# Patient Record
Sex: Female | Born: 1945
Health system: Southern US, Community
[De-identification: ages and names within clinical notes are randomized; demographics above are authoritative.]

## PROBLEM LIST (undated history)

## (undated) DIAGNOSIS — E119 Type 2 diabetes mellitus without complications: Secondary | ICD-10-CM

## (undated) DIAGNOSIS — G4700 Insomnia, unspecified: Secondary | ICD-10-CM

## (undated) DIAGNOSIS — Z87442 Personal history of urinary calculi: Secondary | ICD-10-CM

## (undated) DIAGNOSIS — M1990 Unspecified osteoarthritis, unspecified site: Secondary | ICD-10-CM

## (undated) DIAGNOSIS — D649 Anemia, unspecified: Secondary | ICD-10-CM

## (undated) DIAGNOSIS — A809 Acute poliomyelitis, unspecified: Secondary | ICD-10-CM

## (undated) DIAGNOSIS — G4730 Sleep apnea, unspecified: Secondary | ICD-10-CM

## (undated) DIAGNOSIS — I10 Essential (primary) hypertension: Secondary | ICD-10-CM

## (undated) DIAGNOSIS — M810 Age-related osteoporosis without current pathological fracture: Secondary | ICD-10-CM

## (undated) HISTORY — DX: Essential (primary) hypertension: I10

## (undated) HISTORY — DX: Insomnia, unspecified: G47.00

## (undated) HISTORY — DX: Age-related osteoporosis without current pathological fracture: M81.0

## (undated) HISTORY — DX: Anemia, unspecified: D64.9

## (undated) HISTORY — DX: Type 2 diabetes mellitus without complications: E11.9

## (undated) HISTORY — DX: Acute poliomyelitis, unspecified: A80.9

---

## 2001-01-04 ENCOUNTER — Other Ambulatory Visit: Admission: RE | Admit: 2001-01-04 | Discharge: 2001-01-04 | Payer: Self-pay | Admitting: *Deleted

## 2001-07-15 ENCOUNTER — Emergency Department (HOSPITAL_COMMUNITY): Admission: EM | Admit: 2001-07-15 | Discharge: 2001-07-15 | Payer: Self-pay | Admitting: Emergency Medicine

## 2003-03-29 ENCOUNTER — Other Ambulatory Visit: Admission: RE | Admit: 2003-03-29 | Discharge: 2003-03-29 | Payer: Self-pay | Admitting: Gynecology

## 2003-04-15 ENCOUNTER — Encounter: Payer: Self-pay | Admitting: Gynecology

## 2003-04-15 ENCOUNTER — Ambulatory Visit (HOSPITAL_COMMUNITY): Admission: RE | Admit: 2003-04-15 | Discharge: 2003-04-15 | Payer: Self-pay | Admitting: Gynecology

## 2003-04-24 ENCOUNTER — Encounter: Payer: Self-pay | Admitting: Gynecology

## 2003-04-24 ENCOUNTER — Ambulatory Visit (HOSPITAL_COMMUNITY): Admission: RE | Admit: 2003-04-24 | Discharge: 2003-04-24 | Payer: Self-pay | Admitting: Gynecology

## 2003-07-04 ENCOUNTER — Ambulatory Visit (HOSPITAL_COMMUNITY): Admission: RE | Admit: 2003-07-04 | Discharge: 2003-07-04 | Payer: Self-pay | Admitting: Oncology

## 2003-07-18 ENCOUNTER — Ambulatory Visit (HOSPITAL_COMMUNITY): Admission: RE | Admit: 2003-07-18 | Discharge: 2003-07-18 | Payer: Self-pay | Admitting: Oncology

## 2004-04-02 ENCOUNTER — Other Ambulatory Visit: Admission: RE | Admit: 2004-04-02 | Discharge: 2004-04-02 | Payer: Self-pay | Admitting: Gynecology

## 2005-03-25 ENCOUNTER — Emergency Department (HOSPITAL_COMMUNITY): Admission: EM | Admit: 2005-03-25 | Discharge: 2005-03-25 | Payer: Self-pay | Admitting: Emergency Medicine

## 2006-07-02 ENCOUNTER — Ambulatory Visit (HOSPITAL_COMMUNITY): Admission: RE | Admit: 2006-07-02 | Discharge: 2006-07-02 | Payer: Self-pay | Admitting: Neurosurgery

## 2006-07-12 ENCOUNTER — Ambulatory Visit (HOSPITAL_COMMUNITY): Admission: RE | Admit: 2006-07-12 | Discharge: 2006-07-12 | Payer: Self-pay | Admitting: Neurosurgery

## 2006-10-25 ENCOUNTER — Encounter: Admission: RE | Admit: 2006-10-25 | Discharge: 2006-10-25 | Payer: Self-pay | Admitting: Neurosurgery

## 2008-07-20 HISTORY — PX: SHOULDER SURGERY: SHX246

## 2010-08-10 ENCOUNTER — Encounter: Payer: Self-pay | Admitting: Neurosurgery

## 2010-08-10 ENCOUNTER — Encounter: Payer: Self-pay | Admitting: Oncology

## 2014-03-01 ENCOUNTER — Other Ambulatory Visit: Payer: Self-pay | Admitting: Vascular Surgery

## 2014-03-01 ENCOUNTER — Encounter: Payer: Self-pay | Admitting: Vascular Surgery

## 2014-03-01 DIAGNOSIS — M7989 Other specified soft tissue disorders: Secondary | ICD-10-CM

## 2014-03-12 ENCOUNTER — Encounter: Payer: Self-pay | Admitting: Vascular Surgery

## 2014-03-13 ENCOUNTER — Ambulatory Visit (INDEPENDENT_AMBULATORY_CARE_PROVIDER_SITE_OTHER): Payer: Medicare Other | Admitting: Vascular Surgery

## 2014-03-13 ENCOUNTER — Ambulatory Visit (HOSPITAL_COMMUNITY)
Admission: RE | Admit: 2014-03-13 | Discharge: 2014-03-13 | Disposition: A | Discharge status: Home / Self Care | Payer: Medicare Other | Source: Ambulatory Visit | Attending: Vascular Surgery | Admitting: Vascular Surgery

## 2014-03-13 ENCOUNTER — Encounter: Payer: Self-pay | Admitting: Vascular Surgery

## 2014-03-13 VITALS — BP 128/72 | HR 59 | Resp 16 | Ht 60.0 in | Wt 154.7 lb

## 2014-03-13 DIAGNOSIS — M7989 Other specified soft tissue disorders: Secondary | ICD-10-CM

## 2014-03-13 DIAGNOSIS — R609 Edema, unspecified: Secondary | ICD-10-CM | POA: Insufficient documentation

## 2014-03-13 NOTE — Progress Notes (Signed)
Patient name: Jodi [name] MRN: [id number] DOB: Dec 15, 1945 Sex: female   Referred by: Jimmye Norman  Reason for referral:  Chief Complaint  Patient presents with  . New Evaluation    c/o left leg swelling for 1 month, c/o nocturnal left leg cramping ,  c/o left leg heaviness during the day  , spanish interpreter in room  assisting  with communication      HISTORY OF PRESENT ILLNESS: Patient returns today for dilation of swelling in her left foot. She does not speak much English but we do have an interpreter here today to help with her visit. She has a picture on her cell phone of her left foot several weeks ago. She does have some erythema on the dorsum and significant swelling in her foot specifically at that time. She denies any swelling in her calf and ankle orthotic. This has resolved most part she does report occasional pain in her arch her upper left foot as well. She has a separate issue regarding nocturnal leg cramps. She does not have any history of lower external ureter insufficiency. She denies claudication type symptoms and no history of arterial rest pain. No history of tissue loss.  Past Medical History  Diagnosis Date  . Hypertension   . Anemia   . Diabetes mellitus without complication   . Insomnia     Past Surgical History  Procedure Laterality Date  . Cesarean section    . Shoulder surgery Left 2010    History   Social History  . Marital Status: Single    Spouse Name: N/A    Number of Children: N/A  . Years of Education: N/A   Occupational History  . Not on file.   Social History Main Topics  . Smoking status: Never Smoker   . Smokeless tobacco: Not on file  . Alcohol Use: No  . Drug Use: No  . Sexual Activity: Not on file   Other Topics Concern  . Not on file   Social History Narrative  . No narrative on file    History reviewed. No pertinent family history.  Allergies as of 03/13/2014 - Review Complete 03/13/2014  Allergen Reaction Noted    . Cortisone  03/13/2014  . Pork-derived products  03/13/2014  . Shrimp [shellfish allergy]  03/13/2014    Current Outpatient Prescriptions on File Prior to Visit  Medication Sig Dispense Refill  . glipiZIDE (GLUCOTROL XL) 2.5 MG 24 hr tablet Take 2.5 mg by mouth daily with breakfast.      . lisinopril (PRINIVIL,ZESTRIL) 5 MG tablet Take 5 mg by mouth daily.      Marland Kitchen escitalopram (LEXAPRO) 10 MG tablet Take 10 mg by mouth daily.       No current facility-administered medications on file prior to visit.     REVIEW OF SYSTEMS:  Positives indicated with an "X"  CARDIOVASCULAR:  [ ]  chest pain   [ ]  chest pressure   [ ]  palpitations   [ ]  orthopnea   [ ]  dyspnea on exertion   [ ]  claudication   [ ]  rest pain   [ ]  DVT   [ ]  phlebitis PULMONARY:   [ ]  productive cough   [ ]  asthma   [ ]  wheezing NEUROLOGIC:   [ ]  weakness  [ ]  paresthesias  [ ]  aphasia  [ ]  amaurosis  [ ]  dizziness HEMATOLOGIC:   [ ]  bleeding problems   [ ]  clotting disorders MUSCULOSKELETAL:  [ ]  joint  pain   [ ]  joint swelling GASTROINTESTINAL: [ ]   blood in stool  [ ]   hematemesis GENITOURINARY:  [ ]   dysuria  [ ]   hematuria PSYCHIATRIC:  [ ]  history of major depression INTEGUMENTARY:  [ ]  rashes  [ ]  ulcers CONSTITUTIONAL:  [ ]  fever   [ ]  chills  PHYSICAL EXAMINATION:  General: The patient is a well-nourished female, in no acute distress. Vital signs are BP 128/72  Pulse 59  Resp 16  Ht 5' (1.524 m)  Wt 154 lb 11.2 oz (70.171 kg)  BMI 30.21 kg/m2 Pulmonary: There is a good air exchange   Musculoskeletal: There are no major deformities.  There is no significant extremity pain. Neurologic: No focal weakness or paresthesias are detected, Skin: There are no ulcer or rashes noted. Psychiatric: The patient has normal affect. Cardiovascular: 2+ dorsalis pedis pulses bilaterally Scattered reticular veins bilaterally but no true varicosities. No swelling or changes of venous hypertension   [facility]  Studies:  Ordered and Independently Reviewed no evidence of deep venous reflux. There is slight reflux of the left saphenofemoral junction but no reflux throughout the saphenous vein  Impression and Plan:  Discuss these findings through the interpreter. She has no evidence of any arterial insufficiency and no evidence of venous insufficiency or hypertension. The swelling and specifically in her foot only. I suspect this is more related to orthopedic issues. This has resolved to a great degree for her. I explained if she should have persistent discomfort would recommend orthopedic evaluation for further care. She was relieved with this discussion and will see [place] again on an as-needed basis    [facility] Office: [phone]

## 2014-07-02 ENCOUNTER — Other Ambulatory Visit: Payer: Self-pay | Admitting: Gastroenterology

## 2014-07-02 DIAGNOSIS — R059 Cough, unspecified: Secondary | ICD-10-CM

## 2014-07-02 DIAGNOSIS — K449 Diaphragmatic hernia without obstruction or gangrene: Secondary | ICD-10-CM

## 2014-07-02 DIAGNOSIS — R05 Cough: Secondary | ICD-10-CM

## 2014-07-05 ENCOUNTER — Ambulatory Visit
Admission: RE | Admit: 2014-07-05 | Discharge: 2014-07-05 | Disposition: A | Discharge status: Home / Self Care | Payer: Medicare Other | Source: Ambulatory Visit | Attending: Gastroenterology | Admitting: Gastroenterology

## 2014-07-05 DIAGNOSIS — K449 Diaphragmatic hernia without obstruction or gangrene: Secondary | ICD-10-CM

## 2014-07-05 DIAGNOSIS — R059 Cough, unspecified: Secondary | ICD-10-CM

## 2014-07-05 DIAGNOSIS — R05 Cough: Secondary | ICD-10-CM

## 2014-09-11 DIAGNOSIS — K449 Diaphragmatic hernia without obstruction or gangrene: Secondary | ICD-10-CM | POA: Diagnosis not present

## 2014-09-11 DIAGNOSIS — K219 Gastro-esophageal reflux disease without esophagitis: Secondary | ICD-10-CM | POA: Diagnosis not present

## 2014-10-08 DIAGNOSIS — R739 Hyperglycemia, unspecified: Secondary | ICD-10-CM | POA: Diagnosis not present

## 2014-10-08 DIAGNOSIS — E785 Hyperlipidemia, unspecified: Secondary | ICD-10-CM | POA: Diagnosis not present

## 2014-10-08 DIAGNOSIS — E119 Type 2 diabetes mellitus without complications: Secondary | ICD-10-CM | POA: Diagnosis not present

## 2014-10-08 DIAGNOSIS — R32 Unspecified urinary incontinence: Secondary | ICD-10-CM | POA: Diagnosis not present

## 2014-10-08 DIAGNOSIS — K449 Diaphragmatic hernia without obstruction or gangrene: Secondary | ICD-10-CM | POA: Diagnosis not present

## 2014-10-08 DIAGNOSIS — I10 Essential (primary) hypertension: Secondary | ICD-10-CM | POA: Diagnosis not present

## 2014-11-26 DIAGNOSIS — E119 Type 2 diabetes mellitus without complications: Secondary | ICD-10-CM | POA: Diagnosis not present

## 2014-11-26 DIAGNOSIS — K449 Diaphragmatic hernia without obstruction or gangrene: Secondary | ICD-10-CM | POA: Diagnosis not present

## 2014-11-26 DIAGNOSIS — I10 Essential (primary) hypertension: Secondary | ICD-10-CM | POA: Diagnosis not present

## 2014-11-26 DIAGNOSIS — R739 Hyperglycemia, unspecified: Secondary | ICD-10-CM | POA: Diagnosis not present

## 2014-11-26 DIAGNOSIS — R32 Unspecified urinary incontinence: Secondary | ICD-10-CM | POA: Diagnosis not present

## 2014-11-26 DIAGNOSIS — E785 Hyperlipidemia, unspecified: Secondary | ICD-10-CM | POA: Diagnosis not present

## 2014-11-26 DIAGNOSIS — R252 Cramp and spasm: Secondary | ICD-10-CM | POA: Diagnosis not present

## 2014-12-27 DIAGNOSIS — Z1231 Encounter for screening mammogram for malignant neoplasm of breast: Secondary | ICD-10-CM | POA: Diagnosis not present

## 2015-01-01 DIAGNOSIS — R5382 Chronic fatigue, unspecified: Secondary | ICD-10-CM | POA: Diagnosis not present

## 2015-01-01 DIAGNOSIS — I10 Essential (primary) hypertension: Secondary | ICD-10-CM | POA: Diagnosis not present

## 2015-01-01 DIAGNOSIS — E785 Hyperlipidemia, unspecified: Secondary | ICD-10-CM | POA: Diagnosis not present

## 2015-01-01 DIAGNOSIS — E119 Type 2 diabetes mellitus without complications: Secondary | ICD-10-CM | POA: Diagnosis not present

## 2015-01-10 DIAGNOSIS — R32 Unspecified urinary incontinence: Secondary | ICD-10-CM | POA: Diagnosis not present

## 2015-01-10 DIAGNOSIS — I10 Essential (primary) hypertension: Secondary | ICD-10-CM | POA: Diagnosis not present

## 2015-01-10 DIAGNOSIS — R739 Hyperglycemia, unspecified: Secondary | ICD-10-CM | POA: Diagnosis not present

## 2015-01-10 DIAGNOSIS — E785 Hyperlipidemia, unspecified: Secondary | ICD-10-CM | POA: Diagnosis not present

## 2015-01-10 DIAGNOSIS — K449 Diaphragmatic hernia without obstruction or gangrene: Secondary | ICD-10-CM | POA: Diagnosis not present

## 2015-01-10 DIAGNOSIS — E119 Type 2 diabetes mellitus without complications: Secondary | ICD-10-CM | POA: Diagnosis not present

## 2015-01-10 DIAGNOSIS — R5382 Chronic fatigue, unspecified: Secondary | ICD-10-CM | POA: Diagnosis not present

## 2015-02-04 DIAGNOSIS — M79672 Pain in left foot: Secondary | ICD-10-CM | POA: Diagnosis not present

## 2015-02-04 DIAGNOSIS — M71572 Other bursitis, not elsewhere classified, left ankle and foot: Secondary | ICD-10-CM | POA: Diagnosis not present

## 2015-02-04 DIAGNOSIS — M25572 Pain in left ankle and joints of left foot: Secondary | ICD-10-CM | POA: Diagnosis not present

## 2015-02-04 DIAGNOSIS — M2012 Hallux valgus (acquired), left foot: Secondary | ICD-10-CM | POA: Diagnosis not present

## 2015-02-11 DIAGNOSIS — M7989 Other specified soft tissue disorders: Secondary | ICD-10-CM | POA: Diagnosis not present

## 2015-02-11 DIAGNOSIS — M65872 Other synovitis and tenosynovitis, left ankle and foot: Secondary | ICD-10-CM | POA: Diagnosis not present

## 2015-06-10 DIAGNOSIS — I10 Essential (primary) hypertension: Secondary | ICD-10-CM | POA: Diagnosis not present

## 2015-06-10 DIAGNOSIS — J069 Acute upper respiratory infection, unspecified: Secondary | ICD-10-CM | POA: Diagnosis not present

## 2015-06-10 DIAGNOSIS — E119 Type 2 diabetes mellitus without complications: Secondary | ICD-10-CM | POA: Diagnosis not present

## 2015-06-10 DIAGNOSIS — K219 Gastro-esophageal reflux disease without esophagitis: Secondary | ICD-10-CM | POA: Diagnosis not present

## 2015-06-10 DIAGNOSIS — E785 Hyperlipidemia, unspecified: Secondary | ICD-10-CM | POA: Diagnosis not present

## 2015-08-26 DIAGNOSIS — E785 Hyperlipidemia, unspecified: Secondary | ICD-10-CM | POA: Diagnosis not present

## 2015-08-26 DIAGNOSIS — K449 Diaphragmatic hernia without obstruction or gangrene: Secondary | ICD-10-CM | POA: Diagnosis not present

## 2015-08-26 DIAGNOSIS — E119 Type 2 diabetes mellitus without complications: Secondary | ICD-10-CM | POA: Diagnosis not present

## 2015-08-26 DIAGNOSIS — I10 Essential (primary) hypertension: Secondary | ICD-10-CM | POA: Diagnosis not present

## 2015-11-04 DIAGNOSIS — M24872 Other specific joint derangements of left ankle, not elsewhere classified: Secondary | ICD-10-CM | POA: Diagnosis not present

## 2015-11-04 DIAGNOSIS — M65872 Other synovitis and tenosynovitis, left ankle and foot: Secondary | ICD-10-CM | POA: Diagnosis not present

## 2015-11-04 DIAGNOSIS — M25572 Pain in left ankle and joints of left foot: Secondary | ICD-10-CM | POA: Diagnosis not present

## 2016-04-02 ENCOUNTER — Encounter: Payer: Self-pay | Admitting: Internal Medicine

## 2016-04-02 ENCOUNTER — Ambulatory Visit: Payer: Medicare Other | Attending: Internal Medicine | Admitting: Internal Medicine

## 2016-04-02 ENCOUNTER — Encounter (INDEPENDENT_AMBULATORY_CARE_PROVIDER_SITE_OTHER): Payer: Self-pay

## 2016-04-02 VITALS — BP 144/80 | HR 67 | Temp 97.7°F | Resp 16 | Ht 60.0 in | Wt 142.6 lb

## 2016-04-02 DIAGNOSIS — Z1329 Encounter for screening for other suspected endocrine disorder: Secondary | ICD-10-CM | POA: Insufficient documentation

## 2016-04-02 DIAGNOSIS — I10 Essential (primary) hypertension: Secondary | ICD-10-CM | POA: Insufficient documentation

## 2016-04-02 DIAGNOSIS — Z23 Encounter for immunization: Secondary | ICD-10-CM | POA: Insufficient documentation

## 2016-04-02 DIAGNOSIS — Z118 Encounter for screening for other infectious and parasitic diseases: Secondary | ICD-10-CM | POA: Insufficient documentation

## 2016-04-02 DIAGNOSIS — Z888 Allergy status to other drugs, medicaments and biological substances status: Secondary | ICD-10-CM | POA: Diagnosis not present

## 2016-04-02 DIAGNOSIS — Z79899 Other long term (current) drug therapy: Secondary | ICD-10-CM | POA: Diagnosis not present

## 2016-04-02 DIAGNOSIS — M79672 Pain in left foot: Secondary | ICD-10-CM | POA: Diagnosis not present

## 2016-04-02 DIAGNOSIS — D649 Anemia, unspecified: Secondary | ICD-10-CM | POA: Diagnosis not present

## 2016-04-02 DIAGNOSIS — Z114 Encounter for screening for human immunodeficiency virus [HIV]: Secondary | ICD-10-CM

## 2016-04-02 DIAGNOSIS — Z1159 Encounter for screening for other viral diseases: Secondary | ICD-10-CM

## 2016-04-02 DIAGNOSIS — M545 Low back pain: Secondary | ICD-10-CM | POA: Insufficient documentation

## 2016-04-02 DIAGNOSIS — R252 Cramp and spasm: Secondary | ICD-10-CM | POA: Diagnosis not present

## 2016-04-02 DIAGNOSIS — E119 Type 2 diabetes mellitus without complications: Secondary | ICD-10-CM

## 2016-04-02 DIAGNOSIS — E11610 Type 2 diabetes mellitus with diabetic neuropathic arthropathy: Secondary | ICD-10-CM | POA: Diagnosis not present

## 2016-04-02 LAB — CMP AND LIVER
ALT: 15 U/L (ref 6–29)
AST: 19 U/L (ref 10–35)
Albumin: 3.9 g/dL (ref 3.6–5.1)
Alkaline Phosphatase: 82 U/L (ref 33–130)
BILIRUBIN DIRECT: 0.1 mg/dL (ref <=0.2)
BUN: 17 mg/dL (ref 7–25)
CALCIUM: 9.1 mg/dL (ref 8.6–10.4)
CHLORIDE: 106 mmol/L (ref 98–110)
CO2: 23 mmol/L (ref 20–31)
Creat: 0.64 mg/dL (ref 0.60–0.93)
Glucose, Bld: 105 mg/dL — ABNORMAL HIGH (ref 65–99)
Indirect Bilirubin: 0.3 mg/dL (ref 0.2–1.2)
POTASSIUM: 4.5 mmol/L (ref 3.5–5.3)
Sodium: 139 mmol/L (ref 135–146)
Total Bilirubin: 0.4 mg/dL (ref 0.2–1.2)
Total Protein: 7.1 g/dL (ref 6.1–8.1)

## 2016-04-02 LAB — CBC WITH DIFFERENTIAL/PLATELET
Basophils Absolute: 52 cells/uL (ref 0–200)
Basophils Relative: 1 %
Eosinophils Absolute: 104 cells/uL (ref 15–500)
Eosinophils Relative: 2 %
HEMATOCRIT: 23.9 % — AB (ref 35.0–45.0)
Hemoglobin: 6.8 g/dL — ABNORMAL LOW (ref 11.7–15.5)
LYMPHS PCT: 30 %
Lymphs Abs: 1560 cells/uL (ref 850–3900)
MCH: 19.8 pg — ABNORMAL LOW (ref 27.0–33.0)
MCHC: 28.7 g/dL — AB (ref 32.0–36.0)
MCV: 69.5 fL — AB (ref 80.0–100.0)
MONO ABS: 312 {cells}/uL (ref 200–950)
MONOS PCT: 6 %
MPV: 8.8 fL (ref 7.5–12.5)
NEUTROS PCT: 61 %
Neutro Abs: 3172 cells/uL (ref 1500–7800)
PLATELETS: 509 10*3/uL — AB (ref 140–400)
RBC: 3.43 MIL/uL — AB (ref 3.80–5.10)
RDW: 19.3 % — AB (ref 11.0–15.0)
WBC: 5.2 10*3/uL (ref 3.8–10.8)

## 2016-04-02 LAB — LIPID PANEL
CHOL/HDL RATIO: 2.8 ratio (ref <=5.0)
Cholesterol: 205 mg/dL — ABNORMAL HIGH (ref 125–200)
HDL: 74 mg/dL (ref >=46)
LDL Cholesterol: 110 mg/dL (ref <130)
Triglycerides: 106 mg/dL (ref <150)
VLDL: 21 mg/dL (ref <30)

## 2016-04-02 LAB — TSH: TSH: 0.79 m[IU]/L

## 2016-04-02 LAB — POCT GLYCOSYLATED HEMOGLOBIN (HGB A1C): Hemoglobin A1C: 6.1

## 2016-04-02 LAB — GLUCOSE, POCT (MANUAL RESULT ENTRY): POC Glucose: 113 mg/dl — AB (ref 70–99)

## 2016-04-02 MED ORDER — LUMBAR BACK BRACE/SUPPORT PAD MISC: 1.0000 "application " | Freq: Every day | 0 refills | Status: DC

## 2016-04-02 MED ORDER — SOLIFENACIN SUCCINATE 10 MG PO TABS: 10.0000 mg | ORAL_TABLET | Freq: Every day | ORAL | 2 refills | Status: DC

## 2016-04-02 MED ORDER — GABAPENTIN 100 MG PO CAPS: 100.0000 mg | ORAL_CAPSULE | Freq: Every day | ORAL | 3 refills | Status: DC

## 2016-04-02 MED ORDER — IBUPROFEN 800 MG PO TABS: 800.0000 mg | ORAL_TABLET | Freq: Three times a day (TID) | ORAL | 1 refills | Status: DC | PRN

## 2016-04-02 MED ORDER — METFORMIN HCL 500 MG PO TABS: 500.0000 mg | ORAL_TABLET | Freq: Two times a day (BID) | ORAL | 3 refills | Status: DC

## 2016-04-02 MED ORDER — VALSARTAN 40 MG PO TABS: 40.0000 mg | ORAL_TABLET | Freq: Every day | ORAL | 2 refills | Status: DC

## 2016-04-02 NOTE — Progress Notes (Signed)
Jodi [name], is a 70 y.o. female  [id number]  [id number]  DOB - 04-06-1946  CC:  Chief Complaint  Patient presents with  . Establish Care       HPI: Jodi [name] is a 70 y.o. female here today to establish medical care, new to our clinic, w/ hx of htn, anemia, dm, insomnia, chronic back pain. She use to go to [facility] [place] in [place] clinic, but they have since closed.  She is doing well, no acute c/o, accept her chronic lower back pain for which she requested a lumbar support waist belt and renew rx for ibuprofen 800 for which she takes prn for it.  Does not smoke or drink. Never recalls having any recent immunizations.  Currently fasting. Never had eye exam prior.  She use to see foot doctor who gave her steroid injection on her foot, which helped with the left foot pain, but she does c/o of most nights having severe bilat leg cramps which are very painful.  Patient has No headache, No chest pain, No abdominal pain - No Nausea, No new weakness tingling or numbness, No Cough - SOB.  Interpreter was used to communicate directly with patient for the entire encounter including providing detailed patient instructions.    Review of Systems: Per HPI, o/w all systems reviewed and negative.  Allergies  Allergen Reactions  . Cortisone     Rash, swelling  . Pork-Derived Products     Rash, joint pain  . Shrimp [Shellfish Allergy]     Rash, joint pain, GI pain   Past Medical History:  Diagnosis Date  . Anemia   . Diabetes mellitus without complication ([place])   . Hypertension   . Insomnia    Current Outpatient Prescriptions on File Prior to Visit  Medication Sig Dispense Refill  . dexlansoprazole (DEXILANT) 60 MG capsule Take 60 mg by mouth daily.    . bisoprolol-hydrochlorothiazide (ZIAC) 5-6.25 MG per tablet Take 1 tablet by mouth daily.    Marland Kitchen escitalopram (LEXAPRO) 10 MG tablet Take 10 mg by mouth daily.    Marland Kitchen glipiZIDE (GLUCOTROL XL) 2.5 MG 24 hr tablet  Take 2.5 mg by mouth daily with breakfast.    . Iron-FA-B Cmp-C-Biot-Probiotic (FUSION PLUS) CAPS Take by mouth daily.    Marland Kitchen VITAMIN B1-B12 IJ Inject as directed once a week.     No current facility-administered medications on file prior to visit.    No family history on file. Social History   Social History  . Marital status: Single    Spouse name: N/A  . Number of children: N/A  . Years of education: N/A   Occupational History  . Not on file.   Social History Main Topics  . Smoking status: Never Smoker  . Smokeless tobacco: Not on file  . Alcohol use No  . Drug use: No  . Sexual activity: Not on file   Other Topics Concern  . Not on file   Social History Narrative  . No narrative on file    Objective:   Vitals:   04/02/16 1006  BP: (!) 144/80  Pulse: 67  Resp: 16  Temp: 97.7 F (36.5 C)    Filed Weights   04/02/16 1006  Weight: 142 lb 9.6 oz (64.7 kg)    BP Readings from Last 3 Encounters:  04/02/16 (!) 144/80  03/13/14 128/72    Physical Exam: Constitutional: Patient appears well-developed and well-nourished. No distress. AAOx3, pleasant. HENT: Normocephalic, atraumatic, External right and left ear  normal. Oropharynx is clear and moist. bilat TMs clear. Eyes: Conjunctivae and EOM are normal. PERRL, no scleral icterus. Neck: Normal ROM. Neck supple. No JVD.  CVS: RRR, S1/S2 +, no murmurs, no gallops, no carotid bruit.  Pulmonary: Effort and breath sounds normal, no stridor, rhonchi, wheezes, rales.  Abdominal: Soft. BS +, no distension, tenderness, rebound or guarding.  Musculoskeletal: Normal range of motion. No edema and no tenderness.  Foot exam: bilateral peripheral pulses 2+ (dorsalis pedis and post tibialis pulses), no ulcers noted/no ecchymosis, warm to touch, decrease sensation bilaterally, left foot > right. No c/c/e. Neuro: Alert. muscle tone coordination wnl. No cranial nerve deficit grossly. Skin: Skin is warm and dry. No rash noted. Not  diaphoretic. No erythema. No pallor. Psychiatric: Normal mood and affect. Behavior, judgment, thought content normal.  No results found for: WBC, HGB, HCT, MCV, PLT No results found for: CREATININE, BUN, NA, K, CL, CO2  Lab Results  Component Value Date   HGBA1C 6.1 04/02/2016   Lipid Panel  No results found for: CHOL, TRIG, HDL, CHOLHDL, VLDL, LDLCALC     Depression screen [facility] 2/9 04/02/2016  Decreased Interest 3  Down, Depressed, Hopeless 3  PHQ - 2 Score 6  Altered sleeping 3  Tired, decreased energy 3  Change in appetite 2  Feeling bad or failure about yourself  3  Trouble concentrating 3  Moving slowly or fidgety/restless 2  Suicidal thoughts 0  PHQ-9 Score 22    Assessment and plan:   1. Type 2 diabetes mellitus without complication, without long-term current use of insulin (HCC) - currently in Predm range,  - Glucose (CBG) 113 - HgB A1c 6.1 - CMP and Liver - TSH - Ambulatory referral to Ophthalmology  2. Flu vaccine need - Flu Vaccine QUAD 36+ mos PF IM (Fluarix & Fluzone Quad PF)  3. HTN (hypertension), benign Meeting goals for current guidelines,  <150/90 - renewed diovan 40qd - Lipid Panel - CBC with Differential  4. Need for hepatitis C screening test - Hepatitis C antibody  5. Thyroid disorder screen - TSH  6. Diabetic neuropathic arthritis ([place]) ? W/ co of muscle cramps at night - trial neurontin 100qsh - chk electrolytes /cmp  - chk TSH  7. Encounter for screening for HIV - HIV antibody (with reflex)  8. Midline low back pain, with sciatica presence unspecified Suspect back strain/lordosis, exercise/back stretching exercises provided - encouraged to take motrin 800 prn w/ food - back brace rx provided as well.  9. tdap and pneumovac 23v today as well.  Return in about 3 months (around 07/02/2016).  The patient was given clear instructions to go to ER or return to medical center if symptoms don't improve, worsen or new problems  develop. The patient verbalized understanding. The patient was told to call to get lab results if they haven't heard anything in the next week.    This note has been created with Surveyor, quantity. Any transcriptional errors are unintentional.   Maren Reamer, MD, [facility], [place]   04/02/2016, 12:37 PM

## 2016-04-02 NOTE — Progress Notes (Signed)
Pt is in the office today for establish care Pt states she is not in any pain Pt states she feels tired Pt states she went to see an orthopedic doctor and they told her she has something wrong with her back and that's why she has back pain

## 2016-04-02 NOTE — Patient Instructions (Addendum)
La diabetes mellitus y los alimentos (Diabetes Mellitus and Food) Es importante que controle su nivel de azcar en la sangre (glucosa). El nivel de glucosa en sangre depende en gran medida de lo que usted come. Comer alimentos saludables en las cantidades [place] a lo largo del Training and development officer, aproximadamente a la misma hora [employer], lo ayudar a Chief Technology Officer su nivel de Multimedia programmer. Tambin puede ayudarlo a retrasar o Patent attorney de la diabetes mellitus. Comer de [employer] saludable incluso puede ayudarlo a Chartered loss adjuster de presin arterial y a Science writer o Theatre manager un peso saludable.  Entre las recomendaciones generales para alimentarse y Audiological scientist los alimentos de forma saludable, se incluyen las siguientes:  Respetar las comidas principales y comer colaciones con regularidad. Evitar pasar largos perodos sin comer con el fin de perder peso.  Seguir una dieta que consista principalmente en alimentos de origen vegetal, como frutas, vegetales, frutos secos, legumbres y cereales integrales.  Utilizar mtodos de coccin a baja temperatura, como hornear, en lugar de mtodos de coccin a alta temperatura, como frer en abundante aceite. Trabaje con el nutricionista para aprender a Financial planner nutricional de las etiquetas de los alimentos. CMO PUEDEN AFECTARME LOS ALIMENTOS? Carbohidratos Los carbohidratos afectan el nivel de glucosa en sangre ms que cualquier otro tipo de alimento. El nutricionista lo ayudar a Teacher, adult education cuntos carbohidratos puede consumir en cada comida y ensearle a contarlos. El recuento de carbohidratos es importante para mantener la glucosa en sangre en un nivel saludable, en especial si utiliza insulina o toma determinados medicamentos para la diabetes mellitus. Alcohol El alcohol puede provocar disminuciones sbitas de la glucosa en sangre (hipoglucemia), en especial si utiliza insulina o toma determinados medicamentos para la diabetes mellitus. La  hipoglucemia es una afeccin que puede poner en peligro la vida. Los sntomas de la hipoglucemia (somnolencia, mareos y Data processing manager) son similares a los sntomas de haber consumido mucho alcohol.  Si el mdico lo autoriza a beber alcohol, hgalo con moderacin y siga estas pautas:  Las mujeres no deben beber ms de un trago por da, y los hombres no deben beber ms de dos tragos por Training and development officer. Un trago es igual a:  12 onzas (355 ml) de cerveza  5 onzas de vino (150 ml) de vino  1,5onzas (23m) de bebidas espirituosas  No beba con el estmago vaco.  Mantngase hidratado. Beba agua, gaseosas dietticas o t helado sin azcar.  Las gaseosas comunes, los jugos y otros refrescos podran contener muchos carbohidratos y se dCivil Service fast streamer QU ALIMENTOS NO SE RECOMIENDAN? Cuando haga las elecciones de alimentos, es importante que recuerde que todos los alimentos son distintos. Algunos tienen menos nutrientes que otros por porcin, aunque podran tener la misma cantidad de caloras o carbohidratos. Es difcil darle al cuerpo lo que necesita cuando consume alimentos con menos nutrientes. Estos son algunos ejemplos de alimentos que debera evitar ya que contienen muchas caloras y carbohidratos, pero pocos nutrientes:  GPhysicist, medicaltrans (la mayora de los alimentos procesados incluyen grasas trans en la etiqueta de Informacin nutricional).  Gaseosas comunes.  Jugos.  Caramelos.  Dulces, como tortas, pasteles, rosquillas y g[place]  Comidas fritas. QU ALIMENTOS PUEDO COMER? Consuma alimentos ricos en nutrientes, que nutrirn el cuerpo y lo mantendrn saludable. Los alimentos que debe comer tambin dependern de varios factores, como:  Las caloras que necesita.  Los medicamentos que toma.  Su peso.  El nivel de glucosa en sMarist College  El nArrow Rockde presin arterial.  El nivel de colesterol.  Debe consumir una amplia variedad de alimentos, por ejemplo:  Protenas.  Cortes de [employer].  Protenas con bajo contenido de grasas saturadas, como pescado, clara de huevo y frijoles. Evite las carnes procesadas.  Frutas y vegetales.  Frutas y Photographer que pueden ayudar a Chief Technology Officer los niveles sanguneos de [place], como [place], mangos y batatas.  Productos lcteos.  Elija productos lcteos sin grasa o con bajo contenido de [place], como [place], yogur y [place].  Cereales, panes, pastas y arroz.  Elija cereales integrales, como panes multicereales, avena en grano y arroz integral. Estos alimentos pueden ayudar a controlar la presin arterial.  Daphene Jaeger.  Alimentos que contengan grasas saludables, como frutos secos, Musician, aceite de [place], aceite de canola y pescado. TODOS LOS QUE PADECEN DIABETES MELLITUS TIENEN EL [place] PLAN DE [place]? Dado que todas las personas que padecen diabetes mellitus son distintas, no hay un solo plan de comidas que funcione para todos. Es muy importante que se rena con un nutricionista que lo ayudar a crear un plan de comidas adecuado para usted.   Esta informacin no tiene Marine scientist el consejo del mdico. Asegrese de hacerle al mdico cualquier pregunta que tenga.   Document Released: 10/13/2007 Document Revised: 07/27/2014 Elsevier Interactive Patient Education 2016 [place] para comer fuera de su casa si tiene diabetes (Tips for Eating Away From Home If You Have Diabetes) El control del nivel de glucemia, que tambin se conoce como azcar en la [place], puede ser un reto, que se complica an ms cuando uno no prepara sus propias comidas. Los siguientes consejos pueden ayudarlo a Chief Technology Officer la diabetes cuando come fuera de su casa. PLANIFICACIN Organcese si sabe que comer fuera de su casa:  Pregntele al mdico cmo sincronizar las comidas y el medicamento si est en tratamiento con insulina.  Haga una lista de restaurantes cercanos que ofrezcan opciones saludables. Si tienen un men que pueda leer en  su casa, llvelo y planifique lo que pedir con anticipacin.  Busque informacin en lnea del restaurante donde quiera comer. Muchos restaurantes de comida rpida y cadenas de restaurantes incluyen la informacin nutricional en lnea. Tenga en cuenta esta informacin para elegir las opciones ms saludables y calcular los carbohidratos de la comida.  Use un libro de recuento de carbohidratos o una aplicacin mvil para fijarse en el contenido de carbohidratos y el tamao de porcin de lo que desea comer.  Comience a Corporate treasurer de las porciones y a Marine scientist cuntas porciones hay en una unidad. Esto le permitir calcular la cantidad de carbohidratos que puede comer. ALIMENTOS LIBRES Un "alimento libre" es cualquier alimento o bebida que contenga menos de 5g de carbohidratos por porcin. Entre los alimentos libres, se incluyen los siguientes:  Muchos vegetales.  Huevos duros.  Nueces o semillas.  Aceitunas.  Quesos.  Carnes. Estos tipos de alimentos son buenas opciones de bocadillos y en general estn disponibles en los bufs de ensaladas. Como aderezos "libres" para [place], puede usarse jugo de limn, vinagre o un aderezo de bajas caloras (con menos de 20caloras por porcin).  OPCIONES PARA REDUCIR LOS CARBOHIDRATOS  Reemplace el yogur descremado endulzado por el yogur sin azcar. Tambin puede consumir yogur a base de [place] de [place], pero es conveniente una opcin sin azcar o natural, porque tiene menos contenido de carbohidratos.  Pdale al mozo que retire la canasta de pan o las papas de la mesa.  Pida frutas frescas. El buf de ensaladas a menudo ofrece frutas  frescas. Evite las frutas enlatadas, ya que por lo general tienen azcar o almbar.  Pida una ensalada y cmala sin aderezo. Tambin puede crear un aderezo "libre" para ensaladas.  Pida que le [employer] alimentos. Por ejemplo, en lugar de papas fritas, pida una porcin de vegetales, como una ensalada,  judas verdes o brcoli. OTROS CONSEJOS   Si [place] insulina, adminstrela una vez que la comida llegue a la mesa, as las Hotel manager.  Pregntele al mozo sobre el tamao de la porcin antes de pedir la comida y, si la porcin es ms grande de lo que usted debe consumir, pida una caja para llevarse la comida a su casa. Cuando llegue la comida, deje en el plato la cantidad que debe comer y coloque el resto en la caja para llevar.  Considere la posibilidad de Publishing rights manager un plato principal con alguien y de pedir una ensalada como guarnicin.   Esta informacin no tiene Marine scientist el consejo del mdico. Asegrese de hacerle al mdico cualquier pregunta que tenga.   Document Released: 07/06/2005 Document Revised: 03/27/2015 Elsevier Interactive Patient Education [employer].  -  Diabetes y actividad fsica (Diabetes and Exercise) Hacer actividad fsica con regularidad es muy importante. No se trata solo de [employer]. Tiene muchos otros beneficios, como por ejemplo:  Mejorar el estado fsico, la flexibilidad y la resistencia.  Aumenta la densidad sea.  Ayuda a Technical sales engineer.  Disminuye la Air traffic controller.  Aumenta la fuerza muscular.  Reduce el estrs y las tensiones.  Mejora el estado de salud general. Las personas diabticas que realizan actividad fsica tienen beneficios adicionales debido al ejercicio:  Reduce el apetito.  El organismo mejora el uso del azcar (glucosa) de la [place].  Ayuda a disminuir o Product/process development scientist.  Disminuye la presin arterial.  Ayuda a disminuir los lpidos en la sangre (colesterol y triglicridos).  El organismo mejora el uso de la insulina porque:  Aumenta la sensibilidad del organismo a la insulina.  Reduce las necesidades de insulina del organismo.  Disminuye el riesgo de enfermedad cardaca por la actividad fsica ya que  disminuye el colesterol y [employer] triglicridos.  Aumenta  los niveles de colesterol bueno (como las lipoprotenas de alta densidad [HDL]) en el organismo.  Disminuye los niveles de glucosa en la [place]. SU PLAN DE ACTIVIDAD  Elija una actividad que disfrute y establezca objetivos realistas. Para ejercitarse sin riesgos, debe comenzar a Psychologist, prison and probation services cualquier actividad fsica nueva lentamente y aumentar la intensidad del ejercicio de forma gradual con el tiempo. Su mdico o educador en diabetes podrn ayudarlo a crear un plan de actividades que lo beneficie. Las recomendaciones generales incluyen lo siguiente:  Psychologist, clinical a los nios para que realicen al menos 60 minutos de actividad fsica Armed forces operational officer.  Estirarse y Optometrist ejercicios de entrenamiento de la fuerza, como yoga o levantamiento de pesas, por lo menos 2 veces por semana.  Realizar en total por lo menos 150 minutos de ejercicios de intensidad moderada cada semana, como caminar a paso ligero o hacer gimnasia acutica.  Hacer ejercicio fsico por lo menos 3 das por semana y no dejar pasar ms de 2 das seguidos sin ejercitarse.  Evitar los perodos largos de inactividad (90 minutos o ms tiempo). Cuando deba pasar mucho tiempo sentado, haga pausas frecuentes para caminar o estirarse. RECOMENDACIONES PARA REALIZAR EJERCICIOS CUANDO SE TIENE DIABETES TIPO 1 O TIPO 2   Controle la glucosa en la sangre antes de comenzar.  Si el nivel de glucosa en la sangre es de ms de 240 mg/dl, controle las cetonas en la [place]. No haga actividad fsica si hay cetonas.  Evite inyectarse insulina en las zonas del cuerpo que ejercitar. Por ejemplo, evite inyectarse insulina en:  Los brazos, si juega al tenis.  Las piernas, si corre.  Lleve un registro de:  Los alimentos que consume antes y despus de TEFL teacher.  Los momentos esperables de picos de accin de la insulina.  Los niveles de glucosa en la sangre antes y despus de hacer ejercicios.  El tipo y cantidad de [place] fsica que  Musician.  Revise los registros con su mdico. El mdico lo ayudar a Actor pautas para ajustar la cantidad de alimento y las cantidades de insulina antes y despus de Field seismologist ejercicios.  Si toma insulina o agentes hipoglucemiantes por va oral, observe si hay signos y sntomas de hipoglucemia. Entre los que se incluyen:  Mareos.  Temblores.  Sudoracin.  Escalofros.  Confusin.  Beba gran cantidad de agua mientras hace ejercicios para evitar la deshidratacin o los golpes de Freight forwarder. Durante la actividad fsica se pierde agua corporal que se debe reponer.  Comente con su mdico antes de comenzar un programa de actividad fsica para verificar que sea seguro para usted. Recuerde, cualquier actividad es mejor que ninguna.   Esta informacin no tiene Marine scientist el consejo del mdico. Asegrese de hacerle al mdico cualquier pregunta que tenga.   Document Released: 07/26/2007 Document Revised: 11/20/2014 Elsevier Interactive Patient Education 2016 [employer].   -  Hipertensin (Hypertension) El trmino hipertensin es otra forma de denominar a la presin arterial elevada. La presin arterial elevada fuerza al corazn a trabajar ms para bombear la sangre. Una lectura de la presin arterial consta de dos nmeros: uno ms alto sobre uno ms bajo (por ejemplo, 110/72). CUIDADOS EN EL HOGAR   Haga que el mdico le tome nuevamente la presin arterial.  Tome los medicamentos solamente como se lo haya indicado el mdico. Siga cuidadosamente las indicaciones. Los medicamentos pierden eficacia si omite dosis. El hecho de omitir las dosis tambin [place] el riesgo de otros problemas.  No fume.  Contrlese la presin arterial en su casa como se lo haya indicado el mdico. SOLICITE AYUDA SI:  Piensa que tiene una reaccin a los medicamentos que est tomando.  Tiene mareos o dolores de cabeza reiterados.  Se le inflaman (hinchan) los tobillos.  Tiene problemas de  visin. SOLICITE AYUDA DE INMEDIATO SI:   Tiene un dolor de cabeza muy intenso y est confundido.  Se siente dbil, aturdido o se desmaya.  Tiene dolor en el pecho o el estmago (abdominal).  Tiene vmitos.  No puede respirar [employer]. ASEGRESE DE QUE:   Comprende estas instrucciones.  Controlar su afeccin.  Recibir ayuda de inmediato si no mejora o si empeora.   Esta informacin no tiene Marine scientist el consejo del mdico. Asegrese de hacerle al mdico cualquier pregunta que tenga.   Document Released: 12/24/2009 Document Revised: 07/11/2013 Elsevier Interactive Patient Education 2016 [place] muscular. (Muscle Strain) Una distensin muscular (estiramiento muscular) ocurre cuando un msculo se estira ms all de la longitud normal. Mahlon Gammon cuando una fuerza violenta bruscamente estira demasiado el msculo. Generalmente se desgarran algunas de las fibras del msculo. La distensin muscular es comn en los atletas. La recuperacin normalmente tarda de 1 a 2semanas. La curacin completa tarda de 5 a 6semanas.  CUIDADOS EN  EL HOGAR   Siga el mtodo PRICE (por sus siglas en ingls) de tratamiento para que la lesin mejore. Hgalo durante los 2 a 3 primeros das despus de la lesin:  Engineer, maintenance. Proteja el msculo para evitar que se vuelva a lesionar.  Reposo. Limite la actividad y descanse la parte del cuerpo lesionada.  Hielo. Ponga el hielo en una bolsa plstica. Coloque una toalla entre la piel y la bolsa de hielo. Luego aplique el hielo y djelo actuar de 64 a 65minutos por hora. Despus del Building control surveyor, cambie a compresas de calor hmedo.  Compresin. Use una frula o venda elstica en la zona lesionada para brindar alivio. No la ajuste demasiado.  Elevacin. Eleve la zona lesionada por encima del nivel del corazn.  Solo tome los medicamentos que le haya indicado su mdico.  Realice un calentamiento antes de hacer ejercicio para  prevenir distensiones musculares futuras. SOLICITE AYUDA SI:   Siente ms dolor o inflamacin (hinchazn) en la zona lesionada.  Siente adormecimiento, hormigueo o nota una prdida de fuerza en la zona lesionada. ASEGRESE DE QUE:   Comprende estas instrucciones.  Controlar su afeccin.  Recibir ayuda de inmediato si no mejora o si empeora.   Esta informacin no tiene Marine scientist el consejo del mdico. Asegrese de hacerle al mdico cualquier pregunta que tenga.   Document Released: 10/02/2008 Document Revised: 04/26/2013 Elsevier Interactive Patient Education 2016 [place] Tdap (contra la difteria, el ttanos y [place]): Lo que debe saber (Tdap Vaccine [Tetanus, Diphtheria, and Pertussis]: What You Need to Know) 1. Por qu vacunarse? El ttanos, la difteria y la tosferina son enfermedades muy graves. La vacuna Tdap nos puede proteger de estas enfermedades. Adems, la vacuna Tdap que se aplica a las Chemical engineer a los bebs recin nacidos contra la tosferina. En la actualidad, el [place] (trismo) es una enfermedad poco frecuente en los [place]. Provoca la contraccin y el endurecimiento dolorosos de los msculos, por lo general, de todo el cuerpo.  Puede causar el endurecimiento de los msculos de la cabeza y el cuello, de modo que impide abrir la boca, tragar y en algunos casos, Ambulance person. El ttanos causa la muerte de aproximadamente 1de cada 10personas que contraen la infeccin, incluso despus de que reciben la mejor atencin mdica. La DIFTERIA tambin es poco frecuente en los [place] [employer]. Puede causar la formacin de una membrana gruesa en la parte posterior de la garganta.  Esto tiene como consecuencia problemas respiratorios, insuficiencia cardaca, parlisis y [place]. La TOSFERINA (tos convulsa) provoca episodios de tos intensa que pueden dificultar la respiracin y provocar vmitos y trastornos del  sueo.  Tambin puede causar prdida de peso, incontinencia y fractura de [place]. Dos de cada 100 adolescentes y 5 de cada 100 adultos con tosferina deben ser hospitalizados o tienen complicaciones, que podran incluir neumona y [place]. Estas enfermedades son provocadas por bacterias. La difteria y la tosferina se contagian de Ardelia Mems persona a otra a travs de las secreciones de la tos o el estornudo. El ttanos ingresa al organismo a travs de cortes, rasguos o heridas. Antes de las vacunas, en los [place] se informaban 200000 casos de difteria, 200000 casos de tosferina y cientos de casos de ttanos cada ao. Desde el inicio de la vacunacin, los informes de casos de ttanos y difteria han disminuido alrededor del 99%, y de tosferina, alrededor del 80%. 2. Edward Jolly Tdap La vacuna Tdap protege a adolescentes y adultos contra  el ttanos, la difteria y la tosferina. Una dosis de Tdap se administra a los 29 o 12 aos. Las [place] [employer] no recibieron la vacuna Tdap a esa edad deben recibirla tan pronto como sea posible. Es muy importante que los mdicos y todos aquellos que tengan contacto cercano con bebs menores de 43meses reciban la vacuna Tdap. Las mujeres deben recibir una dosis de Tdap en cada Media planner, para proteger al recin nacido de la tosferina. Los nios tienen mayor riesgo de complicaciones graves y potencialmente mortales debido a la tosferina. Otra vacuna llamada Td protege contra el ttanos y la difteria, pero no contra la tosferina. Todos deben recibir una dosis de refuerzo de Td cada 10 aos. La Tdap puede aplicarse como uno de estos refuerzos si nunca antes recibi esta vacuna. Tambin se puede aplicar despus de un corte o quemadura grave para prevenir la infeccin por ttanos. El mdico o la persona que le aplique la vacuna puede darle ms informacin al [employer]. La Tdap puede administrarse de manera segura simultneamente con otras vacunas. 3. Algunas personas no deben  recibir la [employer] persona que alguna vez tuvo una reaccin alrgica potencialmente mortal a Ardelia Mems dosis previa de cualquier vacuna contra el ttanos, la difteria o la tosferina, O que tenga una alergia grave a cualquiera de los componentes de esta vacuna, no debe recibir la vacuna Tdap. Informe a la persona que le aplica la vacuna si tiene cualquier alergia grave.  Una persona que estuvo en estado de coma o sufri mltiples convulsiones en el trmino de los 7das despus de recibir una dosis de DTP o DTaP, o una dosis previa de Tdap, no debe recibir la vacuna Tdap, salvo que se haya encontrado otra causa que no fuera la vacuna. An puede recibir la Td.  Consulte con su mdico si:  tiene convulsiones u otro problema del sistema nervioso,  tuvo hinchazn o dolor intenso despus de cualquier vacuna contra la difteria o el ttanos,  alguna vez ha sufrido el sndrome de [place],  no se siente Pharmacologist en que se ha programado la vacuna. 4. Riesgos Con cualquier medicamento, incluyendo las vacunas, existe la posibilidad de que aparezcan efectos secundarios. Suelen ser leves y desaparecen por s solos. Tambin son posibles las reacciones graves, pero en raras ocasiones. La [employer] de las personas a las que se les aplica la vacuna Tdap no tienen ningn problema. Problemas leves despus de la vacuna Tdap (No interfirieron en otras actividades)  Dolor en el lugar donde se aplic la vacuna (alrededor de 3 de cada 4 adolescentes o 2 de cada 3 adultos).  Enrojecimiento o hinchazn en el lugar donde se aplic la vacuna (1 de cada 5 personas).  Fiebre leve de al menos 100,62F (38C) (hasta alrededor de 1 cada 25 adolescentes o 1 de cada 100 adultos).  Dolor de cabeza (alrededor de 3 o 4 de cada 10 personas).  Cansancio (alrededor de 1 de cada 3 o 4 personas).  Nuseas, vmitos, diarrea, dolor de estmago (hasta 1 de cada 4 adolescentes o 1 de cada 10 adultos).  Escalofros, dolores  articulares (alrededor de 1de cada 10personas).  Dolores corporales (alrededor de 1de cada 3 o 4personas).  Erupcin cutnea, inflamacin de los ganglios (poco frecuente). Problemas moderados despus de recibir la vacuna Tdap (Interfirieron en otras actividades, pero no requirieron atencin mdica)  Management consultant donde se aplic la vacuna (hasta 1de cada 5 o 6).  Enrojecimiento o inflamacin en TEFL teacher donde se  aplic la vacuna (hasta alrededor de 1 de cada 16adolescentes o 1 de cada 12adultos).  Fiebre de ms de 102F (38,8C) (alrededor de 1 de cada 100 adolescentes o 1 de cada 250 adultos).  Dolor de cabeza (alrededor de 1de cada 7adolescentes o 1de cada 10adultos).  Nuseas, vmitos, diarrea, dolor de estmago (hasta 1 o 3 de cada 100 personas).  Hinchazn de todo el brazo en el que se aplic la vacuna (hasta alrededor de 1de cada 500personas). Problemas graves despus de la vacuna Tdap (Impidieron Optometrist las actividades habituales; requirieron atencin mdica)  Inflamacin, dolor intenso, sangrado y enrojecimiento en el brazo en que se aplic la vacuna (poco frecuente). Problemas que podran ocurrir despus de cualquier vacuna:  Las personas a veces se desmayan despus de un procedimiento mdico, incluida la vacunacin. Si permanece sentado o recostado durante 15 minutos puede ayudar a [employer] y las lesiones causadas por las cadas. Informe al mdico si se siente mareado, tiene cambios en la visin o zumbidos en los odos.  Algunas personas sienten un dolor intenso en el hombro y tienen dificultad para mover el brazo donde se coloc la vacuna. Esto sucede con muy poca frecuencia.  Cualquier medicamento puede causar una reaccin alrgica grave. Dichas reacciones son Orlene Erm poco frecuentes con una vacuna (se calcula que menos de 1en un milln de dosis) y se producen de unos minutos a unas horas despus de Writer. Al igual que con cualquier  [employer], existe una probabilidad muy remota de que una vacuna cause una lesin grave o la [place]. Se controla permanentemente la seguridad de las vacunas. Para obtener ms informacin, visite: http://www.aguilar.org/. 5. Qu pasa si hay un problema grave? A qu signos debo estar atento?  Observe todo lo que le preocupe, como signos de una reaccin alrgica grave, fiebre muy alta o comportamiento fuera de lo normal.  Los signos de una reaccin alrgica grave pueden incluir ronchas, hinchazn de la cara y la garganta, dificultad para respirar, latidos cardacos acelerados, mareos y debilidad. Generalmente, estos comenzaran entre unos pocos minutos y algunas horas despus de la vacunacin. Qu debo hacer?  Si usted piensa que se trata de una reaccin alrgica grave o de otra emergencia que no puede esperar, llame al 911 o lleve a la persona al hospital ms cercano. Sino, llame a su mdico.  Despus, la reaccin debe informarse al Sistema de Informacin sobre Efectos Adversos de las [place] (Vaccine Adverse Event Reporting System, VAERS). Su mdico puede presentar este informe, o puede hacerlo usted mismo a travs del sitio web de VAERS, en [email or web address], o llamando al [phone]. VAERS no brinda recomendaciones mdicas. 6. [place] Compensacin de Daos por [place] de Compensacin de Daos por Clinical biochemist (National Vaccine Injury Compensation Program, VICP) es un programa federal que fue creado para Patent examiner a las personas que puedan haber sufrido daos al recibir ciertas vacunas. Aquellas personas que consideren que han sufrido un dao como consecuencia de una vacuna y [place] saber ms acerca del programa y de cmo presentar Raechel Chute, pueden llamar al [phone] o visitar su sitio web en [email or web address]. Hay un lmite de tiempo para presentar un reclamo de compensacin. 7. Cmo puedo obtener ms informacin?  Consulte a su  mdico. Este puede darle el prospecto de la vacuna o recomendarle otras fuentes de informacin.  Comunquese con el servicio de salud de su localidad o su estado.  Comunquese con los Centros para Building surveyor y la Prevencin de Probation officer for Disease  Control and Prevention , CDC).  Llame al 8203447644 (1-800-CDC-INFO), o  visite el sitio web [employer] en [email or web address]/. Declaracin de informacin sobre la vacuna contra la difteria, el ttanos y la tosferina (Tdap) de los CDC (24/02/15)   Esta informacin no tiene Marine scientist el consejo del mdico. Asegrese de hacerle al mdico cualquier pregunta que tenga.   Document Released: 06/22/2012 Document Revised: 07/27/2014 Elsevier Interactive Patient Education 2016 [place] antineumoccica de polisacridos: Lo que debe saber (Pneumococcal Polysaccharide Vaccine: What You Need to Know) 1. Por qu vacunarse? La vacunacin puede proteger a los [employer] (y a algunos nios y adultos ms jvenes) de la enfermedad neumoccica. La enfermedad neumoccica es causada por una bacteria que puede contagiarse de [place] persona a otra por contacto directo. Puede provocar infecciones en los odos y tambin infecciones ms graves en:   los pulmones (neumona),  la sangre (bacteriemia), y  las membranas que cubren el cerebro y la mdula espinal (meningitis). La meningitis puede causar sordera y dao cerebral, y puede ser mortal. Cualquier persona puede contraer la enfermedad neumoccica, pero los nios menores de 2 aos, las personas con [employer], los adultos mayores de 66 aos y los fumadores son los que corren un mayor riesgo. Cada ao, mueren alrededor de [employer] a causa de la enfermedad [employer] [place]. El tratamiento de las infecciones neumoccicas con penicilina y otros medicamentos era ms efectivo en el pasado. Sin embargo, algunas cepas de la bacteria que  causa la enfermedad se han vuelto resistentes a estos medicamentos. Esto hace que la prevencin de la enfermedad mediante la vacunacin sea an ms importante. 2. Edward Jolly antineumoccica de polisacridos (PPSV23) La vacuna antineumoccica de polisacridos (PPSV23) protege contra 23tipos de bacterias neumoccicas. No previene todas las enfermedades neumoccicas. La vacuna PPSV23 se recomienda para las siguientes personas:  Todos los adultos de 65aos en adelante.  Cleora Fleet persona de 2a 64aos que tenga un problema de salud a Barrister's clerk.  [employer] de 2 a 19 aos que tengan el sistema inmunitario dbil.  Los adultos de 19 a 64 aos que fumen o tengan asma. [place] solo necesitan una dosis de la vacuna PPSV. Para ciertos grupos de alto riesgo se recomienda una segunda dosis. Las The First American de 65 aos deben recibir una dosis de la vacuna, aun si recibieron una o ms dosis antes de [employer]. El mdico puede brindarle ms informacin sobre estas recomendaciones. La mayora de los adultos sanos adquiere proteccin 2 a 3semanas despus de haber recibido la vacuna. 3. Algunas personas no deben recibir la vacuna  Cleora Fleet persona que haya sufrido una reaccin alrgica potencialmente mortal a la PPSV no debe recibir otra dosis.  Las personas que tengan Buyer, retail grave a los componentes de la vacuna PPSV no deben recibirla. Informe a su mdico si sufre de algn tipo de alergia grave.  Cleora Fleet persona que est enferma en un grado moderado o grave el da en que est programada la vacunacin, probablemente deba esperar a recuperarse para recibirla. Por lo general, una persona con una enfermedad leve puede vacunarse.  Los nios menores de 2 aos no deben recibir [employer].  No hay pruebas de que la vacuna PPSV sea perjudicial para las mujeres embarazadas o el feto. No obstante, como precaucin, las mujeres que necesiten aplicarse la vacuna deben hacerlo antes de quedar  embarazadas, si es posible. 4. Riesgos de [place] reaccin a la vacuna Con cualquier [employer] Company,  incluyendo las vacunas, existe la posibilidad de que aparezcan efectos secundarios. Estos son leves y desaparecen por s solos, pero tambin son posibles las reacciones graves. Aproximadamente la mitad de las personas que reciben la PPSV presentan efectos secundarios leves, como enrojecimiento o Management consultant donde se aplic la vacuna, que desaparecen a [place]. Menos de 1 de cada 100personas presenta fiebre, dolores musculares o reacciones locales ms graves. Problemas que podran ocurrir despus de cualquier vacuna:  Las personas a veces se desmayan despus de un procedimiento mdico, incluso despus de recibir Engineer, drilling. Si permanece sentado o recostado durante 15 minutos puede ayudar a Merrill Lynch y las lesiones causadas por las cadas. Informe al mdico si se siente mareado, tiene cambios en la visin o zumbidos en los odos.  Algunas personas sienten un dolor intenso en el hombro y tienen dificultad para mover el brazo donde se coloc la vacuna. Esto sucede con muy poca frecuencia.  Cualquier medicamento puede causar una reaccin alrgica grave. Dichas reacciones son Orlene Erm poco frecuentes con una vacuna (se calcula que menos de 1en un milln de dosis) y se producen unos minutos a unas horas despus de la vacunacin. Al igual que con cualquier [employer] Company, existe una probabilidad remota de que una vacuna cause una lesin grave o la [place]. Se controla permanentemente la seguridad de las vacunas. Para obtener ms informacin, visite: http://www.aguilar.org/. 5. Qu pasa si hay una reaccin grave? A qu signos debo estar atento? Observe todo lo que le preocupe, como signos de una reaccin alrgica grave, fiebre muy alta o comportamiento fuera de lo normal.  Los signos de una reaccin alrgica grave pueden incluir ronchas, hinchazn de la cara y la garganta, dificultad para  respirar, latidos cardacos acelerados, mareos y debilidad. Generalmente, estos comenzaran entre unos pocos minutos y algunas horas despus de la vacunacin. Qu debo hacer? Si usted piensa que se trata de una reaccin alrgica grave o de otra emergencia que no puede esperar, llame al 911 o dirjase al hospital ms cercano. Sino, llame a su mdico. Despus, la reaccin debe informarse al Sistema de Informacin sobre Efectos Adversos de las [place] (Vaccine Adverse Event Reporting System, VAERS). Su mdico puede presentar este informe, o puede hacerlo usted mismo a travs del sitio web de VAERS, en [email or web address], o llamando al [phone].  VAERS no brinda recomendaciones mdicas. 6. Cmo puedo obtener ms informacin?  Consulte a su mdico. Este puede darle el prospecto de la vacuna o recomendarle otras fuentes de informacin.  Comunquese con el servicio de salud de su localidad o su estado.  Comunquese con los Centros para Building surveyor y la Prevencin de Probation officer for Disease Control and Prevention , CDC).  Llame al [phone] (1-800-CDC-INFO), o  Visite el sitio web de [employer] en [email or web address]/. Declaracin de informacin sobre la vacuna antineumoccica de polisacridos de los CDC (11/10/13)   Esta informacin no tiene Marine scientist el consejo del mdico. Asegrese de hacerle al mdico cualquier pregunta que tenga.   Document Released: 10/02/2008 Document Revised: 07/27/2014 Elsevier Interactive Patient Education 2016 [employer]. Influenza Virus Vaccine injection (Fluarix) Qu es este medicamento? La VACUNA ANTIGRIPAL ayuda a disminuir el riesgo de contraer la influenza, tambin conocida como la gripe. La vacuna solo ayuda a protegerle contra algunas cepas de influenza. Esta vacuna no ayuda a reducir Catering manager de contraer influenza pandmica H1N1. Este medicamento puede ser utilizado para otros usos; si tiene alguna pregunta consulte con su  proveedor de atencin mdica  o con su farmacutico. Qu le debo informar a mi profesional de la salud antes de tomar este medicamento? Necesita saber si usted presenta alguno de los siguientes problemas o situaciones: -trastorno de sangrado como hemofilia -fiebre o infeccin -sndrome de Guillain-Barre u otros problemas neurolgicos -problemas del sistema inmunolgico -infeccin por el virus de la inmunodeficiencia humana (VIH) o SIDA -niveles bajos de plaquetas en la sangre -esclerosis mltiple -una Risk analyst o inusual a las vacunas antigripales, a los huevos, protenas de pollo, al ltex, a la gentamicina, a otros medicamentos, alimentos, colorantes o conservantes -si est embarazada o buscando quedar embarazada -si est amamantando a un beb Cmo debo utilizar este medicamento? Esta vacuna se administra mediante inyeccin por va intramuscular. Lo administra un profesional de [employer]	[place]. Recibir una copia de informacin escrita sobre la vacuna antes de cada vacuna. Asegrese de leer este folleto cada vez cuidadosamente. Este folleto puede cambiar con frecuencia. Hable con su pediatra para informarse acerca del uso de este medicamento en nios. Puede requerir atencin especial. Sobredosis: Pngase en contacto inmediatamente con un centro toxicolgico o una sala de urgencia si usted cree que haya tomado demasiado medicamento. ATENCIN: [employer] es solo para usted. No comparta este medicamento con nadie. Qu sucede si me olvido de una dosis? No se aplica en este caso. Qu puede interactuar con este medicamento? -quimioterapia o radioterapia -medicamentos que suprimen el sistema inmunolgico, tales como etanercept, anakinra, infliximab y adalimumab -medicamentos que tratan o previenen cogulos sanguneos, como warfarina -fenitona -medicamentos esteroideos, como la prednisona o la cortisona -teofilina -vacunas Puede ser que esta lista no menciona todas las posibles  interacciones. Informe a su profesional de [employer]	[place] de [employer] Corporation productos a base de hierbas, medicamentos de [place] o suplementos nutritivos que est tomando. Si usted fuma, consume bebidas alcohlicas o si utiliza drogas ilegales, indqueselo tambin a su profesional de [employer]	[place]. Algunas sustancias pueden interactuar con su medicamento. A qu debo estar atento al usar Coca[employer]a? Informe a su mdico o a Barrister's clerk de la [employer] todos los efectos secundarios que persistan despus de 3 das. Llame a su proveedor de atencin mdica si se presentan sntomas inusuales dentro de las 6 semanas posteriores a la vacunacin. Es posible que todava pueda contraer la gripe, pero la enfermedad no ser tan fuerte como normalmente. No puede contraer la gripe de esta vacuna. La vacuna antigripal no le protege contra resfros u otras enfermedades que pueden causar [place]. Debe vacunarse cada ao. Qu efectos secundarios puedo tener al [employer] este medicamento? Efectos secundarios que debe informar a su mdico o a Barrister's clerk de la salud tan pronto como sea posible: -reacciones alrgicas como erupcin cutnea, picazn o urticarias, hinchazn de la cara, labios o lengua Efectos secundarios que, por lo general, no requieren atencin mdica (debe informarlos a su mdico o a su profesional de la salud si persisten o si son molestos): -fiebre -dolor de cabeza -molestias y dolores musculares -dolor, sensibilidad, enrojecimiento o Estate agent de la inyeccin -cansancio o debilidad Puede ser que esta lista no menciona todos los posibles efectos secundarios. Comunquese a su mdico por asesoramiento mdico Humana Inc. Usted puede informar los efectos secundarios a la FDA por telfono al 1-800-FDA-1088. Dnde debo guardar mi medicina? Esta vacuna se administra solamente en clnicas, farmacias, consultorio mdico u otro consultorio de un profesional de la salud y no  Sports coach en su domicilio. ATENCIN: Este folleto es un resumen. Puede ser que no [place] toda  la posible informacin. Si usted tiene preguntas acerca de esta medicina, consulte con su mdico, su farmacutico o su profesional de Technical sales engineer.    2016, Elsevier/Gold Standard. (2010-01-07 15:31:40)

## 2016-04-03 LAB — HIV ANTIBODY (ROUTINE TESTING W REFLEX): HIV 1&2 Ab, 4th Generation: NONREACTIVE

## 2016-04-03 LAB — HEPATITIS C ANTIBODY: HCV AB: NEGATIVE

## 2016-04-06 ENCOUNTER — Other Ambulatory Visit: Payer: Self-pay | Admitting: Internal Medicine

## 2016-04-06 DIAGNOSIS — D649 Anemia, unspecified: Secondary | ICD-10-CM

## 2016-04-06 DIAGNOSIS — D509 Iron deficiency anemia, unspecified: Secondary | ICD-10-CM

## 2016-04-06 MED ORDER — SENNA 8.6 MG PO TABS: 1.0000 | ORAL_TABLET | Freq: Every day | ORAL | 0 refills | Status: DC | PRN

## 2016-04-06 MED ORDER — FERROUS GLUCONATE 324 (38 FE) MG PO TABS: 324.0000 mg | ORAL_TABLET | Freq: Three times a day (TID) | ORAL | 3 refills | Status: DC

## 2016-04-08 ENCOUNTER — Telehealth: Payer: Self-pay | Admitting: Internal Medicine

## 2016-04-08 MED ORDER — TRAMADOL HCL 50 MG PO TABS: 50.0000 mg | ORAL_TABLET | Freq: Three times a day (TID) | ORAL | 0 refills | Status: DC | PRN

## 2016-04-08 NOTE — Telephone Encounter (Signed)
Please call. Tell her to stop the neurontin if it is making her dizzy. Also she needs to stop the motrin 800 as well b/c of her severe anemia.  Needs to make fu w/ me in 1-2 wks.   This is my letter on her labs:  Please call. Hep c screening negative, hiv neg, thyroid nml, cholesterol slightly elevated. To address this please limit saturated fat to no more than 7% of your calories, limit cholesterol to 200 mg/day, increase fiber and exercise as tolerated. If needed we may add cholesterol lowering medication to your regimen. Kidney fn nml, Blood count very low. Please start taking iron pills 3x day for now, high fiber diet to prevent constipation, stool softerners as needed for constipation, come back for repeat least asap (future order). Please STOP taking all NSAIDS, including ibuprofen because may be causing GI bleeding and blood loss. Does she have cone discount/orange card? If so, I need to send her to GI for colonoscopy to r/o other causes of bleeding. Ask her to make f/u appt w/ me in 1-2 wks, to rechk as well.   Ref Range & Units 6d ago

## 2016-04-08 NOTE — Telephone Encounter (Signed)
Pt came into office, states she has a medication reaction to gabapentin (NEURONTIN) 100 MG capsule, pt states she had taken medication before and it causes her to have dizziness, dry mouth, and headaches. Pt would like different medication sent to [facility], pt is also requesting lab results. Please f/up

## 2016-04-08 NOTE — Telephone Encounter (Signed)
Will forward to pcp

## 2016-04-09 NOTE — Telephone Encounter (Signed)
Would please call patient and schedule appointment thanks

## 2016-04-09 NOTE — Telephone Encounter (Signed)
Left patient a message regarding her appt and informed her that she will receive a call regarding her lab results.

## 2016-04-16 ENCOUNTER — Ambulatory Visit: Payer: Medicare Other | Admitting: Internal Medicine

## 2016-04-17 DIAGNOSIS — R12 Heartburn: Secondary | ICD-10-CM | POA: Diagnosis not present

## 2016-04-22 ENCOUNTER — Telehealth: Payer: Self-pay

## 2016-04-22 NOTE — Telephone Encounter (Signed)
[employer] Leanna Sato [id number] contacted pt to go over lab results pt didn't answer asked pt to give me a call back at her earliest Convenience

## 2016-04-23 ENCOUNTER — Telehealth: Payer: Self-pay

## 2016-04-23 NOTE — Telephone Encounter (Signed)
[place] [id number] Contacted pt to go over lab results pt did not answer lvm making her aware I will be sending a letter out today and if she has any questions or concerns to give me a call

## 2016-04-28 ENCOUNTER — Ambulatory Visit: Payer: Medicare Other | Attending: Internal Medicine | Admitting: Internal Medicine

## 2016-04-28 VITALS — BP 154/84 | HR 84 | Temp 98.7°F | Resp 16 | Wt 142.2 lb

## 2016-04-28 DIAGNOSIS — Z1231 Encounter for screening mammogram for malignant neoplasm of breast: Secondary | ICD-10-CM | POA: Diagnosis not present

## 2016-04-28 DIAGNOSIS — Z888 Allergy status to other drugs, medicaments and biological substances status: Secondary | ICD-10-CM | POA: Diagnosis not present

## 2016-04-28 DIAGNOSIS — M79604 Pain in right leg: Secondary | ICD-10-CM | POA: Diagnosis not present

## 2016-04-28 DIAGNOSIS — R7303 Prediabetes: Secondary | ICD-10-CM | POA: Diagnosis not present

## 2016-04-28 DIAGNOSIS — M79605 Pain in left leg: Secondary | ICD-10-CM | POA: Diagnosis not present

## 2016-04-28 DIAGNOSIS — D509 Iron deficiency anemia, unspecified: Secondary | ICD-10-CM | POA: Diagnosis not present

## 2016-04-28 DIAGNOSIS — Z86010 Personal history of colonic polyps: Secondary | ICD-10-CM | POA: Insufficient documentation

## 2016-04-28 DIAGNOSIS — I10 Essential (primary) hypertension: Secondary | ICD-10-CM | POA: Insufficient documentation

## 2016-04-28 DIAGNOSIS — Z79899 Other long term (current) drug therapy: Secondary | ICD-10-CM | POA: Diagnosis not present

## 2016-04-28 DIAGNOSIS — Z7984 Long term (current) use of oral hypoglycemic drugs: Secondary | ICD-10-CM | POA: Insufficient documentation

## 2016-04-28 DIAGNOSIS — Z1239 Encounter for other screening for malignant neoplasm of breast: Secondary | ICD-10-CM

## 2016-04-28 DIAGNOSIS — E119 Type 2 diabetes mellitus without complications: Secondary | ICD-10-CM | POA: Diagnosis not present

## 2016-04-28 LAB — CBC WITH DIFFERENTIAL/PLATELET
Basophils Absolute: 64 cells/uL (ref 0–200)
Basophils Relative: 1 %
EOS ABS: 128 {cells}/uL (ref 15–500)
EOS PCT: 2 %
HCT: 29 % — ABNORMAL LOW (ref 35.0–45.0)
Hemoglobin: 8.5 g/dL — ABNORMAL LOW (ref 11.7–15.5)
LYMPHS PCT: 30 %
Lymphs Abs: 1920 cells/uL (ref 850–3900)
MCH: 23.7 pg — AB (ref 27.0–33.0)
MCHC: 29.3 g/dL — ABNORMAL LOW (ref 32.0–36.0)
MCV: 81 fL (ref 80.0–100.0)
MONOS PCT: 8 %
MPV: 9.2 fL (ref 7.5–12.5)
Monocytes Absolute: 512 cells/uL (ref 200–950)
Neutro Abs: 3776 cells/uL (ref 1500–7800)
Neutrophils Relative %: 59 %
PLATELETS: 495 10*3/uL — AB (ref 140–400)
RBC: 3.58 MIL/uL — AB (ref 3.80–5.10)
WBC: 6.4 10*3/uL (ref 3.8–10.8)

## 2016-04-28 LAB — IRON,TIBC AND FERRITIN PANEL
%SAT: 79 % — ABNORMAL HIGH (ref 11–50)
FERRITIN: 12 ng/mL — AB (ref 20–288)
IRON: 309 ug/dL — AB (ref 45–160)
TIBC: 393 ug/dL (ref 250–450)

## 2016-04-28 LAB — GLUCOSE, POCT (MANUAL RESULT ENTRY): POC GLUCOSE: 96 mg/dL (ref 70–99)

## 2016-04-28 MED ORDER — VALSARTAN 40 MG PO TABS: 40.0000 mg | ORAL_TABLET | Freq: Every day | ORAL | 2 refills | Status: DC

## 2016-04-28 MED ORDER — DICLOFENAC SODIUM 1 % TD GEL: 2.0000 g | Freq: Four times a day (QID) | TRANSDERMAL | 2 refills | Status: DC

## 2016-04-28 NOTE — Progress Notes (Signed)
Pt is in the office today for medication side effect Pt states she is having side effects from the gabapentin Pt states she is not in any pain

## 2016-04-28 NOTE — Progress Notes (Signed)
Jodi [name], is a 70 y.o. female  [id number]  [id number]  DOB - 03-Aug-1945  Chief Complaint  Patient presents with  . Medication Reaction        Subjective:   Jodi [name] is a 70 y.o. female here today for a follow up visit.  Started taking iron pills as directed, and stopped Nsaid use.  Of note, she had colonoscopy 6 years ago w/ several polyps detected per pt.  She states she was unable to tol the gabapentin, got severely nauseous and dizzy, tried taking it for 3 days and stopped.  Denies hematochezia/hematemasis/brbpr/hemoptysis/weight gain/weight loss.   Does get leg pains that start from the soles of her legs and radiate up, feels "heavy and warm". Denies tingling/numbness.  Ran out of her bp meds about 2 days ago, did not know she had refills.  Patient has No headache, No chest pain, No abdominal pain - No Nausea, No new weakness tingling or numbness, No Cough - SOB.   No problems updated.  ALLERGIES: Allergies  Allergen Reactions  . Cortisone     Rash, swelling  . Pork-Derived Products     Rash, joint pain  . Shrimp [Shellfish Allergy]     Rash, joint pain, GI pain    PAST MEDICAL HISTORY: Past Medical History:  Diagnosis Date  . Anemia   . Diabetes mellitus without complication ([place])   . Hypertension   . Insomnia     MEDICATIONS AT HOME: Prior to Admission medications   Medication Sig Start Date End Date Taking? Authorizing Provider  dexlansoprazole (DEXILANT) 60 MG capsule Take 60 mg by mouth daily.   Yes Historical Provider, MD  Elastic Bandages & Supports (LUMBAR BACK BRACE/SUPPORT PAD) MISC 1 application by Does not apply route daily. 04/02/16  Yes Maren Reamer, MD  ferrous gluconate (FERGON) 324 MG tablet Take 1 tablet (324 mg total) by mouth 3 (three) times daily with meals. 04/06/16  Yes Maren Reamer, MD  Iron-FA-B Cmp-C-Biot-Probiotic (FUSION PLUS) CAPS Take by mouth daily.   Yes Historical Provider, MD  metFORMIN  (GLUCOPHAGE) 500 MG tablet Take 1 tablet (500 mg total) by mouth 2 (two) times daily with a meal. 04/02/16  Yes Maren Reamer, MD  senna (SENOKOT) 8.6 MG TABS tablet Take 1 tablet (8.6 mg total) by mouth daily as needed for mild constipation. 04/06/16  Yes Maren Reamer, MD  solifenacin (VESICARE) 10 MG tablet Take 1 tablet (10 mg total) by mouth daily. 04/02/16  Yes Maren Reamer, MD  valsartan (DIOVAN) 40 MG tablet Take 1 tablet (40 mg total) by mouth daily. 04/28/16  Yes Maren Reamer, MD  VITAMIN B1-B12 IJ Inject as directed once a week.   Yes Historical Provider, MD  diclofenac sodium (VOLTAREN) 1 % GEL Apply 2 g topically 4 (four) times daily. 04/28/16   Maren Reamer, MD  traMADol (ULTRAM) 50 MG tablet Take 1 tablet (50 mg total) by mouth every 8 (eight) hours as needed for severe pain. Patient not taking: Reported on 04/28/2016 04/08/16   Maren Reamer, MD     Objective:   Vitals:   04/28/16 1409  BP: (!) 154/84  Pulse: 84  Resp: 16  Temp: 98.7 F (37.1 C)  TempSrc: Oral  SpO2: 95%  Weight: 142 lb 3.2 oz (64.5 kg)    Exam General appearance : Awake, alert, not in any distress. Speech Clear. Not toxic looking, pleasant HEENT: Atraumatic and Normocephalic, pupils equally reactive to light. Neck:  supple, no JVD.  Chest:Good air entry bilaterally, no added sounds. CVS: S1 S2 regular, no murmurs/gallups or rubs. Abdomen: Bowel sounds active, obese, Non tender and not distended with no gaurding, rigidity or rebound. Extremities: B/L Lower Ext shows no edema, both legs are warm to touch,  Pulses intact bilat le. Sensation intact. Neurology: Awake alert, and oriented X 3, CN II-XII grossly intact, Non focal Skin:No Rash  Data Review Lab Results  Component Value Date   HGBA1C 6.1 04/02/2016    Depression screen PHQ 2/9 04/02/2016  Decreased Interest 3  Down, Depressed, Hopeless 3  PHQ - 2 Score 6  Altered sleeping 3  Tired, decreased energy 3  Change in  appetite 2  Feeling bad or failure about yourself  3  Trouble concentrating 3  Moving slowly or fidgety/restless 2  Suicidal thoughts 0  PHQ-9 Score 22      Assessment & Plan   1. Iron deficiency anemia, unspecified iron deficiency anemia type - suspect cause of her leg /feet pains. - Ambulatory referral to Gastroenterology, hx of prior polyps on colonoscopy about 6 years ago per pt. - Iron, TIBC and Ferritin Panel - CBC with Differential - per pt, hx of requiring iron transfusions years ago as well. - reiterated to continue avoiding NSAIDs.   2. Prediabetes aic 6.1 - Glucose (CBG) - has opth exam scheduled for 05/12/16  3. Breast cancer screening - MM Digital Screening; Future  4. Allergy to neurontin - added to allergy list.  5. Leg pains, suspect due to sign iron def. Trial voltarin gel for now until can get iron stores up, also has ultram prn.    Patient have been counseled extensively about nutrition and exercise  Return in about 4 weeks (around 05/26/2016) for anemia.  The patient was given clear instructions to go to ER or return to medical center if symptoms don't improve, worsen or new problems develop. The patient verbalized understanding. The patient was told to call to get lab results if they haven't heard anything in the next week.   This note has been created with Surveyor, quantity. Any transcriptional errors are unintentional.   Maren Reamer, MD, [place] and [facility], [place]   04/28/2016, 3:14 PM

## 2016-04-28 NOTE — Patient Instructions (Signed)
Anemia inespecfica (Anemia, Nonspecific) La anemia es una enfermedad en la que la concentracin de glbulos rojos o el nivel de hemoglobina en la sangre estn por debajo de lo normal. La hemoglobina es la sustancia de los glbulos rojos que lleva el oxgeno a todo el cuerpo. La anemia da como resultado que los tejidos no reciban la cantidad suficiente de oxgeno.  CAUSAS  Las causas ms frecuentes de anemia son:   Elvina Mattes. El sangrado puede ser interno o externo. Incluye sangrado excesivo debido al perodo (en las mujeres) o por los intestinos.   Dficit nutricional.   Enfermedad renal, tiroidea o heptica crnicas.  Enfermedades de la mdula sea que disminuyen la produccin de glbulos rojos.  Cncer y tratamientos para Science writer.  VIH, sida y sus tratamientos.  Trastornos del bazo que aumentan la destruccin de glbulos rojos.  Enfermedades de [employer].  Destruccin excesiva de glbulos rojos debido a una infeccin, a medicamentos y a Nurse, mental health. SIGNOS Y SNTOMAS   Debilidad leve.   Mareos.   Dolor de [place].  Palpitaciones.   Falta de aire, especialmente con el ejercicio.   Palidez.  Sensibilidad al fro.  Indigestin.  Nuseas.  Dificultad para dormir.  Dificultad para concentrarse. Los sntomas pueden ocurrir repentinamente o pueden Psychologist, forensic.  DIAGNSTICO  Con frecuencia es necesario realizar anlisis de [employer]. Estos ayudan al profesional a Adult nurse. Su mdico controlar la materia fecal para Hydrographic surveyor la presencia de [place] y buscar otras causas de prdida de [place].  TRATAMIENTO  El tratamiento vara segn la causa de la anemia. Las opciones de tratamiento son:   Suplementos de hierro, vitamina [id number], o cido flico.   Medicamentos con hormonas.   Transfusin de [place]. Ser necesaria en los casos de prdida de [place] grave.   Hospitalizacin. Ser necesaria si la  prdida de sangre es continua y significativa.   Cambios en la dieta.  Extirpacin del bazo. INSTRUCCIONES PARA EL CUIDADO EN EL HOGAR Cumpla con todas las visitas de control. Generalmente demora varias semanas corregir la anemia, y es muy importante que el mdico controle su enfermedad y su respuesta al [place]. SOLICITE ATENCIN MDICA DE INMEDIATO SI:   Siente debilidad extrema, falta de aire o dolor en el pecho.   Se siente mareado o tiene dificultad para concentrarse.  Tiene una hemorragia vaginal abundante.   Aparece una erupcin cutnea.   La materia fecal es negra, de aspecto alquitranado.   Se desmaya.   Vomita sangre.   Vomita repetidas veces.   Siente dolor abdominal.  Tiene fiebre o sntomas persistentes durante ms de 2 - 3 das.   Tiene fiebre y los sntomas empeoran repentinamente.   Se deshidrata.  ASEGRESE DE QUE:  Comprende estas instrucciones.  Controlar su afeccin.  Recibir ayuda de inmediato si no mejora o si empeora.   Esta informacin no tiene Marine scientist el consejo del mdico. Asegrese de hacerle al mdico cualquier pregunta que tenga.   Document Released: 07/06/2005 Document Revised: 03/08/2013 Elsevier Interactive Patient Education [employer].

## 2016-04-29 ENCOUNTER — Telehealth: Payer: Self-pay

## 2016-04-29 NOTE — Telephone Encounter (Signed)
[place] [id number] Contacted pt to go over lab results lvm asking pt to give me a call at her earliest convenience to go over lab results   **If pt calls back could you please inform pt of results* Thank you  Pt's blood count slightly better since 3 wks ago, responding well to iron,. Please continue taking tid until sees gi for colonoscopy.

## 2016-05-12 DIAGNOSIS — H25813 Combined forms of age-related cataract, bilateral: Secondary | ICD-10-CM | POA: Diagnosis not present

## 2016-05-12 DIAGNOSIS — H40013 Open angle with borderline findings, low risk, bilateral: Secondary | ICD-10-CM | POA: Diagnosis not present

## 2016-05-12 DIAGNOSIS — E119 Type 2 diabetes mellitus without complications: Secondary | ICD-10-CM | POA: Diagnosis not present

## 2016-05-12 LAB — HM DIABETES EYE EXAM

## 2016-05-21 ENCOUNTER — Other Ambulatory Visit: Payer: Self-pay | Admitting: Internal Medicine

## 2016-06-03 DIAGNOSIS — D509 Iron deficiency anemia, unspecified: Secondary | ICD-10-CM | POA: Diagnosis not present

## 2016-06-09 ENCOUNTER — Encounter: Payer: Self-pay | Admitting: Internal Medicine

## 2016-06-09 NOTE — Progress Notes (Unsigned)
Pt see by Dr Katy Fitch, optho. 05/12/16  no dm retionopathy  Will scan.

## 2016-06-17 ENCOUNTER — Encounter: Payer: Self-pay | Admitting: Internal Medicine

## 2016-07-01 ENCOUNTER — Telehealth: Payer: Self-pay

## 2016-07-01 NOTE — Telephone Encounter (Signed)
Error

## 2016-07-15 ENCOUNTER — Telehealth: Payer: Self-pay | Admitting: Internal Medicine

## 2016-07-15 MED ORDER — ACCU-CHEK SOFTCLIX LANCET DEV MISC: 0 refills | Status: DC

## 2016-07-15 MED ORDER — ACCU-CHEK SOFTCLIX LANCETS MISC: 12 refills | Status: DC

## 2016-07-15 MED ORDER — ACCU-CHEK AVIVA PLUS W/DEVICE KIT
PACK | 0 refills | Status: DC
Start: 2016-07-15 — End: 2021-05-28

## 2016-07-15 MED ORDER — GLUCOSE BLOOD VI STRP: ORAL_STRIP | 12 refills | Status: DC

## 2016-07-15 NOTE — Telephone Encounter (Signed)
New scripts with instructions sent in.

## 2016-07-15 NOTE — Telephone Encounter (Signed)
The script needs to be corrected and say 1 times a day for testing

## 2016-07-21 ENCOUNTER — Telehealth: Payer: Self-pay | Admitting: Internal Medicine

## 2016-07-21 NOTE — Telephone Encounter (Signed)
[facility] called stated they have not received new Rx on glucose testing strips. I did informed it was sent in on 07/15/2016  They will fax a new order to [place], today Please follow up.

## 2016-07-21 NOTE — Telephone Encounter (Signed)
I sent it in electronically but if they cannot receive electronic prescriptions, will await fax and send that in.

## 2016-07-30 DIAGNOSIS — K5730 Diverticulosis of large intestine without perforation or abscess without bleeding: Secondary | ICD-10-CM | POA: Diagnosis not present

## 2016-07-30 DIAGNOSIS — D509 Iron deficiency anemia, unspecified: Secondary | ICD-10-CM | POA: Diagnosis not present

## 2016-09-14 DIAGNOSIS — D509 Iron deficiency anemia, unspecified: Secondary | ICD-10-CM | POA: Diagnosis not present

## 2016-09-14 DIAGNOSIS — K5730 Diverticulosis of large intestine without perforation or abscess without bleeding: Secondary | ICD-10-CM | POA: Diagnosis not present

## 2016-10-05 ENCOUNTER — Other Ambulatory Visit (HOSPITAL_COMMUNITY)
Admission: RE | Admit: 2016-10-05 | Discharge: 2016-10-05 | Disposition: A | Discharge status: Home / Self Care | Payer: Medicare Other | Source: Ambulatory Visit | Attending: Internal Medicine | Admitting: Internal Medicine

## 2016-10-05 ENCOUNTER — Ambulatory Visit: Payer: Medicare Other | Attending: Internal Medicine | Admitting: Internal Medicine

## 2016-10-05 VITALS — BP 159/92 | HR 86 | Temp 99.0°F | Resp 16 | Wt 146.2 lb

## 2016-10-05 DIAGNOSIS — Z1272 Encounter for screening for malignant neoplasm of vagina: Secondary | ICD-10-CM | POA: Insufficient documentation

## 2016-10-05 DIAGNOSIS — Z1151 Encounter for screening for human papillomavirus (HPV): Secondary | ICD-10-CM | POA: Insufficient documentation

## 2016-10-05 DIAGNOSIS — Z7984 Long term (current) use of oral hypoglycemic drugs: Secondary | ICD-10-CM | POA: Insufficient documentation

## 2016-10-05 DIAGNOSIS — N952 Postmenopausal atrophic vaginitis: Secondary | ICD-10-CM | POA: Diagnosis not present

## 2016-10-05 DIAGNOSIS — D509 Iron deficiency anemia, unspecified: Secondary | ICD-10-CM | POA: Insufficient documentation

## 2016-10-05 DIAGNOSIS — K5730 Diverticulosis of large intestine without perforation or abscess without bleeding: Secondary | ICD-10-CM

## 2016-10-05 DIAGNOSIS — R7303 Prediabetes: Secondary | ICD-10-CM | POA: Diagnosis not present

## 2016-10-05 DIAGNOSIS — K5900 Constipation, unspecified: Secondary | ICD-10-CM | POA: Diagnosis not present

## 2016-10-05 DIAGNOSIS — N750 Cyst of Bartholin's gland: Secondary | ICD-10-CM | POA: Insufficient documentation

## 2016-10-05 DIAGNOSIS — R300 Dysuria: Secondary | ICD-10-CM | POA: Insufficient documentation

## 2016-10-05 DIAGNOSIS — Z79899 Other long term (current) drug therapy: Secondary | ICD-10-CM | POA: Diagnosis not present

## 2016-10-05 DIAGNOSIS — Z124 Encounter for screening for malignant neoplasm of cervix: Secondary | ICD-10-CM | POA: Insufficient documentation

## 2016-10-05 LAB — GLUCOSE, POCT (MANUAL RESULT ENTRY): POC GLUCOSE: 106 mg/dL — AB (ref 70–99)

## 2016-10-05 LAB — CBC WITH DIFFERENTIAL/PLATELET
Basophils Absolute: 59 cells/uL (ref 0–200)
Basophils Relative: 1 %
Eosinophils Absolute: 236 cells/uL (ref 15–500)
Eosinophils Relative: 4 %
HEMATOCRIT: 37.6 % (ref 35.0–45.0)
Hemoglobin: 11.9 g/dL (ref 11.7–15.5)
LYMPHS PCT: 38 %
Lymphs Abs: 2242 cells/uL (ref 850–3900)
MCH: 29.4 pg (ref 27.0–33.0)
MCHC: 31.6 g/dL — AB (ref 32.0–36.0)
MCV: 92.8 fL (ref 80.0–100.0)
MONO ABS: 354 {cells}/uL (ref 200–950)
MONOS PCT: 6 %
MPV: 9.2 fL (ref 7.5–12.5)
NEUTROS PCT: 51 %
Neutro Abs: 3009 cells/uL (ref 1500–7800)
Platelets: 382 10*3/uL (ref 140–400)
RBC: 4.05 MIL/uL (ref 3.80–5.10)
RDW: 14.3 % (ref 11.0–15.0)
WBC: 5.9 10*3/uL (ref 3.8–10.8)

## 2016-10-05 LAB — BASIC METABOLIC PANEL WITH GFR
BUN: 17 mg/dL (ref 7–25)
CALCIUM: 9.3 mg/dL (ref 8.6–10.4)
CHLORIDE: 106 mmol/L (ref 98–110)
CO2: 25 mmol/L (ref 20–31)
CREATININE: 0.66 mg/dL (ref 0.60–0.93)
GFR, Est African American: >89 mL/min (ref >=60)
GFR, Est Non African American: 89 mL/min (ref >=60)
Glucose, Bld: 102 mg/dL — ABNORMAL HIGH (ref 65–99)
Potassium: 4.8 mmol/L (ref 3.5–5.3)
Sodium: 139 mmol/L (ref 135–146)

## 2016-10-05 LAB — POCT URINALYSIS DIPSTICK
Bilirubin, UA: NEGATIVE
Glucose, UA: NEGATIVE
KETONES UA: NEGATIVE
Leukocytes, UA: NEGATIVE
Nitrite, UA: NEGATIVE
PH UA: 7 (ref 5.0–8.0)
PROTEIN UA: NEGATIVE
SPEC GRAV UA: 1.015 (ref 1.030–1.035)
UROBILINOGEN UA: 0.2 (ref ?–2.0)

## 2016-10-05 LAB — POCT GLYCOSYLATED HEMOGLOBIN (HGB A1C): HEMOGLOBIN A1C: 5.6

## 2016-10-05 MED ORDER — SENNOSIDES-DOCUSATE SODIUM 8.6-50 MG PO TABS: 1.0000 | ORAL_TABLET | Freq: Every evening | ORAL | 3 refills | Status: DC | PRN

## 2016-10-05 MED ORDER — METFORMIN HCL 500 MG PO TABS: 500.0000 mg | ORAL_TABLET | Freq: Two times a day (BID) | ORAL | 3 refills | Status: DC

## 2016-10-05 MED ORDER — VALSARTAN 40 MG PO TABS: 40.0000 mg | ORAL_TABLET | Freq: Every day | ORAL | 2 refills | Status: DC

## 2016-10-05 MED ORDER — SOLIFENACIN SUCCINATE 10 MG PO TABS: 10.0000 mg | ORAL_TABLET | Freq: Every day | ORAL | 2 refills | Status: DC

## 2016-10-05 MED ORDER — POLYETHYLENE GLYCOL 3350 17 GM/SCOOP PO POWD: 17.0000 g | Freq: Two times a day (BID) | ORAL | 1 refills | Status: DC | PRN

## 2016-10-05 MED ORDER — METFORMIN HCL 500 MG PO TABS: 500.0000 mg | ORAL_TABLET | Freq: Every day | ORAL | 3 refills | Status: DC

## 2016-10-05 NOTE — Patient Instructions (Addendum)
- fu 2 wks w/ Jodi Goodpasture RN bp   -   Cmo tomar un bao de asiento (How to Take a [employer]) Un bao se asiento es un bao de agua tibia que se toma mientras se est sentado. El agua solo debe Systems analyst las caderas y cubrir las nalgas. El mdico puede recomendar un bao de asiento para ayudarlo con lo siguiente:  Higienizar la parte baja del cuerpo, incluida la zona genital.  La picazn.  El dolor.  Los NIKE o los msculos que se contraen o se Proofreader. [place] 3 o 4baos de asiento diarios o como se lo haya indicado el mdico. 1. Llene parte de la baera con agua tibia. Solo necesitar que el agua tenga la profundidad suficiente para cubrirle las caderas y las nalgas cuando est sentado en ella. 2. Si el mdico le indic que ponga medicamentos en el agua, siga las indicaciones al pie de la [place]. 3. Sintese en el agua y abra un poco el drenaje de la baera. 4. Sherlon Handing el agua tibia nuevamente para mantener la baera en Teacher, English as a foreign language. Deje correr el agua constantemente. 5. Sumrjase en el agua durante 15 o 33minutos, o como se lo haya indicado el mdico. 6. Despus de tomar el bao de asiento, seque primero la zona afectada con golpecitos suaves. No la frote. 7. Tenga cuidado al pararse despus de tomar un bao de asiento ya que [place]. SOLICITE ATENCIN MDICA SI:  Los sntomas empeoran. No contine con los baos de asiento si los sntomas empeoran.  Aparecen nuevos sntomas. No contine con los baos de asiento hasta que hable con el mdico. Esta informacin no tiene Marine scientist el consejo del mdico. Asegrese de hacerle al mdico cualquier pregunta que tenga. Document Released: 08/17/2006 Document Revised: 11/20/2014 Document Reviewed: 07/04/2014 Elsevier Interactive Patient Education  2017 [place] o absceso de Bartolino (Bartholin Cyst or Abscess) Un quiste de Bartolino es un saco lleno de  lquido que se forma en una glndula de [place]. Las glndulas de Bartolino son pequeas glndulas que se encuentran entre los pliegues de la piel (labios vaginales) a ambos lados del orificio inferior de la vagina. Este tipo de quiste forma un bulto al costado de la vagina. Un quiste que no es grande ni est infectado puede no causar problemas. Sin embargo, si el lquido del quiste se [place], este puede convertirse en un absceso. Un absceso puede causar [employer]. [place] los medicamentos solamente como se lo haya indicado el mdico.  Si le recetaron antibiticos, asegrese de terminarlos, incluso si comienza a sentirse mejor.  Pngase compresas hmedas tibias en la zona o hgase baos de asiento tibios que le cubran la zona plvica. Haga esto varias veces al da o como se lo haya indicado el mdico.  No apriete el quiste. No lo presione con fuerza.  No tenga relaciones sexuales hasta que el quiste haya desaparecido.  Si le abrieron el quiste o el absceso, tal vez le hayan colocado un pequeo trozo de gasa o un drenaje en la zona para permitir la salida de lquido del [place]. No se saque la gasa ni el drenaje hasta que el mdico se lo permita.  No use tampones. Use protectores femeninos segn sea necesario por la prdida de lquido o sangre.  Concurra a todas las visitas de control como se lo haya indicado el mdico. Esto es importante.  SOLICITE AYUDA SI:  El dolor, la inflamacin (hinchazn) o el enrojecimiento en el rea del quiste empeoran.  Observa que sale lquido o pus del quiste.  Tiene fiebre. Esta informacin no tiene Marine scientist el consejo del mdico. Asegrese de hacerle al mdico cualquier pregunta que tenga. Document Released: 08/08/2010 Document Revised: 11/20/2014 Document Reviewed: 02/19/2014 Elsevier Interactive Patient Education  2017 [place] rica en fibra (High-Fiber Diet) Jodi [name], tambin llamada fibra  dietaria, es un tipo de carbohidrato que se encuentra en las frutas, las verduras, los cereales integrales y los frijoles. Una dieta rica en fibra puede tener muchos beneficios para la salud. El mdico puede recomendar una dieta rica en fibra para ayudar a:  Contractor. La fibra puede hacer que defeque con ms frecuencia.  Disminuir el nivel de colesterol.  [place] hemorroides, la diverticulosis no complicada o el sndrome del intestino irritable.  Evitar comer en exceso como parte de un plan para bajar de peso.  Evitar cardiopatas, la diabetes tipo 2 y ciertos cnceres. EN QU CONSISTE EL PLAN? El consumo diario recomendado de fibra incluye lo siguiente:  38gramos para hombres menores de 33 aos.  30gramos para hombres mayores de 50 aos.  25gramos para mujeres menores de 50 aos.  21gramos para mujeres mayores de 50 aos. Puede lograr el consumo diario recomendado de fibra si come una variedad de frutas, verduras, cereales y frijoles. El mdico tambin puede recomendar un suplemento de fibra si no es posible obtener suficiente fibra a travs de la dieta. QU DEBO SABER ACERCA DE [place]?  La eficacia de los suplementos de [place] no ha sido estudiada [place], de modo que es mejor obtener fibra a travs de los alimentos.  Verifique siempre el contenido de fibra en la etiqueta de informacin nutricional de los alimentos preenvasados. Busque alimentos que contengan al menos 5gramos de fibra por porcin.  Consulte al nutricionista si tiene preguntas sobre algunos alimentos especficos relacionados con su enfermedad, especialmente si estos alimentos no se mencionan a continuacin.  Aumente el consumo diario de fibra en forma gradual. Aumentar demasiado rpido el consumo de fibra dietaria puede provocar meteorismo, clicos o gases.  Beber abundante agua. El [place] a Economist. QU ALIMENTOS PUEDO COMER? Cereales Panes integrales.  Multicereales. Avena. Arroz integral. Dwyane Luo. Trigo burgol. Mijo. Muffins de salvado. Palomitas de maz. Galletas de centeno. Verduras Batatas. [place]. Col rizada. [place]as. Repollo. Brcoli. Guisantes. Zanahorias. [place]. Frutas Frutos rojos. Peras. Manzanas. Naranjas [place]. Ciruelas y pasas. Higos secos. Carnes y otras fuentes de protenas Frijoles blancos, colorados, pintos y porotos de soja. Guisantes secos. Lentejas. Frutos secos y semillas. Lcteos Yogur fortificado con Pharmacist, hospital. Bebidas Leche de soja fortificada con Fredderick [place]. Jugo de naranja fortificado con Fredderick [place]. Otros Barras de [place]. Los artculos mencionados arriba pueden no ser [employer] de las bebidas o los alimentos recomendados. Comunquese con el nutricionista para conocer ms opciones. QU ALIMENTOS NO SE RECOMIENDAN? Cereales Pan blanco. Pastas hechas con Letitia Neri. Arroz blanco. Verduras Papas fritas. Verduras enlatadas. Verduras bien cocidas. Frutas Jugo de frutas. Frutas cocidas coladas. Carnes y otras fuentes de protenas Cortes de carne con Lobbyist. Aves o pescados fritos. Lcteos Leche. Yogur. Queso crema. Rite Aid. Bebidas Gaseosas. Otros Tortas y pasteles. [place] y aceites. Los artculos mencionados arriba pueden no ser [employer] de las bebidas y los alimentos que se Higher education careers adviser. Comunquese con el nutricionista para obtener ms informacin. [place]  RICOS EN FIBRA EN LA DIETA  Consuma una gran variedad de alimentos ricos en fibra.  Asegrese de que la mitad de todos los cereales consumidos cada da sean cereales integrales.  Reemplace los panes y cereales hechos de harina refinada o harina blanca por panes y cereales integrales.  Reemplace el arroz blanco por arroz integral, trigo burgol o mijo.  Comience Games developer con un desayuno rico en [place], como un cereal que contenga al menos 5gramos de fibra por porcin.  Use guisantes en lugar  de carne en las sopas, ensaladas o pastas.  Coma bocadillos ricos en fibra, como frutos rojos, verduras crudas, frutos secos o palomitas de maz. Esta informacin no tiene Marine scientist el consejo del mdico. Asegrese de hacerle al mdico cualquier pregunta que tenga. Document Released: 07/06/2005 Document Revised: 10/28/2015 Document Reviewed: 12/19/2013 Elsevier Interactive Patient Education  2017 [employer].

## 2016-10-05 NOTE — Progress Notes (Signed)
Jodi [name], is a 71 y.o. female  IHW:[id number]  [id number]:[id number]  DOB - March 18, 1946  Chief Complaint  Patient presents with  . Follow-up        Subjective:   Jodi [name] is a 71 y.o. female here today for a follow up visit, last seen 04/28/16. Pt currently menopausal, has not been sexually active x years.  She c/o of a bump next to her clitoris about 2 wks now - which is mildly tender when she touches it, mild dysuria. She states she has never had a papsmear.  Of note, hx of iron def anemia, not currently taking iron b/c became very constipated.  Noted diverticulosis only on colonoscopy by Dr Jilda Roche [facility] 07/30/16 - she brought in the report to [place].  Taking all meds as prescribed. Nervous about wellwomen exam.  Patient has No headache, No chest pain, No abdominal pain - No Nausea, No new weakness tingling or numbness, No Cough - SOB.  No problems updated.  ALLERGIES: Allergies  Allergen Reactions  . Cortisone     Rash, swelling  . Gabapentin Nausea Only    With dizziness  . Pork-Derived Products     Rash, joint pain  . Shrimp [Shellfish Allergy]     Rash, joint pain, GI pain    PAST MEDICAL HISTORY: Past Medical History:  Diagnosis Date  . Anemia   . Diabetes mellitus without complication ([place])   . Hypertension   . Insomnia     MEDICATIONS AT HOME: Prior to Admission medications   Medication Sig Start Date End Date Taking? Authorizing Provider  ACCU-CHEK SOFTCLIX LANCETS lancets Use as instructed to test blood glucose twice daily. 07/15/16   Maren Reamer, MD  Blood Glucose Monitoring Suppl (ACCU-CHEK AVIVA PLUS) w/Device KIT To test blood glucose twice daily. 07/15/16   Maren Reamer, MD  dexlansoprazole (DEXILANT) 60 MG capsule Take 60 mg by mouth daily.    Historical Provider, MD  diclofenac sodium (VOLTAREN) 1 % GEL Apply 2 g topically 4 (four) times daily. 04/28/16   Maren Reamer, MD  Elastic Bandages & Supports (LUMBAR BACK  BRACE/SUPPORT PAD) MISC 1 application by Does not apply route daily. 04/02/16   Maren Reamer, MD  ferrous gluconate (FERGON) 324 MG tablet Take 1 tablet (324 mg total) by mouth 3 (three) times daily with meals. Patient not taking: Reported on 10/05/2016 04/06/16   Maren Reamer, MD  glucose blood (ACCU-CHEK AVIVA PLUS) test strip Use as instructed to test blood glucose 2 times daily. 07/15/16   Maren Reamer, MD  Iron-FA-B Cmp-C-Biot-Probiotic (FUSION PLUS) CAPS Take by mouth daily.    Historical Provider, MD  Lancet Devices [facility]) lancets Use as instructed to test blood glucose twice daily. 07/15/16   Maren Reamer, MD  metFORMIN (GLUCOPHAGE) 500 MG tablet Take 1 tablet (500 mg total) by mouth 2 (two) times daily with a meal. 10/05/16   Maren Reamer, MD  polyethylene glycol powder (GLYCOLAX/MIRALAX) powder Take 17 g by mouth 2 (two) times daily as needed. 10/05/16   Maren Reamer, MD  senna (SENOKOT) 8.6 MG TABS tablet Take 1 tablet (8.6 mg total) by mouth daily as needed for mild constipation. 04/06/16   Maren Reamer, MD  senna-docusate (SENOKOT-S) 8.6-50 MG tablet Take 1 tablet by mouth at bedtime as needed for mild constipation. 10/05/16   Maren Reamer, MD  solifenacin (VESICARE) 10 MG tablet Take 1 tablet (10 mg total) by mouth  daily. 10/05/16   Maren Reamer, MD  traMADol (ULTRAM) 50 MG tablet Take 1 tablet (50 mg total) by mouth every 8 (eight) hours as needed for severe pain. Patient not taking: Reported on 04/28/2016 04/08/16   Maren Reamer, MD  valsartan (DIOVAN) 40 MG tablet Take 1 tablet (40 mg total) by mouth daily. 10/05/16   Maren Reamer, MD  VITAMIN B1-B12 IJ Inject as directed once a week.    Historical Provider, MD     Objective:   Vitals:   10/05/16 1455  BP: (!) 159/92  Pulse: 86  Resp: 16  Temp: 99 F (37.2 C)  TempSrc: Oral  SpO2: 96%  Weight: 146 lb 3.2 oz (66.3 kg)   cma assisted in wellwomen exam.  Exam General  appearance : Awake, alert, not in any distress. Speech Clear. Not toxic looking HEENT: Atraumatic and Normocephalic, pupils equally reactive to light. Neck: supple, no JVD.  Chest:Good air entry bilaterally, no added sounds. CVS: S1 S2 regular, no murmurs/gallups or rubs. Abdomen: Bowel sounds active, Non tender and not distended with no gaurding, rigidity or rebound. Pelvic Exam: Cervix normal in appearance, atrophied, external genitalia normal, no adnexal masses or tenderness, no cervical motion tenderness, rectovaginal septum normal, uterus normal size, shape, and consistency and vagina normal without discharge. +vaginal atrophy noted, easy bleeding w/ slight instrumentation and specimen collection.  She has knot noted left lat to clitoris, nttp, hard on palpation.  - suspect bartholin cyst.  Extremities: B/L Lower Ext shows no edema, both legs are warm to touch Neurology: Awake alert, and oriented X 3, CN II-XII grossly intact, Non focal Skin:No Rash  Data Review Lab Results  Component Value Date   HGBA1C 6.1 04/02/2016    Depression screen [facility] 2/9 10/05/2016 05/08/2016 04/28/2016 04/02/2016  Decreased Interest 3 0 3 3  Down, Depressed, Hopeless 3 0 2 3  PHQ - 2 Score 6 0 5 6  Altered sleeping 3 0 3 3  Tired, decreased energy 3 0 3 3  Change in appetite 2 0 3 2  Feeling bad or failure about yourself  2 0 2 3  Trouble concentrating 2 0 3 3  Moving slowly or fidgety/restless 3 0 3 2  Suicidal thoughts 0 0 0 0  PHQ-9 Score 21 0 22 22      Assessment & Plan   1. Diverticulosis of colon Encouraged high fiber diet, certainly iron will cause some constipation. Will rechk cbc to see how much iron she needs - noted on colonoscopy 07/30/16 - per Dr Rodman Pickle, will make copy of report.  2. Bartholin cyst - recd sitz bath as much as able, info provided- - papsmear done today as well, atrophy noted, chk hpv/hsv/g&c/candida and bv.  3. Iron deficiency anemia, unspecified iron  deficiency anemia type - has not been taking iron due to constipation. - hold off on iron dose until cbc - BASIC METABOLIC PANEL WITH GFR - CBC with Differential - dw pt fiber, prn senna/miralax rx given as well.  4. Dysuria - Urinalysis Dipstick  - neg  5. Prediabetes - better controlled, decrease metformin to 500 qd. - POCT glucose (manual entry) 106 - POCT glycosylated hemoglobin (Hb A1C) 5.6  ( - last 6.1- 04/02/16)  6. Pap smear for cervical cancer screening See above. - PAP, Thin Prep w/HPV rflx HPV Type 16/18 Randell Loop) - Cytology - PAP     Patient have been counseled extensively about nutrition and exercise  Return in about  3 months (around 01/05/2017), or if symptoms worsen or fail to improve.  The patient was given clear instructions to go to ER or return to medical center if symptoms don't improve, worsen or new problems develop. The patient verbalized understanding. The patient was told to call to get lab results if they haven't heard anything in the next week.   This note has been created with Surveyor, quantity. Any transcriptional errors are unintentional.   Maren Reamer, MD, [place] [facility], [place]   10/05/2016, 3:25 PM

## 2016-10-06 LAB — CERVICOVAGINAL ANCILLARY ONLY: Wet Prep (BD Affirm): NEGATIVE

## 2016-10-07 ENCOUNTER — Telehealth: Payer: Self-pay | Admitting: Internal Medicine

## 2016-10-07 ENCOUNTER — Other Ambulatory Visit: Payer: Self-pay

## 2016-10-07 MED ORDER — METFORMIN HCL 500 MG PO TABS: 500.0000 mg | ORAL_TABLET | Freq: Every day | ORAL | 3 refills | Status: DC

## 2016-10-07 MED ORDER — POLYETHYLENE GLYCOL 3350 17 GM/SCOOP PO POWD: 17.0000 g | Freq: Two times a day (BID) | ORAL | 1 refills | Status: DC | PRN

## 2016-10-07 MED ORDER — SOLIFENACIN SUCCINATE 10 MG PO TABS: 10.0000 mg | ORAL_TABLET | Freq: Every day | ORAL | 2 refills | Status: DC

## 2016-10-07 MED ORDER — VALSARTAN 40 MG PO TABS: 40.0000 mg | ORAL_TABLET | Freq: Every day | ORAL | 2 refills | Status: DC

## 2016-10-07 MED ORDER — SENNOSIDES-DOCUSATE SODIUM 8.6-50 MG PO TABS: 1.0000 | ORAL_TABLET | Freq: Every evening | ORAL | 3 refills | Status: DC | PRN

## 2016-10-07 NOTE — Telephone Encounter (Signed)
[facility] [address]

## 2016-10-07 NOTE — Telephone Encounter (Signed)
Patient called the office to speak with nurse regarding her medication. Pt stated that during appt with PCP, PCP prescirbed certain medication. When patient went to pick up medication the pharmacy informed her that nothing was called in. [facility] [address]  Thank you.

## 2016-10-09 LAB — CYTOLOGY - PAP
Chlamydia: NEGATIVE
DIAGNOSIS: NEGATIVE
HPV: NOT DETECTED
Neisseria Gonorrhea: NEGATIVE

## 2016-10-09 LAB — CERVICOVAGINAL ANCILLARY ONLY: Herpes: NEGATIVE

## 2016-10-10 ENCOUNTER — Other Ambulatory Visit: Payer: Self-pay | Admitting: Internal Medicine

## 2016-10-10 MED ORDER — FUSION PLUS PO CAPS: 1.0000 | ORAL_CAPSULE | ORAL | 2 refills | Status: DC

## 2016-10-19 ENCOUNTER — Telehealth: Payer: Self-pay | Admitting: Internal Medicine

## 2016-10-19 ENCOUNTER — Telehealth: Payer: Self-pay | Admitting: *Deleted

## 2016-10-19 ENCOUNTER — Ambulatory Visit: Payer: Medicare Other | Attending: Internal Medicine | Admitting: *Deleted

## 2016-10-19 VITALS — BP 130/66

## 2016-10-19 DIAGNOSIS — I10 Essential (primary) hypertension: Secondary | ICD-10-CM | POA: Diagnosis not present

## 2016-10-19 MED ORDER — DICLOFENAC SODIUM 1 % TD GEL: 2.0000 g | Freq: Four times a day (QID) | TRANSDERMAL | 1 refills | Status: DC

## 2016-10-19 NOTE — Progress Notes (Signed)
Pt here for BP check,  BP taken manually, BP reading 130/66 Pt denies chest pain, headache, or SOB. Pt c/o pain in neck, shoulders, and knees. Request refill for Voltaren gel. RF sent to pharmacy requested[facility].  Nurse visit will be routed to provider. Maisie Fus, BSN

## 2016-10-19 NOTE — Telephone Encounter (Signed)
Patient came to the office to request medication refill for Ibeprofen 800mg . [facility] [address]  Thank you.

## 2016-10-26 MED ORDER — IBUPROFEN 800 MG PO TABS: 800.0000 mg | ORAL_TABLET | Freq: Three times a day (TID) | ORAL | 2 refills | Status: DC | PRN

## 2016-10-26 NOTE — Telephone Encounter (Signed)
[facility]

## 2016-11-20 ENCOUNTER — Encounter (HOSPITAL_COMMUNITY): Payer: Self-pay | Admitting: *Deleted

## 2016-11-20 ENCOUNTER — Ambulatory Visit (HOSPITAL_COMMUNITY)
Admission: EM | Admit: 2016-11-20 | Discharge: 2016-11-20 | Disposition: A | Discharge status: Home / Self Care | Payer: Medicare Other | Attending: Family Medicine | Admitting: Family Medicine

## 2016-11-20 DIAGNOSIS — B029 Zoster without complications: Secondary | ICD-10-CM

## 2016-11-20 MED ORDER — ACYCLOVIR 800 MG PO TABS: 800.0000 mg | ORAL_TABLET | Freq: Four times a day (QID) | ORAL | 0 refills | Status: DC

## 2016-11-20 MED ORDER — HYDROCODONE-ACETAMINOPHEN 5-325 MG PO TABS: 2.0000 | ORAL_TABLET | ORAL | 0 refills | Status: DC | PRN

## 2016-11-20 NOTE — ED Triage Notes (Signed)
Here for rash on abd onset 1 week associated w/BA  A&O x4... NAD

## 2016-11-20 NOTE — Discharge Instructions (Signed)
Buy Zyrtec for allergies. See your Physicain as scheduled

## 2016-11-20 NOTE — ED Provider Notes (Signed)
CSN: [id number]     Arrival date & time 11/20/16  1130 History   None    Chief Complaint  Patient presents with  . Rash   (Consider location/radiation/quality/duration/timing/severity/associated sxs/prior Treatment) The history is provided by the patient. The history is limited by a language barrier. A language interpreter was used.  Rash  Location:  Full body Quality: blistering   Severity:  Moderate Onset quality:  Sudden Timing:  Constant Progression:  Worsening Chronicity:  New Context: exposure to similar rash   Relieved by:  Nothing Worsened by:  Nothing Ineffective treatments:  None tried Associated symptoms: abdominal pain   Pt complains of a rash on her abdomen. Pt reports area is painful  Past Medical History:  Diagnosis Date  . Anemia   . Diabetes mellitus without complication ([place])   . Hypertension   . Insomnia    Past Surgical History:  Procedure Laterality Date  . CESAREAN SECTION    . SHOULDER SURGERY Left 2010   History reviewed. No pertinent family history. Social History  Substance Use Topics  . Smoking status: Never Smoker  . Smokeless tobacco: Never Used  . Alcohol use No   OB History    No data available     Review of Systems  Gastrointestinal: Positive for abdominal pain.  Skin: Positive for rash.  All other systems reviewed and are negative.   Allergies  Cortisone; Gabapentin; Pork-derived products; and Shrimp [shellfish allergy]  Home Medications   Prior to Admission medications   Medication Sig Start Date End Date Taking? Authorizing Provider  Lancet Devices [facility]) lancets Use as instructed to test blood glucose twice daily. 07/15/16  Yes Maren Reamer, MD  metFORMIN (GLUCOPHAGE) 500 MG tablet Take 1 tablet (500 mg total) by mouth daily with breakfast. 10/07/16  Yes Maren Reamer, MD  polyethylene glycol powder (GLYCOLAX/MIRALAX) powder Take 17 g by mouth 2 (two) times daily as needed. 10/07/16  Yes Maren Reamer, MD  senna (SENOKOT) 8.6 MG TABS tablet Take 1 tablet (8.6 mg total) by mouth daily as needed for mild constipation. 04/06/16  Yes Maren Reamer, MD  senna-docusate (SENOKOT-S) 8.6-50 MG tablet Take 1 tablet by mouth at bedtime as needed for mild constipation. 10/07/16  Yes Maren Reamer, MD  solifenacin (VESICARE) 10 MG tablet Take 1 tablet (10 mg total) by mouth daily. 10/07/16  Yes Maren Reamer, MD  valsartan (DIOVAN) 40 MG tablet Take 1 tablet (40 mg total) by mouth daily. 10/07/16  Yes Maren Reamer, MD  VITAMIN B1-B12 IJ Inject as directed once a week.   Yes Historical Provider, MD  ACCU-CHEK SOFTCLIX LANCETS lancets Use as instructed to test blood glucose twice daily. 07/15/16   Maren Reamer, MD  acyclovir (ZOVIRAX) 800 MG tablet Take 1 tablet (800 mg total) by mouth 4 (four) times daily. 11/20/16   Fransico Meadow, PA-C  Blood Glucose Monitoring Suppl (ACCU-CHEK AVIVA PLUS) w/Device KIT To test blood glucose twice daily. 07/15/16   Maren Reamer, MD  dexlansoprazole (DEXILANT) 60 MG capsule Take 60 mg by mouth daily.    Historical Provider, MD  diclofenac sodium (VOLTAREN) 1 % GEL Apply 2 g topically 4 (four) times daily. 10/19/16   Maren Reamer, MD  Elastic Bandages & Supports (LUMBAR BACK BRACE/SUPPORT PAD) MISC 1 application by Does not apply route daily. 04/02/16   Maren Reamer, MD  ferrous gluconate (FERGON) 324 MG tablet Take 1 tablet (324 mg total) by  mouth 3 (three) times daily with meals. Patient not taking: Reported on 10/05/2016 04/06/16   Maren Reamer, MD  glucose blood (ACCU-CHEK AVIVA PLUS) test strip Use as instructed to test blood glucose 2 times daily. 07/15/16   Maren Reamer, MD  HYDROcodone-acetaminophen (NORCO/VICODIN) 5-325 MG tablet Take 2 tablets by mouth every 4 (four) hours as needed. 11/20/16   Fransico Meadow, PA-C  ibuprofen (ADVIL,MOTRIN) 800 MG tablet Take 1 tablet (800 mg total) by mouth every 8 (eight) hours as needed. Take with  food., do not take on empty stomach 10/26/16   Maren Reamer, MD  Iron-FA-B Cmp-C-Biot-Probiotic (FUSION PLUS) CAPS Take 1 tablet by mouth every other day. As tolerated 10/10/16   Maren Reamer, MD   Meds Ordered and Administered this Visit  Medications - No data to display  BP 137/78 (BP Location: Left Arm)   Pulse (!) 104   Temp 98.9 F (37.2 C) (Oral)   Resp 20   SpO2 100%  No data found.   Physical Exam  Constitutional: She appears well-developed and well-nourished. No distress.  HENT:  Head: Normocephalic and atraumatic.  Eyes: Conjunctivae are normal.  Neck: Neck supple.  Cardiovascular: Normal rate and regular rhythm.   No murmur heard. Pulmonary/Chest: Effort normal and breath sounds normal. No respiratory distress.  Abdominal: Soft. There is no tenderness.  2cm area of blistering right abdomen  Musculoskeletal: She exhibits no edema.  Neurological: She is alert.  Skin: Skin is warm and dry.  Psychiatric: She has a normal mood and affect.  Nursing note and vitals reviewed.   Urgent Care Course     Procedures (including critical care time)  Labs Review Labs Reviewed - No data to display  Imaging Review No results found.   Visual Acuity Review  Right Eye Distance:   Left Eye Distance:   Bilateral Distance:    Right Eye Near:   Left Eye Near:    Bilateral Near:         MDM   1. Herpes zoster without complication    An After Visit Summary was printed and given to the patient. Meds ordered this encounter  Medications  . acyclovir (ZOVIRAX) 800 MG tablet    Sig: Take 1 tablet (800 mg total) by mouth 4 (four) times daily.    Dispense:  40 tablet    Refill:  0    Order Specific Question:   Supervising Provider    Answer:   Sherlene Shams [[id number]]  . HYDROcodone-acetaminophen (NORCO/VICODIN) 5-325 MG tablet    Sig: Take 2 tablets by mouth every 4 (four) hours as needed.    Dispense:  16 tablet    Refill:  0    Order Specific Question:    Supervising Provider    Answer:   Sherlene Shams [[id number]]      Fransico Meadow, PA-C 11/20/16 1209

## 2016-11-24 ENCOUNTER — Encounter: Payer: Self-pay | Admitting: Internal Medicine

## 2016-11-24 ENCOUNTER — Ambulatory Visit: Payer: Medicare Other | Attending: Internal Medicine | Admitting: Internal Medicine

## 2016-11-24 VITALS — BP 120/79 | HR 88 | Temp 98.1°F | Resp 16 | Wt 142.8 lb

## 2016-11-24 DIAGNOSIS — D229 Melanocytic nevi, unspecified: Secondary | ICD-10-CM | POA: Diagnosis not present

## 2016-11-24 DIAGNOSIS — R7303 Prediabetes: Secondary | ICD-10-CM

## 2016-11-24 DIAGNOSIS — E2839 Other primary ovarian failure: Secondary | ICD-10-CM

## 2016-11-24 DIAGNOSIS — Z1321 Encounter for screening for nutritional disorder: Secondary | ICD-10-CM | POA: Diagnosis not present

## 2016-11-24 DIAGNOSIS — Z7984 Long term (current) use of oral hypoglycemic drugs: Secondary | ICD-10-CM | POA: Insufficient documentation

## 2016-11-24 DIAGNOSIS — N649 Disorder of breast, unspecified: Secondary | ICD-10-CM

## 2016-11-24 DIAGNOSIS — E559 Vitamin D deficiency, unspecified: Secondary | ICD-10-CM

## 2016-11-24 DIAGNOSIS — B029 Zoster without complications: Secondary | ICD-10-CM | POA: Diagnosis not present

## 2016-11-24 DIAGNOSIS — R21 Rash and other nonspecific skin eruption: Secondary | ICD-10-CM | POA: Diagnosis present

## 2016-11-24 LAB — GLUCOSE, POCT (MANUAL RESULT ENTRY): POC GLUCOSE: 166 mg/dL — AB (ref 70–99)

## 2016-11-24 MED ORDER — TRIAMCINOLONE ACETONIDE 0.5 % EX OINT: 1.0000 "application " | TOPICAL_OINTMENT | Freq: Two times a day (BID) | CUTANEOUS | 0 refills | Status: DC

## 2016-11-24 NOTE — Patient Instructions (Addendum)
Calcium 1200mg  /day  - over the counter - vit D - ? Dose after lab  - remember to get your mammogram!!!     Culebrilla (Shingles) La culebrilla es una infeccin que causa una erupcin dolorosa en la piel y ampollas llenas de lquido. La culebrilla est causada por el mismo virus que causa la varicela. Solo se manifiesta en personas que:  Tuvieron varicela.  Recibieron la vacuna contra la varicela. (Esto es poco frecuente). Los primeros sntomas de la culebrilla pueden ser picazn, hormigueo o dolor en una zona de la piel. Luego de [employer] o semanas aparecer una erupcin cutnea. La erupcin suele presentarse en un solo lado del cuerpo en un patrn con forma de cinto o de banda. Con el tiempo, la erupcin se convierte en ampollas llenas de lquido que se abren, forman costras y se secan. Los medicamentos pueden [employer] siguientes efectos:  Ayudar a Financial controller.  Lograr una recuperacin ms rpida.  Prevenir problemas a Barrister's clerk. [place] los medicamentos solamente como se lo haya indicado el mdico.  Aplique una crema para calmar la picazn o con anestesia en la zona afectada, como se lo haya indicado el mdico. Cuidado de la erupcin y las ampollas  Tome un bao de agua fra o aplique compresas fras en la zona de la erupcin o las ampollas como se lo haya indicado el mdico. Esto Best boy y Cabin crew.  Mantenga la zona de la erupcin cubierta con una venda (vendaje). Use ropas sueltas.  Mantenga limpia la zona de la erupcin y las ampollas con [place] y agua fra, o como se lo haya indicado el mdico.  Controle la erupcin todos los das para detectar signos de infeccin. Estos signos incluyen enrojecimiento, hinchazn o dolor que perduran o empeoran.  No pellizque las ampollas.  No se rasque la zona de la erupcin. Instrucciones generales  Haga reposo tal como le indic el mdico.  Concurra a todas las visitas  de control como se lo haya indicado el mdico. Esto es importante.  Hasta tanto las ampollas formen costras, la infeccin puede causar varicela en las personas que nunca la tuvieron o no se vacunaron contra la varicela. Para impedir que esto sucede, evite tocar a Producer, television/film/video o estar cerca de otras personas, en especial:  Bebs.  Embarazadas.  Nios que Haematologist.  Personas mayores que han recibido un trasplante.  Personas con enfermedades crnicas, como leucemia y sida. SOLICITE AYUDA SI:  El dolor no mejora con medicamentos.  El dolor no mejora despus de que la erupcin desaparece.  La erupcin parece infectada. Los signos de infeccin son los siguiente:  Enrojecimiento.  Hinchazn.  Dolor que perdura o [place]. SOLICITE AYUDA DE INMEDIATO SI:  La erupcin aparece en el rostro o la nariz.  Tiene dolor en el rostro, en la zona de los ojos o prdida de la sensibilidad en un lado del rostro.  Siente dolor o un zumbido en el odo.  Tiene prdida del gusto.  La afeccin empeora. Esta informacin no tiene Marine scientist el consejo del mdico. Asegrese de hacerle al mdico cualquier pregunta que tenga. Document Released: 10/02/2008 Document Revised: 10/28/2015 Document Reviewed: 04/17/2014 Elsevier Interactive Patient Education  2017 [employer].  -

## 2016-11-24 NOTE — Progress Notes (Signed)
Jodi [name], is a 71 y.o. female  JEH:[id number]  [id number]  DOB - [birth year]  Chief Complaint  Patient presents with  . Rash        Subjective:   Jodi [name] is a 71 y.o. female here today for a follow up visit, last seen 10/05/16, for predm/htn. Of note, seen in ed 11/20/16 for shingles on her abd, started on acyclovir, which she is taking. She notes no pain, but the rash does itch. Has not spread or gotten worse.  Denies any contact to new substances or lotions.  Also mentions that she has a mole on her left nipple that looks like it is growing. She had it bx around Jan 2017 by derm, was benign at time. But it looks like it is growing on again.   She still has not had her MM, but has it scheduled for this June.   Patient has No headache, No chest pain, No abdominal pain - No Nausea, No new weakness tingling or numbness, No Cough - SOB.  Interpreter was used to communicate directly with patient for the entire encounter including providing detailed patient instructions.   No problems updated.  ALLERGIES: Allergies  Allergen Reactions  . Cortisone     Rash, swelling  . Gabapentin Nausea Only    With dizziness  . Pork-Derived Products     Rash, joint pain  . Shrimp [Shellfish Allergy]     Rash, joint pain, GI pain    PAST MEDICAL HISTORY: Past Medical History:  Diagnosis Date  . Anemia   . Diabetes mellitus without complication ([place])   . Hypertension   . Insomnia     MEDICATIONS AT HOME: Prior to Admission medications   Medication Sig Start Date End Date Taking? Authorizing Provider  ACCU-CHEK SOFTCLIX LANCETS lancets Use as instructed to test blood glucose twice daily. 07/15/16   Maren Reamer, MD  acyclovir (ZOVIRAX) 800 MG tablet Take 1 tablet (800 mg total) by mouth 4 (four) times daily. 11/20/16   Fransico Meadow, PA-C  Blood Glucose Monitoring Suppl (ACCU-CHEK AVIVA PLUS) w/Device KIT To test blood glucose twice daily. 07/15/16    Maren Reamer, MD  dexlansoprazole (DEXILANT) 60 MG capsule Take 60 mg by mouth daily.    [provider]  diclofenac sodium (VOLTAREN) 1 % GEL Apply 2 g topically 4 (four) times daily. 10/19/16   Maren Reamer, MD  Elastic Bandages & Supports (LUMBAR BACK BRACE/SUPPORT PAD) MISC 1 application by Does not apply route daily. 04/02/16   Maren Reamer, MD  ferrous gluconate (FERGON) 324 MG tablet Take 1 tablet (324 mg total) by mouth 3 (three) times daily with meals. Patient not taking: Reported on 10/05/2016 04/06/16   Lottie Mussel T, MD  glucose blood (ACCU-CHEK AVIVA PLUS) test strip Use as instructed to test blood glucose 2 times daily. 07/15/16   Maren Reamer, MD  HYDROcodone-acetaminophen (NORCO/VICODIN) 5-325 MG tablet Take 2 tablets by mouth every 4 (four) hours as needed. 11/20/16   Fransico Meadow, PA-C  ibuprofen (ADVIL,MOTRIN) 800 MG tablet Take 1 tablet (800 mg total) by mouth every 8 (eight) hours as needed. Take with food., do not take on empty stomach 10/26/16   Mykell Rawl T, MD  Iron-FA-B Cmp-C-Biot-Probiotic (FUSION PLUS) CAPS Take 1 tablet by mouth every other day. As tolerated 10/10/16   Bellamy Judson T, MD  Lancet Devices [facility]) lancets Use as instructed to test blood glucose twice daily. 07/15/16   Ashland Osmer,  Leda Quail, MD  metFORMIN (GLUCOPHAGE) 500 MG tablet Take 1 tablet (500 mg total) by mouth daily with breakfast. 10/07/16   Anwyn Kriegel T, MD  polyethylene glycol powder (GLYCOLAX/MIRALAX) powder Take 17 g by mouth 2 (two) times daily as needed. 10/07/16   Maren Reamer, MD  senna (SENOKOT) 8.6 MG TABS tablet Take 1 tablet (8.6 mg total) by mouth daily as needed for mild constipation. 04/06/16   Edouard Gikas T, MD  senna-docusate (SENOKOT-S) 8.6-50 MG tablet Take 1 tablet by mouth at bedtime as needed for mild constipation. 10/07/16   Maren Reamer, MD  solifenacin (VESICARE) 10 MG tablet Take 1 tablet (10 mg total) by mouth daily.  10/07/16   Maren Reamer, MD  triamcinolone ointment (KENALOG) 0.5 % Apply 1 application topically 2 (two) times daily. 11/24/16   Maren Reamer, MD  valsartan (DIOVAN) 40 MG tablet Take 1 tablet (40 mg total) by mouth daily. 10/07/16   Maren Reamer, MD  VITAMIN B1-B12 IJ Inject as directed once a week.    [provider]     Objective:   Vitals:   11/24/16 1553  BP: 120/79  Pulse: 88  Resp: 16  Temp: 98.1 F (36.7 C)  TempSrc: Oral  SpO2: 95%  Weight: 142 lb 12.8 oz (64.8 kg)    Exam General appearance : Awake, alert, not in any distress. Speech Clear. Not toxic looking, pleasant, obese HEENT: Atraumatic and Normocephalic, pupils equally reactive to light. Neck: supple, no JVD.  Left breast exam; she has several atypical nevi, around her areola, the largest of which is about 1 cm at 6 o'clock, and some smaller ones about 2-35m at 2 & 4 O'clock. No palpable masses appreciated. No findings of nipple retraction. Chest:Good air entry bilaterally, no added sounds. CVS: S1 S2 regular, no murmurs/gallups or rubs. Abdomen: Bowel sounds active, Non tender and not distended with no gaurding, rigidity or rebound.  She has vesicular/macular rash on the right lower abd.  Mild erythema, nttp. Extremities: B/L Lower Ext shows no edema, both legs are warm to touch Neurology: Awake alert, and oriented X 3, CN II-XII grossly intact, Non focal Skin:No Rash  Data Review Lab Results  Component Value Date   HGBA1C 5.6 10/05/2016   HGBA1C 6.1 04/02/2016    [facility] 10/05/2016 05/08/2016 04/28/2016 04/02/2016  Decreased Interest 3 0 3 3  Down, Depressed, Hopeless 3 0 2 3  PHQ - 2 Score 6 0 5 6  Altered sleeping 3 0 3 3  Tired, decreased energy 3 0 3 3  Change in appetite 2 0 3 2  Feeling bad or failure about yourself  2 0 2 3  Trouble concentrating 2 0 3 3  Moving slowly or fidgety/restless 3 0 3 2  Suicidal thoughts 0 0 0 0  PHQ-9 Score 21 0 22 22       Assessment & Plan   1. Herpes zoster without complication Not hurting, recd complete her acyclovir course to prevent postherpetic neuralgi - trial prn kenolog cream for pruritis.  2. Atypical nevi, around left nipple region - priox bx jan 2017 per pt, which was benign - Ambulatory referral to Dermatology - of note, pt told me her MM not scheduled til June, given these nipple findings, would recd changing to TOMO/left breast [place] better pictures.  3. Prediabetes Continue metformin. - POCT glucose (manual entry)  4. Encounter for vitamin deficiency screening, hx of vit d def. -  Vitamin D, 25-hydroxy  5. Estrogen deficiency - DG Bone Density; Future     Patient have been counseled extensively about nutrition and exercise  Return in about 3 months (around 02/24/2017), or if symptoms worsen or fail to improve.  The patient was given clear instructions to go to ER or return to medical center if symptoms don't improve, worsen or new problems develop. The patient verbalized understanding. The patient was told to call to get lab results if they haven't heard anything in the next week.   This note has been created with Surveyor, quantity. Any transcriptional errors are unintentional.   Maren Reamer, MD, [place] and [facility] [place], [place]   11/24/2016, 4:34 PM

## 2016-11-25 LAB — VITAMIN D 25 HYDROXY (VIT D DEFICIENCY, FRACTURES): Vit D, 25-Hydroxy: 27.5 ng/mL — ABNORMAL LOW (ref 30.0–100.0)

## 2016-11-30 ENCOUNTER — Telehealth: Payer: Self-pay

## 2016-11-30 NOTE — Telephone Encounter (Signed)
Contacted pt to go over lab results pt didn't answer lvm asking pt to give me a call at her earliest convenience   If pt calls back please give results:  vit d levels not too bad, but low. Needs to take Calcium 1200mg  /day for bone health and Vit D 5,000 IU daily otc.

## 2016-12-03 ENCOUNTER — Encounter: Payer: Self-pay | Admitting: Internal Medicine

## 2016-12-04 ENCOUNTER — Encounter: Payer: Self-pay | Admitting: Internal Medicine

## 2016-12-07 ENCOUNTER — Encounter: Payer: Self-pay | Admitting: Internal Medicine

## 2016-12-15 NOTE — Telephone Encounter (Signed)
Med RF

## 2016-12-31 ENCOUNTER — Telehealth: Payer: Self-pay | Admitting: Internal Medicine

## 2016-12-31 DIAGNOSIS — M8589 Other specified disorders of bone density and structure, multiple sites: Secondary | ICD-10-CM | POA: Diagnosis not present

## 2016-12-31 DIAGNOSIS — M810 Age-related osteoporosis without current pathological fracture: Secondary | ICD-10-CM | POA: Diagnosis not present

## 2016-12-31 DIAGNOSIS — Z1231 Encounter for screening mammogram for malignant neoplasm of breast: Secondary | ICD-10-CM | POA: Diagnosis not present

## 2016-12-31 MED ORDER — DICLOFENAC SODIUM 1 % TD GEL: 2.0000 g | Freq: Four times a day (QID) | TRANSDERMAL | 0 refills | Status: DC

## 2016-12-31 NOTE — Telephone Encounter (Signed)
Pt. Came to facility requesting a refill on diclofenac sodium (VOLTAREN) 1 % GEL Pt. [facility]. Please f/u

## 2017-01-07 DIAGNOSIS — R922 Inconclusive mammogram: Secondary | ICD-10-CM | POA: Diagnosis not present

## 2017-01-08 ENCOUNTER — Telehealth: Payer: Self-pay | Admitting: Internal Medicine

## 2017-01-08 DIAGNOSIS — E559 Vitamin D deficiency, unspecified: Secondary | ICD-10-CM | POA: Insufficient documentation

## 2017-01-08 DIAGNOSIS — M810 Age-related osteoporosis without current pathological fracture: Secondary | ICD-10-CM | POA: Insufficient documentation

## 2017-01-08 DIAGNOSIS — K219 Gastro-esophageal reflux disease without esophagitis: Secondary | ICD-10-CM | POA: Insufficient documentation

## 2017-01-08 NOTE — Telephone Encounter (Signed)
Phone call placed to patient today to discuss results of recent bone density study. [place] interpreter [phone] was used for this conversation. Patient informed that her recent bone density study reveals that she has osteoporosis mainly at the right femoral neck [T score -2.50]. She has osteopenia at other sites (T score -2.00 left femoral neck, -2.1 of the total spine, -1.50 at the right total femur). Patient endorses compliance with calcium 1200 mg and vitamin D 5000 international units daily. Last vitamin D level was 27 in May of this year. I have asked patient to return to the lab for repeat vitamin D level check. Once I get the results of that I will bring her in to discuss starting bisphosphonate. I also received her mammogram report. Mammogram done 12/31/2016 through [facility]. There was a 5 mm oval nodule in the left breast which was undetermined. Additional views done 01/07/2017 revealed no mammographic evidence of malignancy.

## 2017-01-22 ENCOUNTER — Ambulatory Visit: Payer: Medicare Other | Attending: Family Medicine | Admitting: Family Medicine

## 2017-01-22 ENCOUNTER — Encounter: Payer: Self-pay | Admitting: Family Medicine

## 2017-01-22 VITALS — BP 124/67 | HR 72 | Temp 98.0°F | Resp 18 | Ht 60.0 in | Wt 142.0 lb

## 2017-01-22 DIAGNOSIS — Z7984 Long term (current) use of oral hypoglycemic drugs: Secondary | ICD-10-CM | POA: Diagnosis not present

## 2017-01-22 DIAGNOSIS — K219 Gastro-esophageal reflux disease without esophagitis: Secondary | ICD-10-CM

## 2017-01-22 DIAGNOSIS — G4700 Insomnia, unspecified: Secondary | ICD-10-CM | POA: Diagnosis not present

## 2017-01-22 DIAGNOSIS — M810 Age-related osteoporosis without current pathological fracture: Secondary | ICD-10-CM

## 2017-01-22 DIAGNOSIS — Z79899 Other long term (current) drug therapy: Secondary | ICD-10-CM | POA: Insufficient documentation

## 2017-01-22 DIAGNOSIS — E559 Vitamin D deficiency, unspecified: Secondary | ICD-10-CM

## 2017-01-22 DIAGNOSIS — E119 Type 2 diabetes mellitus without complications: Secondary | ICD-10-CM | POA: Insufficient documentation

## 2017-01-22 DIAGNOSIS — I10 Essential (primary) hypertension: Secondary | ICD-10-CM | POA: Diagnosis not present

## 2017-01-22 DIAGNOSIS — K449 Diaphragmatic hernia without obstruction or gangrene: Secondary | ICD-10-CM | POA: Insufficient documentation

## 2017-01-22 MED ORDER — ALENDRONATE SODIUM 70 MG PO TABS: 70.0000 mg | ORAL_TABLET | ORAL | 11 refills | Status: DC

## 2017-01-22 MED ORDER — DEXLANSOPRAZOLE 60 MG PO CPDR: 60.0000 mg | DELAYED_RELEASE_CAPSULE | Freq: Every day | ORAL | 3 refills | Status: DC

## 2017-01-22 NOTE — Progress Notes (Signed)
Subjective:  Patient ID: Jodi [name], female    DOB: Dec 04, 1945  Age: 71 y.o. MRN: [id number]  CC: Establish Care   HPI Jodi [name] is a 71 year old female with a history of prediabetes who presents today to establish care with me. She was previously followed by Dr. Janne Napoleon.  She recently had a Bone Density study at [facility] and had received a call stating she had osteoporosis. I am unable to locate this report in Epic however notes from Dr. Karle Plumber indicates she did have a diagnosis of osteoporosis with a T score of -2.50 at the right femoral neck, osteopenia at other sites (T score of -2.00 at the left femoral neck, -2.1 at the total spine, -1.5 at the right total femur). She endorses having intermittent pain in her knuckles.  She is requesting a refill of Dexilant which she takes for GERD and hiatal hernia.  Past Medical History:  Diagnosis Date  . Anemia   . Diabetes mellitus without complication ([place])   . Hypertension   . Insomnia     Past Surgical History:  Procedure Laterality Date  . CESAREAN SECTION    . SHOULDER SURGERY Left 2010    Allergies  Allergen Reactions  . Cortisone     Rash, swelling  . Gabapentin Nausea Only    With dizziness  . Pork-Derived Products     Rash, joint pain  . Shrimp [Shellfish Allergy]     Rash, joint pain, GI pain     Outpatient Medications Prior to Visit  Medication Sig Dispense Refill  . ACCU-CHEK SOFTCLIX LANCETS lancets Use as instructed to test blood glucose twice daily. 100 each 12  . acyclovir (ZOVIRAX) 800 MG tablet Take 1 tablet (800 mg total) by mouth 4 (four) times daily. 40 tablet 0  . Blood Glucose Monitoring Suppl (ACCU-CHEK AVIVA PLUS) w/Device KIT To test blood glucose twice daily. 1 kit 0  . diclofenac sodium (VOLTAREN) 1 % GEL Apply 2 g topically 4 (four) times daily. 100 g 0  . Elastic Bandages & Supports (LUMBAR BACK BRACE/SUPPORT PAD) MISC 1 application by Does not apply route daily. 1  each 0  . glucose blood (ACCU-CHEK AVIVA PLUS) test strip Use as instructed to test blood glucose 2 times daily. 100 each 12  . ibuprofen (ADVIL,MOTRIN) 800 MG tablet Take 1 tablet (800 mg total) by mouth every 8 (eight) hours as needed. Take with food., do not take on empty stomach 30 tablet 2  . Iron-FA-B Cmp-C-Biot-Probiotic (FUSION PLUS) CAPS Take 1 tablet by mouth every other day. As tolerated 90 capsule 2  . Lancet Devices (ACCU-CHEK SOFTCLIX) lancets Use as instructed to test blood glucose twice daily. 1 each 0  . metFORMIN (GLUCOPHAGE) 500 MG tablet Take 1 tablet (500 mg total) by mouth daily with breakfast. 90 tablet 3  . polyethylene glycol powder (GLYCOLAX/MIRALAX) powder Take 17 g by mouth 2 (two) times daily as needed. 3350 g 1  . senna-docusate (SENOKOT-S) 8.6-50 MG tablet Take 1 tablet by mouth at bedtime as needed for mild constipation. 120 tablet 3  . solifenacin (VESICARE) 10 MG tablet Take 1 tablet (10 mg total) by mouth daily. 90 tablet 2  . triamcinolone ointment (KENALOG) 0.5 % Apply 1 application topically 2 (two) times daily. 30 g 0  . valsartan (DIOVAN) 40 MG tablet Take 1 tablet (40 mg total) by mouth daily. 90 tablet 2  . VITAMIN B1-B12 IJ Inject as directed once a week.    Marland Kitchen  dexlansoprazole (DEXILANT) 60 MG capsule Take 60 mg by mouth daily.    . ferrous gluconate (FERGON) 324 MG tablet Take 1 tablet (324 mg total) by mouth 3 (three) times daily with meals. (Patient not taking: Reported on 10/05/2016) 180 tablet 3  . HYDROcodone-acetaminophen (NORCO/VICODIN) 5-325 MG tablet Take 2 tablets by mouth every 4 (four) hours as needed. 16 tablet 0  . senna (SENOKOT) 8.6 MG TABS tablet Take 1 tablet (8.6 mg total) by mouth daily as needed for mild constipation. 120 each 0   No facility-administered medications prior to visit.     ROS Review of Systems  Constitutional: Negative for activity change, appetite change and fatigue.  HENT: Negative for congestion, sinus pressure  and sore throat.   Eyes: Negative for visual disturbance.  Respiratory: Negative for cough, chest tightness, shortness of breath and wheezing.   Cardiovascular: Negative for chest pain and palpitations.  Gastrointestinal: Negative for abdominal distention, abdominal pain and constipation.  Endocrine: Negative for polydipsia.  Genitourinary: Negative for dysuria and frequency.  Musculoskeletal: Positive for arthralgias. Negative for back pain.  Skin: Negative for rash.  Neurological: Negative for tremors, light-headedness and numbness.  Hematological: Does not bruise/bleed easily.  Psychiatric/Behavioral: Negative for agitation and behavioral problems.    Objective:  BP 124/67 (BP Location: Left Arm, Patient Position: Sitting, Cuff Size: Normal)   Pulse 72   Temp 98 F (36.7 C) (Oral)   Resp 18   Ht 5' (1.524 m)   Wt 142 lb (64.4 kg)   SpO2 97%   BMI 27.73 kg/m   BP/Weight 01/22/2017 12/20/2631 09/21/4560  Systolic BP 563 893 734  Diastolic BP 67 79 78  Wt. (Lbs) 142 142.8 -  BMI 27.73 27.89 -      Physical Exam  Constitutional: She is oriented to person, place, and time. She appears well-developed and well-nourished.  Cardiovascular: Normal rate, normal heart sounds and intact distal pulses.   No murmur heard. Pulmonary/Chest: Effort normal and breath sounds normal. She has no wheezes. She has no rales. She exhibits no tenderness.  Abdominal: Soft. Bowel sounds are normal. She exhibits no distension and no mass. There is no tenderness.  Musculoskeletal: Normal range of motion.  Neurological: She is alert and oriented to person, place, and time.     Assessment & Plan:   1. Gastroesophageal reflux disease without esophagitis Controlled Avoid recumbent position up to 2 hours after meals - dexlansoprazole (DEXILANT) 60 MG capsule; Take 1 capsule (60 mg total) by mouth daily.  Dispense: 30 capsule; Refill: 3  2. Age-related osteoporosis without current pathological  fracture Discussed side effects of Fosamax - alendronate (FOSAMAX) 70 MG tablet; Take 1 tablet (70 mg total) by mouth every 7 (seven) days. Take with a full glass of water on an empty stomach.  Dispense: 4 tablet; Refill: 11  3. Vitamin D deficiency Continue calcium and vitamin D replacement   Meds ordered this encounter  Medications  . dexlansoprazole (DEXILANT) 60 MG capsule    Sig: Take 1 capsule (60 mg total) by mouth daily.    Dispense:  30 capsule    Refill:  3  . alendronate (FOSAMAX) 70 MG tablet    Sig: Take 1 tablet (70 mg total) by mouth every 7 (seven) days. Take with a full glass of water on an empty stomach.    Dispense:  4 tablet    Refill:  11    Follow-up: Return in about 3 months (around 04/24/2017) for Follow-up on chronic medical  conditions.    This note has been created with Surveyor, quantity. Any transcriptional errors are unintentional.     Arnoldo Morale MD

## 2017-01-22 NOTE — Patient Instructions (Signed)
Osteoporosis (Osteoporosis) La osteoporosis es el debilitamiento y la prdida de la densidad sea. La osteoporosis hace que los huesos se vuelvan ms quebradizos, frgiles propensos a romperse (fractura). Con el tiempo, la osteoporosis puede hacer que los huesos se vuelvan tan dbiles que se fracturan con una simple cada. Los huesos ms propensos a las fracturas son los huesos [place] y la columna vertebral. CAUSAS La causa exacta se desconoce. [place] desarrollar osteoporosis. Puede correr un mayor riesgo si tiene antecedentes familiares de la enfermedad o desnutricin. [place] puede tener un mayor riesgo si:  Es mujer.  Tiene 50 aos o ms.  Fuma.  No realiza actividad fsica.  Es [place]. SIGNOS Y SNTOMAS La fractura puede ser el primer signo de la enfermedad, en especial si se origina a causa de una cada o lesin que, por lo general, no provocara una fractura de hueso. Otros signos y sntomas incluyen lo siguiente:  Dolor en la parte inferior de la espalda y el cuello.  Postura encorvada.  Prdida de la altura. DIAGNSTICO Para realizar un diagnstico, el mdico:  Psychologist, sport and exercise.  Realizar un examen fsico.  Production manager, como: ? Ardelia Mems prueba de la densidad sea mineral. ? Una absorciometra con rayos X de doble energa. [place] tratamiento para la osteoporosis es fortalecer los [place], a fin de reducir el riesgo de fracturas. El tratamiento incluye lo siguiente:  Cambios de estilo de vida, como: ? Seguir una dieta con alto contenido de calcio. ? Hacer ejercicios de levantamiento de pesas y fortalecimiento muscular. ? Dejar de consumir tabaco. ? Limitar el consumo de bebidas alcohlicas.  Tomar medicamentos para retrasar el proceso de prdida sea o aumentar la densidad sea.  Controlar los niveles de calcio y vitamina D. INSTRUCCIONES PARA EL  CUIDADO EN EL HOGAR  Incluya calcio y vitamina D en su dieta. El calcio es importante para la salud sea, y la vitamina D ayuda al organismo a Astronomer calcio.  Realice ejercicios de levantamiento de pesas y fortalecimiento muscular como se lo haya indicado el mdico.  No consuma ningn producto que contenga tabaco, lo que incluye cigarrillos, tabaco de Higher education careers adviser o Psychologist, sport and exercise. Si necesita ayuda para dejar de fumar, consulte al mdico.  Limite el consumo de bebidas alcohlicas.  Tome los medicamentos solamente como se lo haya indicado el mdico.  Concurra a todas las visitas de control como se lo haya indicado el mdico. Esto es importante.  Tome las precauciones necesarias en su casa para reducir el riesgo de cadas, como: ? Mantener las habitaciones bien iluminadas y libres de obstculos. ? Instalar barandas de seguridad en las escaleras. ? Usar tapetes de caucho en el bao y en otros sectores que a menudo estn mojadas o son resbaladizas.  SOLICITE ATENCIN MDICA DE INMEDIATO SI: Sufre una cada o lesin. Esta informacin no tiene Marine scientist el consejo del mdico. Asegrese de hacerle al mdico cualquier pregunta que tenga. Document Released: 04/15/2005 Document Revised: 10/28/2015 Document Reviewed: 12/14/2013 Elsevier Interactive Patient Education  2017 [employer].

## 2017-01-22 NOTE — Progress Notes (Signed)
Patient is here for Osteoporosis FU  Patient denies pain at this time.  Patient has taken medication today. Patient has eaten today.  Patient request a refill on Dexilant.

## 2017-02-05 ENCOUNTER — Telehealth: Payer: Self-pay | Admitting: Family Medicine

## 2017-02-05 MED ORDER — VALSARTAN 40 MG PO TABS: 40.0000 mg | ORAL_TABLET | Freq: Every day | ORAL | 0 refills | Status: DC

## 2017-02-05 MED ORDER — IBUPROFEN 800 MG PO TABS: 800.0000 mg | ORAL_TABLET | Freq: Three times a day (TID) | ORAL | 0 refills | Status: DC | PRN

## 2017-02-05 NOTE — Telephone Encounter (Signed)
Refilled requested medications. Of note, multiple manufacturers of valsartan have had products recalled. However, not all products have been recalled so to avoid confusion, will refill and if notified by pharmacy, will route to Dr. Jarold Song to change medication.

## 2017-02-05 NOTE — Telephone Encounter (Signed)
Good Afternoon  Patient requesting valsartan (DIOVAN) 40 MG tablet   Ibuprofen  800  [facility] [place]   Thank you

## 2017-03-08 ENCOUNTER — Ambulatory Visit: Payer: Medicare Other | Admitting: Internal Medicine

## 2017-03-08 ENCOUNTER — Telehealth: Payer: Self-pay | Admitting: Family Medicine

## 2017-03-08 MED ORDER — LOSARTAN POTASSIUM 100 MG PO TABS: 100.0000 mg | ORAL_TABLET | Freq: Every day | ORAL | 1 refills | Status: DC

## 2017-03-08 NOTE — Telephone Encounter (Signed)
Prescription for losartan has been sent to the pharmacy

## 2017-03-08 NOTE — Telephone Encounter (Signed)
Pt needs a new BP medication. Please follow up.

## 2017-03-08 NOTE — Telephone Encounter (Signed)
Patient called requesting to speak to nurse, pt states she [facility] stating to not take valsartan (DIOVAN) 40 MG tablet anymore. Pt is concerned and does not know what medication she should be on. Please f/up

## 2017-03-08 NOTE — Telephone Encounter (Signed)
Pt was called and a Vm was left informing pt of new medication for her BP.

## 2017-03-16 ENCOUNTER — Encounter: Payer: Self-pay | Admitting: Internal Medicine

## 2017-03-16 ENCOUNTER — Ambulatory Visit: Payer: Medicare Other | Attending: Internal Medicine | Admitting: Internal Medicine

## 2017-03-16 VITALS — BP 144/73 | HR 86 | Temp 98.3°F | Resp 18 | Ht 59.0 in | Wt 136.0 lb

## 2017-03-16 DIAGNOSIS — Z9889 Other specified postprocedural states: Secondary | ICD-10-CM | POA: Diagnosis not present

## 2017-03-16 DIAGNOSIS — F5101 Primary insomnia: Secondary | ICD-10-CM | POA: Diagnosis not present

## 2017-03-16 DIAGNOSIS — Z23 Encounter for immunization: Secondary | ICD-10-CM | POA: Diagnosis not present

## 2017-03-16 DIAGNOSIS — Z79899 Other long term (current) drug therapy: Secondary | ICD-10-CM | POA: Insufficient documentation

## 2017-03-16 DIAGNOSIS — R51 Headache: Secondary | ICD-10-CM | POA: Diagnosis not present

## 2017-03-16 DIAGNOSIS — Z888 Allergy status to other drugs, medicaments and biological substances status: Secondary | ICD-10-CM | POA: Insufficient documentation

## 2017-03-16 DIAGNOSIS — K219 Gastro-esophageal reflux disease without esophagitis: Secondary | ICD-10-CM | POA: Diagnosis not present

## 2017-03-16 DIAGNOSIS — W11XXXA Fall on and from ladder, initial encounter: Secondary | ICD-10-CM | POA: Insufficient documentation

## 2017-03-16 DIAGNOSIS — R609 Edema, unspecified: Secondary | ICD-10-CM | POA: Diagnosis not present

## 2017-03-16 DIAGNOSIS — E559 Vitamin D deficiency, unspecified: Secondary | ICD-10-CM | POA: Diagnosis not present

## 2017-03-16 DIAGNOSIS — R42 Dizziness and giddiness: Secondary | ICD-10-CM | POA: Insufficient documentation

## 2017-03-16 DIAGNOSIS — Z7984 Long term (current) use of oral hypoglycemic drugs: Secondary | ICD-10-CM | POA: Insufficient documentation

## 2017-03-16 DIAGNOSIS — M810 Age-related osteoporosis without current pathological fracture: Secondary | ICD-10-CM | POA: Diagnosis not present

## 2017-03-16 DIAGNOSIS — Z91013 Allergy to seafood: Secondary | ICD-10-CM | POA: Diagnosis not present

## 2017-03-16 DIAGNOSIS — I10 Essential (primary) hypertension: Secondary | ICD-10-CM | POA: Insufficient documentation

## 2017-03-16 MED ORDER — TRAZODONE HCL 50 MG PO TABS: 25.0000 mg | ORAL_TABLET | Freq: Every evening | ORAL | 3 refills | Status: DC | PRN

## 2017-03-16 MED ORDER — AMLODIPINE BESYLATE 5 MG PO TABS: 2.5000 mg | ORAL_TABLET | Freq: Every day | ORAL | 3 refills | Status: DC

## 2017-03-16 MED ORDER — DICLOFENAC SODIUM 1 % TD GEL: 2.0000 g | Freq: Four times a day (QID) | TRANSDERMAL | 1 refills | Status: DC

## 2017-03-16 NOTE — Progress Notes (Signed)
Patient ID: Jodi [name], female    DOB: 05-15-1946  MRN: [id number]  CC: Osteoporosis   Subjective: Jodi [name] is a 71 y.o. female who presents for f/u visit Her concerns today include:  71 yr old with hx of GERD, osteoporosis, Vit D def, diverticulosis, HTN and pre-DM.    1. Osteoporosis: tolerating Fosamax without GI upset.  Taking Ca+ Vit D OTC but not sure of dose. -needs Vit D level rechecked -Fell off a ladder while cleaning a window in her house. She sustained no major injuries. She was sore for a few days and her arms but pain has resolved  2. HTN: Patches of valsartan will be called recently. She was switched to Cozaar. Took medication for 3 days and stopped because of headache and dizziness -Not been checking blood pressure -Limiting salt in foods -no CP/SOB/LE edema  3. Pre-DM: Tolerating Metformin Trying to be careful with eating habits  4. Problems sleeping x 2 wks -No new life events that she thinks may be playing a role -Usually gets in bed around the same time every night. Denies drinking caffeinated beverages or alcoholic beverages in the evenings -turns off all lights and sounds -She tosses and turns. Can be a few hours before she actually falls asleep. Patient Active Problem List   Diagnosis Date Noted  . Osteoporosis 01/08/2017  . GERD (gastroesophageal reflux disease) 01/08/2017  . Vitamin D deficiency 01/08/2017  . Edema 03/13/2014     Current Outpatient Prescriptions on File Prior to Visit  Medication Sig Dispense Refill  . ACCU-CHEK SOFTCLIX LANCETS lancets Use as instructed to test blood glucose twice daily. 100 each 12  . acyclovir (ZOVIRAX) 800 MG tablet Take 1 tablet (800 mg total) by mouth 4 (four) times daily. 40 tablet 0  . alendronate (FOSAMAX) 70 MG tablet Take 1 tablet (70 mg total) by mouth every 7 (seven) days. Take with a full glass of water on an empty stomach. 4 tablet 11  . Blood Glucose Monitoring Suppl (ACCU-CHEK  AVIVA PLUS) w/Device KIT To test blood glucose twice daily. 1 kit 0  . dexlansoprazole (DEXILANT) 60 MG capsule Take 1 capsule (60 mg total) by mouth daily. 30 capsule 3  . Elastic Bandages & Supports (LUMBAR BACK BRACE/SUPPORT PAD) MISC 1 application by Does not apply route daily. 1 each 0  . glucose blood (ACCU-CHEK AVIVA PLUS) test strip Use as instructed to test blood glucose 2 times daily. 100 each 12  . ibuprofen (ADVIL,MOTRIN) 800 MG tablet Take 1 tablet (800 mg total) by mouth every 8 (eight) hours as needed. Take with food., do not take on empty stomach 30 tablet 0  . Iron-FA-B Cmp-C-Biot-Probiotic (FUSION PLUS) CAPS Take 1 tablet by mouth every other day. As tolerated 90 capsule 2  . Lancet Devices (ACCU-CHEK SOFTCLIX) lancets Use as instructed to test blood glucose twice daily. 1 each 0  . metFORMIN (GLUCOPHAGE) 500 MG tablet Take 1 tablet (500 mg total) by mouth daily with breakfast. 90 tablet 3  . polyethylene glycol powder (GLYCOLAX/MIRALAX) powder Take 17 g by mouth 2 (two) times daily as needed. 3350 g 1  . senna-docusate (SENOKOT-S) 8.6-50 MG tablet Take 1 tablet by mouth at bedtime as needed for mild constipation. 120 tablet 3  . solifenacin (VESICARE) 10 MG tablet Take 1 tablet (10 mg total) by mouth daily. 90 tablet 2  . triamcinolone ointment (KENALOG) 0.5 % Apply 1 application topically 2 (two) times daily. 30 g 0  . VITAMIN  B1-B12 IJ Inject as directed once a week.     No current facility-administered medications on file prior to visit.     Allergies  Allergen Reactions  . Cortisone     Rash, swelling  . Gabapentin Nausea Only    With dizziness  . Pork-Derived Products     Rash, joint pain  . Shrimp [Shellfish Allergy]     Rash, joint pain, GI pain    Social History   Social History  . Marital status: Single    Spouse name: N/A  . Number of children: N/A  . Years of education: N/A   Occupational History  . Not on file.   Social History Main Topics  .  Smoking status: Never Smoker  . Smokeless tobacco: Never Used  . Alcohol use No  . Drug use: No  . Sexual activity: Not on file   Other Topics Concern  . Not on file   Social History Narrative  . No narrative on file    History reviewed. No pertinent family history.  Past Surgical History:  Procedure Laterality Date  . CESAREAN SECTION    . SHOULDER SURGERY Left 2010    ROS: Review of Systems Negative except as stated above PHYSICAL EXAM: BP (!) 144/73 (BP Location: Right Arm, Patient Position: Sitting, Cuff Size: Normal)   Pulse 86   Temp 98.3 F (36.8 C) (Oral)   Resp 18   Ht 4' 11"  (1.499 m)   Wt 136 lb (61.7 kg)   SpO2 97%   BMI 27.47 kg/m   Physical Exam General appearance - alert, well appearing, pleasant elderly female and in no distress Mental status - alert, oriented to person, place, and time, normal mood, behavior, speech, dress, motor activity, and thought processes Mouth - mucous membranes moist, pharynx normal without lesions Neck - supple, no significant adenopathy Chest - clear to auscultation, no wheezes, rales or rhonchi, symmetric air entry Heart - normal rate, regular rhythm, normal S1, S2, no murmurs, rubs, clicks or gallops Extremities - peripheral pulses normal, no pedal edema, no clubbing or cyanosis  ASSESSMENT AND PLAN: 1. Age-related osteoporosis without current pathological fracture -Continue Fosamax. Recheck vitamin D level. 2. Vitamin D deficiency - VITAMIN D 25 Hydroxy (Vit-D Deficiency, Fractures)  3. HTN (hypertension), benign Continue low-salt diet. Stop Cozaar. Start low-dose Norvasc - amLODipine (NORVASC) 5 MG tablet; Take 0.5 tablets (2.5 mg total) by mouth daily.  Dispense: 45 tablet; Refill: Keep follow-up appointment with PCP for repeat blood pressure check  4. Primary insomnia -Good sleep hygiene discussed. Printed information in Spanish given to patient. Trial of low-dose trazodone. - traZODone (DESYREL) 50 MG  tablet; Take 0.5 tablets (25 mg total) by mouth at bedtime as needed for sleep.  Dispense: 30 tablet; Refill: 3  5. Need for influenza vaccination - Flu Vaccine QUAD 6+ mos PF IM (Fluarix Quad PF)  Patient was given the opportunity to ask questions.  Patient verbalized understanding of the plan and was able to repeat key elements of the plan.  [employer] interpreter used during this encounter.  Orders Placed This Encounter  Procedures  . Flu Vaccine QUAD 6+ mos PF IM (Fluarix Quad PF)  . VITAMIN D 25 Hydroxy (Vit-D Deficiency, Fractures)     Requested Prescriptions   Signed Prescriptions Disp Refills  . amLODipine (NORVASC) 5 MG tablet 45 tablet 3    Sig: Take 0.5 tablets (2.5 mg total) by mouth daily.  . traZODone (DESYREL) 50 MG tablet 30 tablet 3  Sig: Take 0.5 tablets (25 mg total) by mouth at bedtime as needed for sleep.  Marland Kitchen diclofenac sodium (VOLTAREN) 1 % GEL 100 g 1    Sig: Apply 2 g topically 4 (four) times daily.    No Follow-up on file.  Karle Plumber, MD, FACP

## 2017-03-16 NOTE — Progress Notes (Signed)
Patient reports Losartan makes her dizzy.

## 2017-03-16 NOTE — Patient Instructions (Signed)
Insomnio  (Insomnia)  El insomnio es un trastorno del sueo que causa dificultades para conciliar el sueo o para mantenerlo. Puede producir cansancio (fatiga), falta de energa, dificultad para concentrarse, cambios en el estado de nimo y mal rendimiento escolar o laboral.  Hay tres formas diferentes de clasificar el insomnio:   Dificultad para conciliar el sueo.   Dificultad para mantener el sueo.   Despertar muy precoz por la maana.  Cualquier tipo de insomnio puede ser a largo plazo (crnico) o a corto plazo (agudo). Ambos son frecuentes. Generalmente, el insomnio a corto plazo dura tres meses o menos tiempo. El crnico ocurre al menos tres veces por semana durante ms de tres meses.  CAUSAS  El insomnio puede deberse a otra afeccin, situacin o sustancia, por ejemplo:   Ansiedad.   Algunos medicamentos.   Enfermedad por reflujo gastroesofgico (ERGE) u otras enfermedades gastrointestinales.   Asma y otras enfermedades respiratorias.   Sndrome de las piernas inquietas, apnea del sueo u otros trastornos del sueo.   Dolor crnico.   Menopausia, que puede incluir calores repentinos.   Ictus.   Consumo excesivo de alcohol, tabaco u drogas ilegales.   Depresin.   Cafena.   Trastornos neurolgicos, como enfermedad de Alzheimer.   Hiperactividad tiroidea (hipertiroidismo).  Es posible que la causa del insomnio no se conozca.  FACTORES DE RIESGO  Los factores de riesgo de tener insomnio incluyen lo siguiente:   El sexo. La mujeres se ven ms afectadas que los hombres.   La edad. El insomnio es ms frecuente a medida que una persona envejece.   El estrs. Esto puede incluir su vida profesional o personal.   Los ingresos. El insomnio es ms frecuente en las personas cuyos ingresos son ms bajos.   La falta de actividad fsica.   Los horarios de trabajo irregulares o los turnos nocturnos.   Los viajes a lugares de diferentes zonas horarias.  SIGNOS Y SNTOMAS  Si tiene insomnio, el sntoma  principal es la dificultad para conciliar el sueo o mantenerlo. Esto puede derivar en otros sntomas, por ejemplo:   Sentirse fatigado.   Ponerse nervioso por tener que irse a dormir.   No sentirse descansado por la maana.   Tener dificultad para concentrarse.   Sentirse irritable, ansioso o deprimido.  TRATAMIENTO  El tratamiento para el insomnio depende de la causa. Si se debe a una enfermedad preexistente, el tratamiento se centrar en el abordaje de la enfermedad. El tratamiento tambin puede incluir lo siguiente:   Medicamentos que lo ayuden a dormir.   Asesoramiento psicolgico o terapia.   Cambios en el estilo de vida.  INSTRUCCIONES PARA EL CUIDADO EN EL HOGAR   Tome los medicamentos solamente como se lo haya indicado el mdico.   Establezca horarios habituales para dormir y despertarse. No tome siestas.   Lleve un registro del sueo ya que podra ser de utilidad para que usted y a su mdico puedan determinar qu podra estar causndole insomnio. Incluya lo siguiente:  ? Cundo duerme.  ? Cundo se despierta durante la noche.  ? Qu tan bien duerme.  ? Qu tan relajado se siente al da siguiente.  ? Cualquier efecto secundario de los medicamentos que toma.  ? Lo que usted come y bebe.   Convierta a su habitacin en un lugar cmodo donde sea fcil conciliar el sueo:  ? Coloque persianas o cortinas especiales oscuras que impidan la entrada de la luz del exterior.  ? Para bloquear los ruidos, use   un aparato que reproduce sonidos ambientales o relajantes de fondo.  ? Mantenga baja la temperatura.   Haga ejercicio regularmente como se lo haya indicado el mdico. No haga ejercicio justo antes de la hora de acostarse.   Utilice tcnicas de relajacin para controlar el estrs. Pdale al mdico que le sugiera algunas tcnicas que sean adecuadas para usted. Estos pueden incluir lo siguiente:  ? Ejercicios de respiracin.  ? Rutinas para aliviar la tensin muscular.  ? Visualizacin de escenas  apacibles.   Disminucin del consumo de alcohol, bebidas con cafena y cigarrillos, especialmente cerca de la hora de acostarse, ya que pueden perturbarle el sueo.   No coma en exceso ni consuma comidas picantes justo antes de la hora de acostarse. Esto puede causarle molestias digestivas y dificultades para dormir.   Limite el uso de pantallas antes de la hora de acostarse. Esto incluye lo siguiente:  ? Mirar televisin.  ? Usar el telfono inteligente, la tableta y la computadora.   Siga una rutina. Esto puede ayudarlo a conciliar el sueo ms rpidamente. Intente hacer una actividad tranquila, cepillarse los dientes e irse a la cama a la misma hora todas las noches.   Levntese de la cama si sigue despierto despus de 15minutos de haber intentado dormirse. Mantenga bajas las luces, pero intente leer o hacer una actividad tranquila. Cuando tenga sueo, regrese a la cama.   Conduzca con cuidado. No conduzca si est muy somnoliento.   Concurra a todas las visitas de control, segn le indique su mdico. Esto es importante.  SOLICITE ATENCIN MDICA SI:   Est cansado durante el da o tiene dificultades en su rutina diaria debido a la somnolencia.   Sigue teniendo problemas para dormir o estos empeoran.  SOLICITE ATENCIN MDICA DE INMEDIATO SI:   Tiene pensamientos serios acerca de lastimarse a usted mismo o daar a otra persona.  Esta informacin no tiene como fin reemplazar el consejo del mdico. Asegrese de hacerle al mdico cualquier pregunta que tenga.  Document Released: 07/06/2005 Document Revised: 07/27/2014 Document Reviewed: 04/06/2014  Elsevier Interactive Patient Education  2018 [employer]vier Inc[employer]

## 2017-03-17 LAB — VITAMIN D 25 HYDROXY (VIT D DEFICIENCY, FRACTURES): VIT D 25 HYDROXY: 63.8 ng/mL (ref 30.0–100.0)

## 2017-04-19 ENCOUNTER — Other Ambulatory Visit: Payer: Self-pay | Admitting: Pharmacist

## 2017-04-19 DIAGNOSIS — K219 Gastro-esophageal reflux disease without esophagitis: Secondary | ICD-10-CM

## 2017-04-19 MED ORDER — DEXLANSOPRAZOLE 60 MG PO CPDR: 60.0000 mg | DELAYED_RELEASE_CAPSULE | Freq: Every day | ORAL | 0 refills | Status: DC

## 2017-05-03 ENCOUNTER — Other Ambulatory Visit: Payer: Self-pay | Admitting: Family Medicine

## 2017-05-05 ENCOUNTER — Telehealth: Payer: Self-pay | Admitting: Internal Medicine

## 2017-05-05 DIAGNOSIS — I10 Essential (primary) hypertension: Secondary | ICD-10-CM

## 2017-05-05 MED ORDER — AMLODIPINE BESYLATE 5 MG PO TABS: 2.5000 mg | ORAL_TABLET | Freq: Every day | ORAL | 0 refills | Status: DC

## 2017-05-05 NOTE — Telephone Encounter (Signed)
Patient called requesting medication refill on amLODipine (NORVASC) 5 MG tablet, pt states she is completely out of medication . [facility] [address].

## 2017-05-05 NOTE — Telephone Encounter (Signed)
Requested medication refilled

## 2017-05-16 ENCOUNTER — Other Ambulatory Visit: Payer: Self-pay | Admitting: Family Medicine

## 2017-05-16 DIAGNOSIS — K219 Gastro-esophageal reflux disease without esophagitis: Secondary | ICD-10-CM

## 2017-06-16 ENCOUNTER — Ambulatory Visit: Payer: Medicare Other

## 2017-07-01 ENCOUNTER — Other Ambulatory Visit: Payer: Self-pay | Admitting: Pharmacist

## 2017-07-01 MED ORDER — SOLIFENACIN SUCCINATE 10 MG PO TABS: 10.0000 mg | ORAL_TABLET | Freq: Every day | ORAL | 0 refills | Status: DC

## 2017-07-14 ENCOUNTER — Telehealth: Payer: Self-pay | Admitting: Internal Medicine

## 2017-07-14 NOTE — Telephone Encounter (Signed)
Per last prescription by Dr. Wynetta Emery, patient needs office visit for refills. It looks like she no-showed a walk in appt in November. Will need to be approved by PCP. Will forward to Dr. Wynetta Emery.

## 2017-07-14 NOTE — Telephone Encounter (Signed)
Pt called to request a refill on  -diclofenac sodium (VOLTAREN) 1 % GEL  Please follow up

## 2017-07-15 ENCOUNTER — Ambulatory Visit (HOSPITAL_COMMUNITY)
Admission: EM | Admit: 2017-07-15 | Discharge: 2017-07-15 | Disposition: A | Discharge status: Home / Self Care | Payer: Medicare Other | Attending: Emergency Medicine | Admitting: Emergency Medicine

## 2017-07-15 ENCOUNTER — Encounter (HOSPITAL_COMMUNITY): Payer: Self-pay | Admitting: Family Medicine

## 2017-07-15 ENCOUNTER — Ambulatory Visit (INDEPENDENT_AMBULATORY_CARE_PROVIDER_SITE_OTHER): Payer: Medicare Other

## 2017-07-15 DIAGNOSIS — S63621A Sprain of interphalangeal joint of right thumb, initial encounter: Secondary | ICD-10-CM

## 2017-07-15 DIAGNOSIS — S62642A Nondisplaced fracture of proximal phalanx of right middle finger, initial encounter for closed fracture: Secondary | ICD-10-CM

## 2017-07-15 DIAGNOSIS — S62515A Nondisplaced fracture of proximal phalanx of left thumb, initial encounter for closed fracture: Secondary | ICD-10-CM

## 2017-07-15 DIAGNOSIS — M79641 Pain in right hand: Secondary | ICD-10-CM | POA: Diagnosis not present

## 2017-07-15 DIAGNOSIS — W19XXXA Unspecified fall, initial encounter: Secondary | ICD-10-CM

## 2017-07-15 DIAGNOSIS — S63642A Sprain of metacarpophalangeal joint of left thumb, initial encounter: Secondary | ICD-10-CM | POA: Diagnosis not present

## 2017-07-15 DIAGNOSIS — W000XXA Fall on same level due to ice and snow, initial encounter: Secondary | ICD-10-CM | POA: Diagnosis not present

## 2017-07-15 DIAGNOSIS — S6991XA Unspecified injury of right wrist, hand and finger(s), initial encounter: Secondary | ICD-10-CM | POA: Diagnosis not present

## 2017-07-15 MED ORDER — DICLOFENAC SODIUM 1 % TD GEL: 2.0000 g | Freq: Four times a day (QID) | TRANSDERMAL | 1 refills | Status: DC

## 2017-07-15 NOTE — ED Provider Notes (Signed)
[facility]    CSN: [id number] Arrival date & time: 07/15/17  1136     History   Chief Complaint Chief Complaint  Patient presents with  . Fall    HPI Jodi [name] is a 71 y.o. female.   71 year old well-preserved female was walking in a parking lot, slipped on some ice and fell on outstretched hands. Complaining of bilateral thumb pain. Right thumb with pain primarily to the IP joint and proximal phalanx. She demonstrates flexion which locks her thumb in a flexed position and produces a deformity. She is unable to force the thumb in extension with a popping sound. Left thumb with tenderness primarily at the thenar eminence and first metacarpal. No swelling or deformity. Denies injury to the hands or other digits. No wrist pain. No other expressed injuries.      Past Medical History:  Diagnosis Date  . Anemia   . Diabetes mellitus without complication ([place])   . Hypertension   . Insomnia     Patient Active Problem List   Diagnosis Date Noted  . HTN (hypertension), benign 03/16/2017  . Primary insomnia 03/16/2017  . Osteoporosis 01/08/2017  . GERD (gastroesophageal reflux disease) 01/08/2017  . Vitamin D deficiency 01/08/2017  . Edema 03/13/2014    Past Surgical History:  Procedure Laterality Date  . CESAREAN SECTION    . SHOULDER SURGERY Left 2010    OB History    No data available       Home Medications    Prior to Admission medications   Medication Sig Start Date End Date Taking? Authorizing Provider  ACCU-CHEK SOFTCLIX LANCETS lancets Use as instructed to test blood glucose twice daily. 07/15/16   Maren Reamer, MD  acyclovir (ZOVIRAX) 800 MG tablet Take 1 tablet (800 mg total) by mouth 4 (four) times daily. 11/20/16   Fransico Meadow, PA-C  alendronate (FOSAMAX) 70 MG tablet Take 1 tablet (70 mg total) by mouth every 7 (seven) days. Take with a full glass of water on an empty stomach. 01/22/17   Arnoldo Morale, MD  amLODipine  (NORVASC) 5 MG tablet Take 0.5 tablets (2.5 mg total) by mouth daily. 05/05/17   Arnoldo Morale, MD  Blood Glucose Monitoring Suppl (ACCU-CHEK AVIVA PLUS) w/Device KIT To test blood glucose twice daily. 07/15/16   Maren Reamer, MD  DEXILANT 60 MG capsule TOME UNA CAPSULA TODOS LOS DIAS 05/17/17   Ladell Pier, MD  diclofenac sodium (VOLTAREN) 1 % GEL Apply 2 g topically 4 (four) times daily. 07/15/17   Ladell Pier, MD  Elastic Bandages & Supports (LUMBAR BACK BRACE/SUPPORT PAD) MISC 1 application by Does not apply route daily. 04/02/16   Langeland, Dawn T, MD  glucose blood (ACCU-CHEK AVIVA PLUS) test strip Use as instructed to test blood glucose 2 times daily. 07/15/16   Maren Reamer, MD  ibuprofen (ADVIL,MOTRIN) 800 MG tablet Take 1 tablet (800 mg total) by mouth every 8 (eight) hours as needed. Take with food., do not take on empty stomach 02/05/17   Arnoldo Morale, MD  Iron-FA-B Cmp-C-Biot-Probiotic (FUSION PLUS) CAPS Take 1 tablet by mouth every other day. As tolerated 10/10/16   Langeland, Dawn T, MD  Lancet Devices [facility]) lancets Use as instructed to test blood glucose twice daily. 07/15/16   Maren Reamer, MD  metFORMIN (GLUCOPHAGE) 500 MG tablet Take 1 tablet (500 mg total) by mouth daily with breakfast. 10/07/16   Maren Reamer, MD  polyethylene glycol powder (GLYCOLAX/MIRALAX)  powder Take 17 g by mouth 2 (two) times daily as needed. 10/07/16   Langeland, Dawn T, MD  senna-docusate (SENOKOT-S) 8.6-50 MG tablet Take 1 tablet by mouth at bedtime as needed for mild constipation. 10/07/16   Maren Reamer, MD  solifenacin (VESICARE) 10 MG tablet Take 1 tablet (10 mg total) by mouth daily. 07/01/17   Ladell Pier, MD  traZODone (DESYREL) 50 MG tablet Take 0.5 tablets (25 mg total) by mouth at bedtime as needed for sleep. 03/16/17   Ladell Pier, MD  triamcinolone ointment (KENALOG) 0.5 % Apply 1 application topically 2 (two) times daily. 11/24/16    Maren Reamer, MD  VITAMIN B1-B12 IJ Inject as directed once a week.    [provider]    Family History History reviewed. No pertinent family history.  Social History Social History   Tobacco Use  . Smoking status: Never Smoker  . Smokeless tobacco: Never Used  Substance Use Topics  . Alcohol use: No  . Drug use: No     Allergies   Cortisone; Gabapentin; Pork-derived products; and Shrimp [shellfish allergy]   Review of Systems Review of Systems  Constitutional: Negative for activity change, chills and fever.  HENT: Negative.   Respiratory: Negative.   Cardiovascular: Negative.   Musculoskeletal:       As per HPI  Skin: Negative for color change, pallor and rash.  Neurological: Negative.   All other systems reviewed and are negative.    Physical Exam Triage Vital Signs ED Triage Vitals [07/15/17 1219]  Enc Vitals Group     BP (!) 145/77     Pulse Rate 77     Resp 18     Temp 98.6 F (37 C)     Temp src      SpO2 100 %     Weight      Height      Head Circumference      Peak Flow      Pain Score      Pain Loc      Pain Edu?      Excl. in [place]?    No data found.  Updated Vital Signs BP (!) 145/77   Pulse 77   Temp 98.6 F (37 C)   Resp 18   SpO2 100%   Visual Acuity Right Eye Distance:   Left Eye Distance:   Bilateral Distance:    Right Eye Near:   Left Eye Near:    Bilateral Near:     Physical Exam  Constitutional: She is oriented to person, place, and time. She appears well-developed and well-nourished. No distress.  HENT:  Head: Normocephalic and atraumatic.  Eyes: EOM are normal.  Neck: Normal range of motion. Neck supple.  Pulmonary/Chest: Effort normal.  Musculoskeletal: She exhibits tenderness. She exhibits no edema.  Right thumb with light bruising over the DIP joint. Minor tenderness over the proximal phalanx and tenderness over the DIP joint. Patient is able to flex however this produces a rapid clicking movement  and sound which locks the thumb in it flexion and produces some lateral deformity. Forced extension tends to straighten her thumb and allow it to maintain extension. Distal neurovascular motor sensory grossly intact. No other bony tenderness or swelling.  Left thumb with tenderness at the thenar eminence and lesser to the first metacarpal. No swelling or discoloration. Full range of motion of the left thumb. Full opposition. Distal neurovascular motor sensory is grossly intact. Primarily minor tenderness  with normal function.  Neurological: She is alert and oriented to person, place, and time. No cranial nerve deficit.  Skin: Skin is warm and dry.  Psychiatric: She has a normal mood and affect.     UC Treatments / Results  Labs (all labs ordered are listed, but only abnormal results are displayed) Labs Reviewed - No data to display  EKG  EKG Interpretation None       Radiology Dg Hand Complete Right  Result Date: 07/15/2017 CLINICAL DATA:  71 year old female with a history of fall and pain EXAM: RIGHT HAND - COMPLETE 3+ VIEW COMPARISON:  None. FINDINGS: Questionable nondisplaced fracture at the head of the first phalanx, first digit right hand. No evidence of joint subluxation/ dislocation. Soft tissue swelling at the thumb. Degenerative changes of the interphalangeal joints of the hand. No significant osteopenia. No erosive changes. Carpal bones unremarkable. IMPRESSION: Questionable nondisplaced fracture at the head of the proximal phalanx of the first digit right hand. Degenerative changes. Electronically Signed   By: Corrie Mckusick D.O.   On: 07/15/2017 14:13    Procedures Procedures (including critical care time)  Medications Ordered in UC Medications - No data to display   Initial Impression / Assessment and Plan / UC Course  I have reviewed the triage vital signs and the nursing notes.  Pertinent labs & imaging results that were available during my care of the patient were  reviewed by me and considered in my medical decision making (see chart for details).    Keep splint on the right thumb. Apply ice to both thumbs for the first couple days. Follow-up with Dr. Fredna Dow for your right thumb injury.    Final Clinical Impressions(s) / UC Diagnoses   Final diagnoses:  Fall, initial encounter  Sprain of interphalangeal joint of right thumb, initial encounter  Nondisplaced fracture of proximal phalanx of left thumb, initial encounter for closed fracture  Sprain of metacarpophalangeal (MCP) joint of left thumb, initial encounter    ED Discharge Orders    None       Controlled Substance Prescriptions [place] Controlled Substance Registry consulted? Not Applicable   Janne Napoleon, NP 07/15/17 2234

## 2017-07-15 NOTE — Addendum Note (Signed)
Addended by: Karle Plumber B on: 07/15/2017 08:35 AM   Modules accepted: Orders

## 2017-07-15 NOTE — Discharge Instructions (Signed)
Keep splint on the right thumb. Apply ice to both thumbs for the first couple days. Follow-up with Dr. Fredna Dow for your right thumb injury.

## 2017-07-15 NOTE — Telephone Encounter (Signed)
RF sent on Voltaren gel.

## 2017-07-15 NOTE — ED Triage Notes (Signed)
Pt here for bilateral hand pain after a fall yesterday morning. sts that she slipped on some ice. Pain is specifically in bilateral thumbs.

## 2017-07-26 DIAGNOSIS — M65311 Trigger thumb, right thumb: Secondary | ICD-10-CM | POA: Diagnosis not present

## 2017-08-09 ENCOUNTER — Telehealth: Payer: Self-pay | Admitting: Internal Medicine

## 2017-08-09 NOTE — Telephone Encounter (Signed)
Pt called to request a refill for  -ibuprofen (ADVIL,MOTRIN) 800 MG tablet  -diclofenac sodium (VOLTAREN) 1 % GEL  If approved please send to  [id number] [address] Please follow up

## 2017-08-09 NOTE — Telephone Encounter (Signed)
I am not sure that patient needs both. Will forward to PCP.

## 2017-08-10 MED ORDER — DICLOFENAC SODIUM 1 % TD GEL: 2.0000 g | Freq: Four times a day (QID) | TRANSDERMAL | 3 refills | Status: DC

## 2017-08-10 NOTE — Telephone Encounter (Signed)
Will RF Voltaren gel. Pt on Fosamax and has GERD.

## 2017-08-10 NOTE — Addendum Note (Signed)
Addended by: Karle Plumber B on: 08/10/2017 08:13 AM   Modules accepted: Orders

## 2017-08-12 ENCOUNTER — Other Ambulatory Visit: Payer: Self-pay | Admitting: Pharmacist

## 2017-08-12 MED ORDER — SOLIFENACIN SUCCINATE 10 MG PO TABS: 10.0000 mg | ORAL_TABLET | Freq: Every day | ORAL | 0 refills | Status: DC

## 2017-08-13 ENCOUNTER — Telehealth: Payer: Self-pay | Admitting: Internal Medicine

## 2017-08-13 NOTE — Telephone Encounter (Signed)
Pt. Called requesting a refill on ibuprofen (ADVIL,MOTRIN) 800 MG tablet Pt. [facility] [address]. Please f/u with pt.

## 2017-08-13 NOTE — Telephone Encounter (Signed)
Last seen August 2018 by PCP, ibuprofen prescribed by another physician, will forward to PCP for approval.

## 2017-08-23 DIAGNOSIS — M65311 Trigger thumb, right thumb: Secondary | ICD-10-CM | POA: Diagnosis not present

## 2017-08-30 ENCOUNTER — Encounter: Payer: Medicare Other | Admitting: Internal Medicine

## 2017-09-07 ENCOUNTER — Other Ambulatory Visit: Payer: Self-pay | Admitting: Family Medicine

## 2017-09-07 DIAGNOSIS — I10 Essential (primary) hypertension: Secondary | ICD-10-CM

## 2017-09-28 ENCOUNTER — Ambulatory Visit: Payer: Medicare Other | Attending: Internal Medicine | Admitting: Internal Medicine

## 2017-09-28 ENCOUNTER — Encounter: Payer: Self-pay | Admitting: Internal Medicine

## 2017-09-28 VITALS — BP 135/79 | HR 88 | Temp 98.2°F | Resp 16 | Ht 60.0 in | Wt 143.2 lb

## 2017-09-28 DIAGNOSIS — R7303 Prediabetes: Secondary | ICD-10-CM

## 2017-09-28 DIAGNOSIS — I10 Essential (primary) hypertension: Secondary | ICD-10-CM

## 2017-09-28 DIAGNOSIS — Z91018 Allergy to other foods: Secondary | ICD-10-CM | POA: Diagnosis not present

## 2017-09-28 DIAGNOSIS — K219 Gastro-esophageal reflux disease without esophagitis: Secondary | ICD-10-CM | POA: Diagnosis not present

## 2017-09-28 DIAGNOSIS — K5790 Diverticulosis of intestine, part unspecified, without perforation or abscess without bleeding: Secondary | ICD-10-CM | POA: Diagnosis not present

## 2017-09-28 DIAGNOSIS — E559 Vitamin D deficiency, unspecified: Secondary | ICD-10-CM

## 2017-09-28 DIAGNOSIS — E119 Type 2 diabetes mellitus without complications: Secondary | ICD-10-CM | POA: Insufficient documentation

## 2017-09-28 DIAGNOSIS — Z23 Encounter for immunization: Secondary | ICD-10-CM | POA: Diagnosis not present

## 2017-09-28 DIAGNOSIS — Z888 Allergy status to other drugs, medicaments and biological substances status: Secondary | ICD-10-CM | POA: Insufficient documentation

## 2017-09-28 DIAGNOSIS — E538 Deficiency of other specified B group vitamins: Secondary | ICD-10-CM

## 2017-09-28 DIAGNOSIS — Z91013 Allergy to seafood: Secondary | ICD-10-CM | POA: Diagnosis not present

## 2017-09-28 DIAGNOSIS — M810 Age-related osteoporosis without current pathological fracture: Secondary | ICD-10-CM

## 2017-09-28 DIAGNOSIS — Z79899 Other long term (current) drug therapy: Secondary | ICD-10-CM | POA: Insufficient documentation

## 2017-09-28 DIAGNOSIS — Z7984 Long term (current) use of oral hypoglycemic drugs: Secondary | ICD-10-CM | POA: Insufficient documentation

## 2017-09-28 LAB — POCT GLYCOSYLATED HEMOGLOBIN (HGB A1C): HEMOGLOBIN A1C: 5.5

## 2017-09-28 LAB — GLUCOSE, POCT (MANUAL RESULT ENTRY): POC Glucose: 115 mg/dl — AB (ref 70–99)

## 2017-09-28 NOTE — Progress Notes (Signed)
Patient is for a follow-up on diabetes.   Patient stated she never got her Vitamin B12 Rx, been feeling tired, no appetite.

## 2017-09-28 NOTE — Progress Notes (Signed)
Patient ID: ADRIE [name], female    DOB: Nov 28, 1945  MRN: [id number]  CC: Follow-up   Subjective: Jodi [name] is a 72 y.o. female who presents for chronic ds management Her concerns today include:  72 yr old with hx of GERD, osteoporosis, Vit D def, diverticulosis, HTN and pre-DM.   1.  Osteoporosis:  Compliant with Fosamax and tolerating without GI upset.  2.  C/o pain in bones all over and decrease appetite. Thinks may be due to B12 def.  She sees med on med list but never had the med.  Would like to have levels checked. Denies any pain in the joints or swelling in the joints Still taking Calcium and ? Vit D - 600 mg once a day  3.  Pre-DM:  Tolerating Metformin.  Does okay with eating habits.  Tries to walk for exercise  4.  HTN: Compliant with amlodipine.  Denies any chest pains or shortness of breath. Patient Active Problem List   Diagnosis Date Noted  . Diabetes mellitus without complication ([place]) 14/48/1856  . HTN (hypertension), benign 03/16/2017  . Primary insomnia 03/16/2017  . Osteoporosis 01/08/2017  . GERD (gastroesophageal reflux disease) 01/08/2017  . Vitamin D deficiency 01/08/2017  . Edema 03/13/2014     Current Outpatient Medications on File Prior to Visit  Medication Sig Dispense Refill  . ACCU-CHEK SOFTCLIX LANCETS lancets Use as instructed to test blood glucose twice daily. 100 each 12  . alendronate (FOSAMAX) 70 MG tablet Take 1 tablet (70 mg total) by mouth every 7 (seven) days. Take with a full glass of water on an empty stomach. 4 tablet 11  . amLODipine (NORVASC) 5 MG tablet TAKE 0.5 TABLETS (2.5 MG TOTAL) BY MOUTH DAILY. 15 tablet 0  . Blood Glucose Monitoring Suppl (ACCU-CHEK AVIVA PLUS) w/Device KIT To test blood glucose twice daily. 1 kit 0  . DEXILANT 60 MG capsule TOME UNA CAPSULA TODOS LOS DIAS 30 capsule 0  . diclofenac sodium (VOLTAREN) 1 % GEL Apply 2 g topically 4 (four) times daily. 100 g 3  . Elastic Bandages & Supports  (LUMBAR BACK BRACE/SUPPORT PAD) MISC 1 application by Does not apply route daily. 1 each 0  . glucose blood (ACCU-CHEK AVIVA PLUS) test strip Use as instructed to test blood glucose 2 times daily. 100 each 12  . Lancet Devices (ACCU-CHEK SOFTCLIX) lancets Use as instructed to test blood glucose twice daily. 1 each 0  . metFORMIN (GLUCOPHAGE) 500 MG tablet Take 1 tablet (500 mg total) by mouth daily with breakfast. 90 tablet 3  . polyethylene glycol powder (GLYCOLAX/MIRALAX) powder Take 17 g by mouth 2 (two) times daily as needed. 3350 g 1  . senna-docusate (SENOKOT-S) 8.6-50 MG tablet Take 1 tablet by mouth at bedtime as needed for mild constipation. 120 tablet 3  . solifenacin (VESICARE) 10 MG tablet Take 1 tablet (10 mg total) by mouth daily. 30 tablet 0  . traZODone (DESYREL) 50 MG tablet Take 0.5 tablets (25 mg total) by mouth at bedtime as needed for sleep. 30 tablet 3  . Iron-FA-B Cmp-C-Biot-Probiotic (FUSION PLUS) CAPS Take 1 tablet by mouth every other day. As tolerated 90 capsule 2  . VITAMIN B1-B12 IJ Inject as directed once a week.     No current facility-administered medications on file prior to visit.     Allergies  Allergen Reactions  . Cortisone     Rash, swelling  . Gabapentin Nausea Only    With dizziness  .  Pork-Derived Products     Rash, joint pain  . Shrimp [Shellfish Allergy]     Rash, joint pain, GI pain    Social History   Socioeconomic History  . Marital status: Single    Spouse name: Not on file  . Number of children: Not on file  . Years of education: Not on file  . Highest education level: Not on file  Social Needs  . Financial resource strain: Not on file  . Food insecurity - worry: Not on file  . Food insecurity - inability: Not on file  . Transportation needs - medical: Not on file  . Transportation needs - non-medical: Not on file  Occupational History  . Not on file  Tobacco Use  . Smoking status: Never Smoker  . Smokeless tobacco: Never  Used  Substance and Sexual Activity  . Alcohol use: No  . Drug use: No  . Sexual activity: Not on file  Other Topics Concern  . Not on file  Social History Narrative  . Not on file    History reviewed. No pertinent family history.  Past Surgical History:  Procedure Laterality Date  . CESAREAN SECTION    . SHOULDER SURGERY Left 2010    ROS: Review of Systems Negative except as stated above   PHYSICAL EXAM: BP 135/79 (BP Location: Left Arm, Patient Position: Sitting, Cuff Size: Normal)   Pulse 88   Temp 98.2 F (36.8 C) (Oral)   Resp 16   Ht 5' (1.524 m)   Wt 143 lb 3.2 oz (65 kg)   SpO2 96%   BMI 27.97 kg/m   Wt Readings from Last 3 Encounters:  09/28/17 143 lb 3.2 oz (65 kg)  03/16/17 136 lb (61.7 kg)  01/22/17 142 lb (64.4 kg)    Physical Exam  General appearance - alert, well appearing, and in no distress Mental status - alert, oriented to person, place, and time, normal mood, behavior, speech, dress, motor activity, and thought processes Eyes -pale conjunctiva. Neck - supple, no significant adenopathy Chest - clear to auscultation, no wheezes, rales or rhonchi, symmetric air entry Heart - normal rate, regular rhythm, normal S1, S2, no murmurs, rubs, clicks or gallops Musculoskeletal -no signs of active arthritis in the hands, wrists or knees. Extremities -no lower extremity edema  Results for orders placed or performed in visit on 09/28/17  POCT glucose (manual entry)  Result Value Ref Range   POC Glucose 115 (A) 70 - 99 mg/dl  POCT glycosylated hemoglobin (Hb A1C)  Result Value Ref Range   Hemoglobin A1C 5.5      ASSESSMENT AND PLAN: 1. Essential hypertension Continue Norvasc and DASH diet - CBC - Comprehensive metabolic panel - Lipid panel  2. Prediabetes Tolerating metformin.  Continue to encourage healthy eating habits and regular exercise - POCT glucose (manual entry) - POCT glycosylated hemoglobin (Hb A1C)  3. Vitamin B 12  deficiency - Vitamin B12  4. Vitamin D deficiency - VITAMIN D 25 Hydroxy (Vit-D Deficiency, Fractures)  5. Age-related osteoporosis without current pathological fracture Continue Fosamax  6. Need for vaccination against Streptococcus pneumoniae using pneumococcal conjugate vaccine 13 - Pneumococcal conjugate vaccine 13-valent IM  Patient was given the opportunity to ask questions.  Patient verbalized understanding of the plan and was able to repeat key elements of the plan.  [employer] interpreter used during this encounter.  Orders Placed This Encounter  Procedures  . Pneumococcal conjugate vaccine 13-valent IM  . CBC  . Comprehensive metabolic panel  .  Lipid panel  . VITAMIN D 25 Hydroxy (Vit-D Deficiency, Fractures)  . Vitamin B12  . POCT glucose (manual entry)  . POCT glycosylated hemoglobin (Hb A1C)     Requested Prescriptions    No prescriptions requested or ordered in this encounter    Return in about 4 months (around 01/28/2018).  Karle Plumber, MD, FACP

## 2017-09-29 ENCOUNTER — Other Ambulatory Visit: Payer: Self-pay | Admitting: Pharmacist

## 2017-09-29 ENCOUNTER — Telehealth: Payer: Self-pay | Admitting: Internal Medicine

## 2017-09-29 DIAGNOSIS — I10 Essential (primary) hypertension: Secondary | ICD-10-CM

## 2017-09-29 DIAGNOSIS — D509 Iron deficiency anemia, unspecified: Secondary | ICD-10-CM

## 2017-09-29 LAB — LIPID PANEL
CHOL/HDL RATIO: 2.8 ratio (ref 0.0–4.4)
Cholesterol, Total: 185 mg/dL (ref 100–199)
HDL: 65 mg/dL (ref >39)
LDL Calculated: 94 mg/dL (ref 0–99)
TRIGLYCERIDES: 128 mg/dL (ref 0–149)
VLDL Cholesterol Cal: 26 mg/dL (ref 5–40)

## 2017-09-29 LAB — COMPREHENSIVE METABOLIC PANEL
A/G RATIO: 1.4 (ref 1.2–2.2)
ALT: 16 IU/L (ref 0–32)
AST: 21 IU/L (ref 0–40)
Albumin: 4 g/dL (ref 3.5–4.8)
Alkaline Phosphatase: 71 IU/L (ref 39–117)
BUN / CREAT RATIO: 29 — AB (ref 12–28)
BUN: 16 mg/dL (ref 8–27)
Bilirubin Total: 0.4 mg/dL (ref 0.0–1.2)
CALCIUM: 8.9 mg/dL (ref 8.7–10.3)
CO2: 21 mmol/L (ref 20–29)
Chloride: 107 mmol/L — ABNORMAL HIGH (ref 96–106)
Creatinine, Ser: 0.55 mg/dL — ABNORMAL LOW (ref 0.57–1.00)
GFR, EST AFRICAN AMERICAN: 108 mL/min/{1.73_m2} (ref >59)
GFR, EST NON AFRICAN AMERICAN: 93 mL/min/{1.73_m2} (ref >59)
GLOBULIN, TOTAL: 2.8 g/dL (ref 1.5–4.5)
Glucose: 95 mg/dL (ref 65–99)
POTASSIUM: 4.5 mmol/L (ref 3.5–5.2)
SODIUM: 140 mmol/L (ref 134–144)
Total Protein: 6.8 g/dL (ref 6.0–8.5)

## 2017-09-29 LAB — CBC
HEMATOCRIT: 20.1 % — AB (ref 34.0–46.6)
Hemoglobin: 5.6 g/dL — CL (ref 11.1–15.9)
MCH: 18.5 pg — ABNORMAL LOW (ref 26.6–33.0)
MCHC: 27.9 g/dL — AB (ref 31.5–35.7)
MCV: 67 fL — ABNORMAL LOW (ref 79–97)
Platelets: 515 10*3/uL — ABNORMAL HIGH (ref 150–379)
RBC: 3.02 x10E6/uL — ABNORMAL LOW (ref 3.77–5.28)
RDW: 21.3 % — AB (ref 12.3–15.4)
WBC: 5.8 10*3/uL (ref 3.4–10.8)

## 2017-09-29 LAB — VITAMIN D 25 HYDROXY (VIT D DEFICIENCY, FRACTURES): VIT D 25 HYDROXY: 29.7 ng/mL — AB (ref 30.0–100.0)

## 2017-09-29 LAB — VITAMIN B12: Vitamin B-12: 457 pg/mL (ref 232–1245)

## 2017-09-29 MED ORDER — AMLODIPINE BESYLATE 5 MG PO TABS: 2.5000 mg | ORAL_TABLET | Freq: Every day | ORAL | 6 refills | Status: DC

## 2017-09-29 MED ORDER — VITAMIN D (CHOLECALCIFEROL) 10 MCG (400 UNIT) PO CHEW: 800.0000 [IU] | CHEWABLE_TABLET | Freq: Every day | ORAL | 6 refills | Status: DC

## 2017-09-29 MED ORDER — FERROUS SULFATE 325 (65 FE) MG PO TABS: 325.0000 mg | ORAL_TABLET | Freq: Every day | ORAL | 3 refills | Status: DC

## 2017-09-29 MED ORDER — METFORMIN HCL 500 MG PO TABS: 500.0000 mg | ORAL_TABLET | Freq: Every day | ORAL | 0 refills | Status: DC

## 2017-09-29 NOTE — Telephone Encounter (Signed)
Pt called to receive results but Nurse was unavailable. Please follow up

## 2017-09-29 NOTE — Telephone Encounter (Signed)
Phone call placed to patient today to go over results of blood test done yesterday.  [place] (ID [phone]) used.  VM left advising patient to call my office as soon as possible and asked to speak with my nurse to review the results of blood tests. When patient calls back please let her know that she is very anemic.  If she is having any black stools, fatigue, shortness of breath or dizziness (any of these symptoms) she should be seen in the emergency room to receive blood transfusion today.  She will need to start taking iron supplement again twice a day.  [facility]  I also recommend referral to gastroenterology for them to look in her stomach and make sure she does not have any source of bleeding.  She already had a colonoscopy a year ago but will need an endoscopy Vitamin D level is low.  I have sent a prescription for vitamin D supplement Give f/u visit in 5 wks for recheck.

## 2017-09-30 NOTE — Telephone Encounter (Signed)
[place] [phone] returned pt call and pt didn't answer lvm asking pt to give me a call at her earliest convenience

## 2017-09-30 NOTE — Telephone Encounter (Signed)
[place] [id number] returned pt call and went over lab results with pt and Dr. Wynetta Emery response. Pt states she understands.    Pt states she had a endoscopy last year cause that's when she knew about hernia and they told her to come back in 3 years. Pt also want to know should she get vit b12  Message has been forward to pcp

## 2017-09-30 NOTE — Telephone Encounter (Signed)
Pt. Returned nurse call regarding results. Please f/u with pt.

## 2017-09-30 NOTE — Telephone Encounter (Signed)
[place] [phone] returned pt call and pt didn't answer lvm asking pt to give me a call at her earliest convenience

## 2017-09-30 NOTE — Telephone Encounter (Signed)
[place] [id number] returned pt call and went over lab results with pt and Dr. Wynetta Emery response. Pt states she understands.    Pt states she had a endoscopy last year cause that's when she knew about hernia and they told her to come back in 3 years. Pt also want to know should she get vit b12

## 2017-10-04 ENCOUNTER — Telehealth: Payer: Self-pay | Admitting: Internal Medicine

## 2017-10-04 MED ORDER — SOLIFENACIN SUCCINATE 10 MG PO TABS: 10.0000 mg | ORAL_TABLET | Freq: Every day | ORAL | 3 refills | Status: DC

## 2017-10-04 NOTE — Telephone Encounter (Signed)
Called pt and LVM to call back in regards to her Iron Pills, if she calls back Inform Pt that Per Dr.Johnson she must take the Iron pills twice a day or go to the ER for a blood transfusion but even then she must continue taking the pills.

## 2017-10-04 NOTE — Telephone Encounter (Signed)
Refilled

## 2017-10-04 NOTE — Telephone Encounter (Signed)
Pt called back , she was inform of the note of the pcp left, she did not like the idea for her to continue taking the medication, she also was inform that if she does not agree with the note,she can go to the ED and do a blood transfusion, she still did not like that and she said that she is going to change pcp, she was told that is fine if she want to do that.

## 2017-10-04 NOTE — Telephone Encounter (Signed)
Spoke with Pt while Rolm Gala translated and pt states she has not been taking her iron pills. Pt states she is not suppose to take those iron pills because her hernia. Pt had questions about her Vit B12 I informed pt that we checked her level when she came in for her appointment and her Vit B12 was normal. I informed pt that I will that I will have to send provider a message to see what she wants her to do in regards to the iron pills. Please f/u

## 2017-10-04 NOTE — Telephone Encounter (Signed)
Pt called to get a refill for solifenacin (VESICARE) 10 MG tablet [id number] [address] Please follow up

## 2017-10-04 NOTE — Telephone Encounter (Signed)
Pt came in to speak with a nurse in regards to her medications, Please follow up

## 2017-10-05 ENCOUNTER — Telehealth: Payer: Self-pay | Admitting: Internal Medicine

## 2017-10-05 NOTE — Telephone Encounter (Signed)
Phone call placed to patient today using [employer] interpreters Barnetta [place] [phone]).  I left a message informing patient that I was attempting to to reach her again to speak with her about her lab test results. If patient calls back please let her know that I recommend that she takes iron supplement 1 tablet twice a day to get her blood count back up.  If her blood count continues to drop she will start experiencing dizziness, shortness of breath and weakness.  I am not sure who told her that she should not take iron because she has hernia but this is not a contraindication to her being placed on iron.  If she does not wish to take oral iron I can refer her to a hematologist to be considered for intravenous iron.

## 2017-10-07 ENCOUNTER — Telehealth: Payer: Self-pay | Admitting: *Deleted

## 2017-10-07 DIAGNOSIS — E611 Iron deficiency: Secondary | ICD-10-CM

## 2017-10-07 NOTE — Telephone Encounter (Signed)
According to RN who spoke with pt today, pt stated that iron upset her stomach in the past.  Was given IV iron in past when she lived in [place].  Would like to be referred to hematologist as I suggested.  Referral placed.

## 2017-10-07 NOTE — Telephone Encounter (Signed)
Late entry from 1416; spk to patient with her family member and permission. She was informed of the necessity to have iron d/t hemoglobin. Pt verbalized understanding and voiced she has taken iron in the past when she live in [place].  Informed patient referral would be placed to hematology for further evaluation.

## 2017-10-13 ENCOUNTER — Telehealth: Payer: Self-pay | Admitting: Hematology

## 2017-10-13 NOTE — Telephone Encounter (Signed)
Received a call from Alinda Sierras at [facility] to schedule the pt an urgent appt. Pt has been scheduled to see Dr. Irene Limbo on 3/28 at 1pm. Alinda Sierras will notify the pt of the appt date and time. Aware the pt should arrive 30 minutes early.
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
## 2017-10-14 ENCOUNTER — Inpatient Hospital Stay: Payer: Medicare Other | Attending: Hematology | Admitting: Hematology

## 2017-10-14 ENCOUNTER — Other Ambulatory Visit: Payer: Self-pay

## 2017-10-14 ENCOUNTER — Encounter: Payer: Self-pay | Admitting: Hematology

## 2017-10-14 ENCOUNTER — Encounter (HOSPITAL_COMMUNITY): Payer: Self-pay

## 2017-10-14 ENCOUNTER — Observation Stay (HOSPITAL_COMMUNITY)
Admission: EM | Admit: 2017-10-14 | Discharge: 2017-10-15 | Disposition: A | Discharge status: Home / Self Care | Payer: Medicare Other | Attending: Internal Medicine | Admitting: Internal Medicine

## 2017-10-14 ENCOUNTER — Inpatient Hospital Stay: Payer: Medicare Other

## 2017-10-14 VITALS — BP 141/77 | HR 87 | Temp 98.5°F | Resp 18 | Ht 60.0 in | Wt 139.5 lb

## 2017-10-14 DIAGNOSIS — Z7983 Long term (current) use of bisphosphonates: Secondary | ICD-10-CM | POA: Insufficient documentation

## 2017-10-14 DIAGNOSIS — D500 Iron deficiency anemia secondary to blood loss (chronic): Secondary | ICD-10-CM | POA: Insufficient documentation

## 2017-10-14 DIAGNOSIS — Z79899 Other long term (current) drug therapy: Secondary | ICD-10-CM | POA: Diagnosis not present

## 2017-10-14 DIAGNOSIS — E559 Vitamin D deficiency, unspecified: Secondary | ICD-10-CM | POA: Diagnosis not present

## 2017-10-14 DIAGNOSIS — E538 Deficiency of other specified B group vitamins: Secondary | ICD-10-CM | POA: Diagnosis not present

## 2017-10-14 DIAGNOSIS — M810 Age-related osteoporosis without current pathological fracture: Secondary | ICD-10-CM | POA: Insufficient documentation

## 2017-10-14 DIAGNOSIS — K219 Gastro-esophageal reflux disease without esophagitis: Secondary | ICD-10-CM

## 2017-10-14 DIAGNOSIS — Z888 Allergy status to other drugs, medicaments and biological substances status: Secondary | ICD-10-CM | POA: Diagnosis not present

## 2017-10-14 DIAGNOSIS — K449 Diaphragmatic hernia without obstruction or gangrene: Secondary | ICD-10-CM | POA: Insufficient documentation

## 2017-10-14 DIAGNOSIS — F5089 Other specified eating disorder: Secondary | ICD-10-CM | POA: Diagnosis not present

## 2017-10-14 DIAGNOSIS — E119 Type 2 diabetes mellitus without complications: Secondary | ICD-10-CM | POA: Insufficient documentation

## 2017-10-14 DIAGNOSIS — Z7984 Long term (current) use of oral hypoglycemic drugs: Secondary | ICD-10-CM | POA: Diagnosis not present

## 2017-10-14 DIAGNOSIS — I10 Essential (primary) hypertension: Secondary | ICD-10-CM | POA: Diagnosis not present

## 2017-10-14 DIAGNOSIS — R55 Syncope and collapse: Secondary | ICD-10-CM | POA: Insufficient documentation

## 2017-10-14 DIAGNOSIS — D509 Iron deficiency anemia, unspecified: Secondary | ICD-10-CM

## 2017-10-14 DIAGNOSIS — L659 Nonscarring hair loss, unspecified: Secondary | ICD-10-CM | POA: Insufficient documentation

## 2017-10-14 DIAGNOSIS — M1990 Unspecified osteoarthritis, unspecified site: Secondary | ICD-10-CM | POA: Diagnosis not present

## 2017-10-14 DIAGNOSIS — Z791 Long term (current) use of non-steroidal anti-inflammatories (NSAID): Secondary | ICD-10-CM | POA: Diagnosis not present

## 2017-10-14 DIAGNOSIS — D649 Anemia, unspecified: Secondary | ICD-10-CM

## 2017-10-14 DIAGNOSIS — Z91013 Allergy to seafood: Secondary | ICD-10-CM | POA: Insufficient documentation

## 2017-10-14 DIAGNOSIS — Z91018 Allergy to other foods: Secondary | ICD-10-CM | POA: Insufficient documentation

## 2017-10-14 DIAGNOSIS — R42 Dizziness and giddiness: Secondary | ICD-10-CM

## 2017-10-14 DIAGNOSIS — R630 Anorexia: Secondary | ICD-10-CM

## 2017-10-14 LAB — CMP (CANCER CENTER ONLY)
ALT: 22 U/L (ref 0–55)
AST: 25 U/L (ref 5–34)
Albumin: 3.9 g/dL (ref 3.5–5.0)
Alkaline Phosphatase: 80 U/L (ref 40–150)
Anion gap: 9 (ref 3–11)
BILIRUBIN TOTAL: 0.4 mg/dL (ref 0.2–1.2)
BUN: 15 mg/dL (ref 7–26)
CHLORIDE: 107 mmol/L (ref 98–109)
CO2: 23 mmol/L (ref 22–29)
CREATININE: 0.72 mg/dL (ref 0.60–1.10)
Calcium: 9.5 mg/dL (ref 8.4–10.4)
GFR, Est AFR Am: >60 mL/min (ref >60)
Glucose, Bld: 100 mg/dL (ref 70–140)
Potassium: 4.3 mmol/L (ref 3.5–5.1)
Sodium: 139 mmol/L (ref 136–145)
Total Protein: 8.2 g/dL (ref 6.4–8.3)

## 2017-10-14 LAB — URINALYSIS, ROUTINE W REFLEX MICROSCOPIC
BACTERIA UA: NONE SEEN
Bilirubin Urine: NEGATIVE
Glucose, UA: NEGATIVE mg/dL
Hgb urine dipstick: NEGATIVE
Ketones, ur: NEGATIVE mg/dL
Nitrite: NEGATIVE
Protein, ur: NEGATIVE mg/dL
SPECIFIC GRAVITY, URINE: 1.004 — AB (ref 1.005–1.030)
pH: 8 (ref 5.0–8.0)

## 2017-10-14 LAB — VITAMIN B12: Vitamin B-12: 421 pg/mL (ref 180–914)

## 2017-10-14 LAB — CBC WITH DIFFERENTIAL (CANCER CENTER ONLY)
BASOS ABS: 0.2 10*3/uL — AB (ref 0.0–0.1)
Basophils Relative: 3 %
EOS ABS: 0.1 10*3/uL (ref 0.0–0.5)
EOS PCT: 1 %
HCT: 24.4 % — ABNORMAL LOW (ref 34.8–46.6)
HEMOGLOBIN: 7.1 g/dL — AB (ref 11.6–15.9)
LYMPHS ABS: 1.6 10*3/uL (ref 0.9–3.3)
LYMPHS PCT: 25 %
MCH: 20.1 pg — AB (ref 25.1–34.0)
MCHC: 28.9 g/dL — ABNORMAL LOW (ref 31.5–36.0)
MCV: 69.4 fL — AB (ref 79.5–101.0)
Monocytes Absolute: 0.4 10*3/uL (ref 0.1–0.9)
Monocytes Relative: 6 %
NEUTROS PCT: 65 %
Neutro Abs: 4.3 10*3/uL (ref 1.5–6.5)
PLATELETS: 575 10*3/uL — AB (ref 145–400)
RBC: 3.52 MIL/uL — ABNORMAL LOW (ref 3.70–5.45)
RDW: 25.1 % — ABNORMAL HIGH (ref 11.2–14.5)
WBC Count: 6.6 10*3/uL (ref 3.9–10.3)

## 2017-10-14 LAB — PREPARE RBC (CROSSMATCH)

## 2017-10-14 LAB — CBC WITH DIFFERENTIAL/PLATELET
Basophils Absolute: 0.1 10*3/uL (ref 0.0–0.1)
Basophils Relative: 1 %
EOS PCT: 2 %
Eosinophils Absolute: 0.1 10*3/uL (ref 0.0–0.7)
HCT: 25.5 % — ABNORMAL LOW (ref 36.0–46.0)
Hemoglobin: 6.6 g/dL — CL (ref 12.0–15.0)
LYMPHS ABS: 1.9 10*3/uL (ref 0.7–4.0)
Lymphocytes Relative: 26 %
MCH: 19.2 pg — ABNORMAL LOW (ref 26.0–34.0)
MCHC: 25.9 g/dL — AB (ref 30.0–36.0)
MCV: 74.3 fL — AB (ref 78.0–100.0)
MONOS PCT: 7 %
Monocytes Absolute: 0.5 10*3/uL (ref 0.1–1.0)
NEUTROS ABS: 4.7 10*3/uL (ref 1.7–7.7)
Neutrophils Relative %: 64 %
Platelets: 526 10*3/uL — ABNORMAL HIGH (ref 150–400)
RBC: 3.43 MIL/uL — ABNORMAL LOW (ref 3.87–5.11)
RDW: 26.5 % — AB (ref 11.5–15.5)
WBC: 7.3 10*3/uL (ref 4.0–10.5)

## 2017-10-14 LAB — COMPREHENSIVE METABOLIC PANEL
ALBUMIN: 3.7 g/dL (ref 3.5–5.0)
ALT: 20 U/L (ref 14–54)
AST: 27 U/L (ref 15–41)
Alkaline Phosphatase: 69 U/L (ref 38–126)
Anion gap: 7 (ref 5–15)
BILIRUBIN TOTAL: 0.4 mg/dL (ref 0.3–1.2)
BUN: 16 mg/dL (ref 6–20)
CHLORIDE: 107 mmol/L (ref 101–111)
CO2: 25 mmol/L (ref 22–32)
Calcium: 8.9 mg/dL (ref 8.9–10.3)
Creatinine, Ser: 0.59 mg/dL (ref 0.44–1.00)
GFR calc Af Amer: >60 mL/min (ref >60)
GFR calc non Af Amer: >60 mL/min (ref >60)
GLUCOSE: 104 mg/dL — AB (ref 65–99)
POTASSIUM: 4.3 mmol/L (ref 3.5–5.1)
Sodium: 139 mmol/L (ref 135–145)
Total Protein: 6.9 g/dL (ref 6.5–8.1)

## 2017-10-14 LAB — IRON AND TIBC
Iron: 14 ug/dL — ABNORMAL LOW (ref 41–142)
Saturation Ratios: 3 % — ABNORMAL LOW (ref 21–57)
TIBC: 429 ug/dL (ref 236–444)
UIBC: 415 ug/dL

## 2017-10-14 LAB — RETICULOCYTES
RBC.: 3.58 MIL/uL — ABNORMAL LOW (ref 3.87–5.11)
RETIC COUNT ABSOLUTE: 179 10*3/uL (ref 19.0–186.0)
RETIC CT PCT: 5 % — AB (ref 0.4–3.1)

## 2017-10-14 LAB — POC OCCULT BLOOD, ED: Fecal Occult Bld: NEGATIVE

## 2017-10-14 LAB — TROPONIN I: Troponin I: <0.03 ng/mL (ref <0.03)

## 2017-10-14 LAB — FERRITIN: FERRITIN: 10 ng/mL (ref 9–269)

## 2017-10-14 MED ORDER — SODIUM CHLORIDE 0.9 % IV SOLN
510.0000 mg | Freq: Once | INTRAVENOUS | Status: DC
  Filled 2017-10-14: qty 17

## 2017-10-14 MED ORDER — FERUMOXYTOL INJECTION 510 MG/17 ML
510.0000 mg | Freq: Once | INTRAVENOUS | Status: AC
  Administered 2017-10-14: 510 mg via INTRAVENOUS
  Filled 2017-10-14 (×2): qty 17

## 2017-10-14 MED ORDER — SODIUM CHLORIDE 0.9 % IV SOLN
10.0000 mL/h | Freq: Once | INTRAVENOUS | Status: AC
  Administered 2017-10-14: 10 mL/h via INTRAVENOUS

## 2017-10-14 MED ORDER — SODIUM CHLORIDE 0.9 % IV BOLUS
1000.0000 mL | Freq: Once | INTRAVENOUS | Status: AC
  Administered 2017-10-14: 1000 mL via INTRAVENOUS

## 2017-10-14 MED ORDER — PANTOPRAZOLE SODIUM 40 MG PO TBEC
40.0000 mg | DELAYED_RELEASE_TABLET | Freq: Every day | ORAL | Status: DC
  Administered 2017-10-14 – 2017-10-15 (×2): 40 mg via ORAL
  Filled 2017-10-14 (×2): qty 1

## 2017-10-14 MED ORDER — AMLODIPINE BESYLATE 5 MG PO TABS
2.5000 mg | ORAL_TABLET | Freq: Every day | ORAL | Status: DC
  Administered 2017-10-14 – 2017-10-15 (×2): 2.5 mg via ORAL
  Filled 2017-10-14 (×2): qty 1
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
## 2017-10-14 NOTE — ED Triage Notes (Signed)
5.6 Hgb 2 weeks ago. Labs resent from the [facility] today. Increased fatigue, dizziness, decreased appetite- for 2+ weeks.

## 2017-10-14 NOTE — H&P (Signed)
History and Physical    Jodi [name] GYI:[id number] DOB: 02/18/1946 DOA: 10/14/2017  PCP: Ladell Pier, MD Patient coming from: Home.  Patient lives alone she has 2 sons in the [place] area.  She speaks Spanish I spoke to her through interpreter.  Chief Complaint: Generalized weakness  HPI: Jodi [name] is a 72 y.o. female with medical history significant of anemia, hypertension, diabetes was sent in from Dr. Pollie Meyer office.  Patient had a hemoglobin of 5.62 weeks ago with her primary care physician.  Looks like patient apparently has a history of chronic anemia that she was taking IV iron and blood transfusion when she was living in [place] in 2012 prior to moving to [place].  Today she drove to the hematology office she almost passed out and her hemoglobin was ordered stat and it was 6.6 her MCV was 74 and she was sent to the ER for blood transfusion.  Patient denies any vaginal bleeding hematemesis emesis or melena.  She reports taking NSAIDs for pain and Fosamax for osteoporosis.  Patient was seen by Awilda Bill almost a year ago with EGD and colonoscopy and was told she has hiatal hernia.  Her main complaint today is generalized weakness and dizziness which has been ongoing for over 2 months.  She denied any chest pain shortness of breath nausea vomiting diarrhea.  Her stools are black.  She takes iron tablets at home.  She also carries a history of B12 deficiency.    ED Course: In the ER her fecal occult blood test was negative hemoglobin 6.6 MCV 74.  2 units of blood transfusion has been ordered which has not been given yet.  I have called in a consult to [facility] for patient to be seen tomorrow. Review of Systems: As per HPI otherwise all other systems reviewed and are negative   Past Medical History:  Diagnosis Date  . Anemia   . Diabetes mellitus without complication ([place])   . Hypertension   . Insomnia     Past Surgical History:  Procedure Laterality Date  .  CESAREAN SECTION    . SHOULDER SURGERY Left 2010    Social History   Socioeconomic History  . Marital status: Single    Spouse name: Not on file  . Number of children: Not on file  . Years of education: Not on file  . Highest education level: Not on file  Occupational History  . Not on file  Social Needs  . Financial resource strain: Not on file  . Food insecurity:    Worry: Not on file    Inability: Not on file  . Transportation needs:    Medical: Not on file    Non-medical: Not on file  Tobacco Use  . Smoking status: Never Smoker  . Smokeless tobacco: Never Used  Substance and Sexual Activity  . Alcohol use: No  . Drug use: No  . Sexual activity: Not on file  Lifestyle  . Physical activity:    Days per week: Not on file    Minutes per session: Not on file  . Stress: Not on file  Relationships  . Social connections:    Talks on phone: Not on file    Gets together: Not on file    Attends religious service: Not on file    Active member of club or organization: Not on file    Attends meetings of clubs or organizations: Not on file    Relationship status: Not on file  .  Intimate partner violence:    Fear of current or ex partner: Not on file    Emotionally abused: Not on file    Physically abused: Not on file    Forced sexual activity: Not on file  Other Topics Concern  . Not on file  Social History Narrative  . Not on file    Allergies  Allergen Reactions  . Cortisone     Rash, swelling  . Gabapentin Nausea Only    With dizziness  . Pork-Derived Products     Rash, joint pain  . Shellfish Allergy Rash and Other (See Comments)    joint pain, GI pain    No family history on file.    Prior to Admission medications   Medication Sig Start Date End Date Taking? Authorizing Provider  amLODipine (NORVASC) 5 MG tablet Take 0.5 tablets (2.5 mg total) by mouth daily. 09/29/17  Yes Ladell Pier, MD  CALCIUM PO Take 1 tablet by mouth daily.   Yes [provider]  DEXILANT 60 MG capsule TOME UNA CAPSULA TODOS LOS DIAS Patient taking differently: Take 1 capsule a day 05/17/17  Yes Ladell Pier, MD  diclofenac sodium (VOLTAREN) 1 % GEL Apply 2 g topically 4 (four) times daily. Patient taking differently: Apply 2 g topically 4 (four) times daily as needed.  08/10/17  Yes Ladell Pier, MD  Iron-FA-B Cmp-C-Biot-Probiotic (FUSION PLUS) CAPS Take 1 tablet by mouth every other day. As tolerated 10/10/16  Yes Langeland, Dawn T, MD  metFORMIN (GLUCOPHAGE) 500 MG tablet Take 1 tablet (500 mg total) by mouth daily with breakfast. 09/29/17  Yes Ladell Pier, MD  polyethylene glycol powder (GLYCOLAX/MIRALAX) powder Take 17 g by mouth 2 (two) times daily as needed. Patient taking differently: Take 17 g by mouth 2 (two) times daily as needed (constipation).  10/07/16  Yes Langeland, Dawn T, MD  solifenacin (VESICARE) 10 MG tablet Take 1 tablet (10 mg total) by mouth daily. 10/04/17  Yes Ladell Pier, MD  traZODone (DESYREL) 50 MG tablet Take 0.5 tablets (25 mg total) by mouth at bedtime as needed for sleep. 03/16/17  Yes Ladell Pier, MD  Vitamin D, Cholecalciferol, 400 units CHEW Chew 2 tablets (800 Units total) by mouth daily. 09/29/17  Yes Ladell Pier, MD  ACCU-CHEK SOFTCLIX LANCETS lancets Use as instructed to test blood glucose twice daily. 07/15/16   Maren Reamer, MD  alendronate (FOSAMAX) 70 MG tablet Take 1 tablet (70 mg total) by mouth every 7 (seven) days. Take with a full glass of water on an empty stomach. 01/22/17   Charlott Rakes, MD  Blood Glucose Monitoring Suppl (ACCU-CHEK AVIVA PLUS) w/Device KIT To test blood glucose twice daily. 07/15/16   Maren Reamer, MD  Elastic Bandages & Supports (LUMBAR BACK BRACE/SUPPORT PAD) MISC 1 application by Does not apply route daily. 04/02/16   Langeland, Dawn T, MD  glucose blood (ACCU-CHEK AVIVA PLUS) test strip Use as instructed to test blood glucose 2 times daily.  07/15/16   Maren Reamer, MD  Lancet Devices [facility]) lancets Use as instructed to test blood glucose twice daily. 07/15/16   Langeland, Dawn T, MD  senna-docusate (SENOKOT-S) 8.6-50 MG tablet Take 1 tablet by mouth at bedtime as needed for mild constipation. 10/07/16   Maren Reamer, MD    Physical Exam: Vitals:   10/14/17 1443 10/14/17 1500 10/14/17 1504 10/14/17 1530  BP:  131/66  (!) 144/78  Pulse:  69  72 86  Resp:  (!) 21 19 (!) 30  Temp:      TempSrc:      SpO2:  100% 100% 90%  Weight: 63 kg (139 lb)     Height: 5' (1.524 m)        General:  Appears calm and comfortable Eyes:  PERRL, EOMI, normal lids, iris, conjunctiva pale. ENT:  grossly normal hearing, lips & tongue, mmm Neck:  no LAD, masses or thyromegaly Cardiovascular: RRR, no m/r/g. No LE edema.  Respiratory:  CTA bilaterally, no w/r/r. Normal respiratory effort. Abdomen:  soft, ntnd, NABS Skin: no rash or induration seen on limited exam Musculoskeletal: grossly normal tone BUE/BLE, good ROM, no bony abnormality Psychiatric:  grossly normal mood and affect, speech fluent and appropriate, AOx3 Neurologic:  CN 2-12 grossly intact, moves all extremities in coordinated fashion, sensation intact  Labs on Admission: I have personally reviewed following labs and imaging studies  CBC: Recent Labs  Lab 10/14/17 1359 10/14/17 1459  WBC 6.6 7.3  NEUTROABS 4.3 4.7  HGB  --  6.6*  HCT 24.4* 25.5*  MCV 69.4* 74.3*  PLT 575* 517*   Basic Metabolic Panel: Recent Labs  Lab 10/14/17 1359 10/14/17 1459  NA 139 139  K 4.3 4.3  CL 107 107  CO2 23 25  GLUCOSE 100 104*  BUN 15 16  CREATININE 0.72 0.59  CALCIUM 9.5 8.9   GFR: Estimated Creatinine Clearance: 51.1 mL/min (by C-G formula based on SCr of 0.59 mg/dL). Liver Function Tests: Recent Labs  Lab 10/14/17 1359 10/14/17 1459  AST 25 27  ALT 22 20  ALKPHOS 80 69  BILITOT 0.4 0.4  PROT 8.2 6.9  ALBUMIN 3.9 3.7   No results for  input(s): LIPASE, AMYLASE in the last 168 hours. No results for input(s): AMMONIA in the last 168 hours. Coagulation Profile: No results for input(s): INR, PROTIME in the last 168 hours. Cardiac Enzymes: Recent Labs  Lab 10/14/17 1459  TROPONINI <0.03   BNP (last 3 results) No results for input(s): PROBNP in the last 8760 hours. HbA1C: No results for input(s): HGBA1C in the last 72 hours. CBG: No results for input(s): GLUCAP in the last 168 hours. Lipid Profile: No results for input(s): CHOL, HDL, LDLCALC, TRIG, CHOLHDL, LDLDIRECT in the last 72 hours. Thyroid Function Tests: No results for input(s): TSH, T4TOTAL, FREET4, T3FREE, THYROIDAB in the last 72 hours. Anemia Panel: Recent Labs    10/14/17 1359 10/14/17 1400  FERRITIN  --  10  TIBC  --  429  IRON  --  14*  RETICCTPCT 5.0*  --    Urine analysis:    Component Value Date/Time   BILIRUBINUR negative 10/05/2016 1547   PROTEINUR negative 10/05/2016 1547   UROBILINOGEN 0.2 10/05/2016 1547   NITRITE negative 10/05/2016 1547   LEUKOCYTESUR Negative 10/05/2016 1547    Creatinine Clearance: Estimated Creatinine Clearance: 51.1 mL/min (by C-G formula based on SCr of 0.59 mg/dL).  Sepsis Labs: _0 (procalcitonin:4,lacticidven:4) )No results found for this or any previous visit (from the past 240 hour(s)).   Radiological Exams on Admission: No results found.  EKG: Independently reviewed.  Assessment/Plan Active Problems:   Anemia   1] iron deficiency anemia-of unclear etiology.  No evidence of active bleeding noted at this time.  FOBT negative.  Iron panel has been sent and shows very low iron with low ferritin.  2 units of blood transfusion has been ordered.  GI consult has been placed with a [facility] for  possible repetition of colonoscopy or EGD.  Will obtain folate and B12 level and follow-up H&H tomorrow.  She does carry a history of arthritis and takes Voltaren gel and NSAIDs at home she is also on Fosamax  question if she has any underlying gastritis.  Patient does take Dexilant 60 mg at home which will be restarted.  I will hold Fosamax.  2] type 2 diabetes patient on metformin at home which will be on hold during the workup while she is in hospital.  3] hypertension restart Norvasc.   DVT prophylaxis SCD  code Status: Full code Family Communication: No family available Disposition Plan: TBD Consults called: [facility] has been called, discussed with Dr. Irene Limbo who will see the patient tomorrow.  GI will see the patient tomorrow. Admission status Observation  Georgette Shell MD [facility]  If 7PM-7AM, please contact night-coverage [email or web address] Password [facility]  10/14/2017, 4:17 PM

## 2017-10-14 NOTE — ED Notes (Addendum)
CRITICAL VALUE STICKER  CRITICAL VALUE: Hgb 6.6   RECEIVER (on-site recipient of call): Anderson Malta, [place] NOTIFIED: [phone] 10/14/17  MESSENGER (representative from lab): Ulice Dash  MD NOTIFIED: Dr. Gilford Raid  TIME OF NOTIFICATION: 4132 RESPONSE:  To be determine

## 2017-10-14 NOTE — ED Notes (Signed)
Bed: WA02 Expected date:  Expected time:  Means of arrival:  Comments: Pt from [facility], low hgb

## 2017-10-14 NOTE — ED Notes (Signed)
WICK PUT IN PLACE TO COLLECT URINE SAMPLE 

## 2017-10-14 NOTE — ED Provider Notes (Signed)
[facility] DEPT Provider Note   CSN: [id number] Arrival date & time: 10/14/17  1420     History   Chief Complaint Chief Complaint  Patient presents with  . Abnormal Lab    HPI Jodi [name] is a 72 y.o. female.  Pt presents to the ED today from Dr. Grier Mitts office.  She was referred there by her pcp due to severe iron deficiency anemia.  She speaks Spanish and information is obtained via Opt[facility].  The pt first started feeling more tired about 3 weeks ago.  She has had anemia in the past and had an EGD and colonoscopy last year by [facility] which was nl.  The pt had blood work done at [facility] on 3/12 which showed a hgb of 5.6 and MCV of 67.  These lab findings were what prompted pcp to send pt to hematology.  Pt has not yet received any blood for this anemia.  She did have a blood transfusion in 2012 when she lived in [place].  She also had IV iron transfusion then.  She has had black stool, but thought that was due to the iron she's been taking.  Dr. Grier Mitts office recommended blood transfusion and IV iron infusion.         Past Medical History:  Diagnosis Date  . Anemia   . Diabetes mellitus without complication ([place])   . Hypertension   . Insomnia     Patient Active Problem List   Diagnosis Date Noted  . Iron deficiency anemia due to chronic blood loss 10/14/2017  . Diabetes mellitus without complication ([place]) 44/31/5400  . HTN (hypertension), benign 03/16/2017  . Primary insomnia 03/16/2017  . Osteoporosis 01/08/2017  . GERD (gastroesophageal reflux disease) 01/08/2017  . Vitamin D deficiency 01/08/2017  . Edema 03/13/2014    Past Surgical History:  Procedure Laterality Date  . CESAREAN SECTION    . SHOULDER SURGERY Left 2010     OB History   None      Home Medications    Prior to Admission medications   Medication Sig Start Date End Date Taking? Authorizing Provider  amLODipine (NORVASC) 5 MG tablet Take 0.5 tablets  (2.5 mg total) by mouth daily. 09/29/17  Yes Ladell Pier, MD  CALCIUM PO Take 1 tablet by mouth daily.   Yes [provider]  DEXILANT 60 MG capsule TOME UNA CAPSULA TODOS LOS DIAS Patient taking differently: Take 1 capsule a day 05/17/17  Yes Ladell Pier, MD  diclofenac sodium (VOLTAREN) 1 % GEL Apply 2 g topically 4 (four) times daily. Patient taking differently: Apply 2 g topically 4 (four) times daily as needed.  08/10/17  Yes Ladell Pier, MD  ferrous sulfate (FERROUSUL) 325 (65 FE) MG tablet Take 1 tablet (325 mg total) by mouth daily with breakfast. 09/29/17  Yes Ladell Pier, MD  Iron-FA-B Cmp-C-Biot-Probiotic (FUSION PLUS) CAPS Take 1 tablet by mouth every other day. As tolerated 10/10/16  Yes Langeland, Dawn T, MD  metFORMIN (GLUCOPHAGE) 500 MG tablet Take 1 tablet (500 mg total) by mouth daily with breakfast. 09/29/17  Yes Ladell Pier, MD  polyethylene glycol powder (GLYCOLAX/MIRALAX) powder Take 17 g by mouth 2 (two) times daily as needed. Patient taking differently: Take 17 g by mouth 2 (two) times daily as needed (constipation).  10/07/16  Yes Langeland, Dawn T, MD  solifenacin (VESICARE) 10 MG tablet Take 1 tablet (10 mg total) by mouth daily. 10/04/17  Yes Karle Plumber  B, MD  traZODone (DESYREL) 50 MG tablet Take 0.5 tablets (25 mg total) by mouth at bedtime as needed for sleep. 03/16/17  Yes Ladell Pier, MD  Vitamin D, Cholecalciferol, 400 units CHEW Chew 2 tablets (800 Units total) by mouth daily. 09/29/17  Yes Ladell Pier, MD  ACCU-CHEK SOFTCLIX LANCETS lancets Use as instructed to test blood glucose twice daily. 07/15/16   Maren Reamer, MD  alendronate (FOSAMAX) 70 MG tablet Take 1 tablet (70 mg total) by mouth every 7 (seven) days. Take with a full glass of water on an empty stomach. 01/22/17   Charlott Rakes, MD  Blood Glucose Monitoring Suppl (ACCU-CHEK AVIVA PLUS) w/Device KIT To test blood glucose twice daily. 07/15/16    Maren Reamer, MD  Elastic Bandages & Supports (LUMBAR BACK BRACE/SUPPORT PAD) MISC 1 application by Does not apply route daily. 04/02/16   Langeland, Dawn T, MD  glucose blood (ACCU-CHEK AVIVA PLUS) test strip Use as instructed to test blood glucose 2 times daily. 07/15/16   Maren Reamer, MD  Lancet Devices [facility]) lancets Use as instructed to test blood glucose twice daily. 07/15/16   Langeland, Dawn T, MD  senna-docusate (SENOKOT-S) 8.6-50 MG tablet Take 1 tablet by mouth at bedtime as needed for mild constipation. 10/07/16   Maren Reamer, MD    Family History No family history on file.  Social History Social History   Tobacco Use  . Smoking status: Never Smoker  . Smokeless tobacco: Never Used  Substance Use Topics  . Alcohol use: No  . Drug use: No     Allergies   Cortisone; Gabapentin; Pork-derived products; and Shellfish allergy   Review of Systems Review of Systems  Constitutional: Positive for fatigue.  Neurological: Positive for weakness.  All other systems reviewed and are negative.    Physical Exam Updated Vital Signs BP (!) 144/78   Pulse 86   Temp 98.5 F (36.9 C) (Oral)   Resp (!) 30   Ht 5' (1.524 m)   Wt 63 kg (139 lb)   SpO2 90%   BMI 27.15 kg/m   Physical Exam  Constitutional: She is oriented to person, place, and time. She appears well-developed and well-nourished.  HENT:  Head: Normocephalic and atraumatic.  Right Ear: External ear normal.  Left Ear: External ear normal.  Nose: Nose normal.  Mouth/Throat: Oropharynx is clear and moist.  Eyes: Pupils are equal, round, and reactive to light. EOM are normal.  Pale conjunctivae  Neck: Normal range of motion. Neck supple.  Cardiovascular: Normal rate, regular rhythm, normal heart sounds and intact distal pulses.  Pulmonary/Chest: Effort normal and breath sounds normal.  Abdominal: Soft. Bowel sounds are normal.  Genitourinary: Rectal exam shows guaiac negative  stool.  Musculoskeletal: Normal range of motion.  Neurological: She is alert and oriented to person, place, and time.  Skin: Skin is warm and dry. Capillary refill takes less than 2 seconds.  Psychiatric: She has a normal mood and affect. Her behavior is normal. Judgment and thought content normal.  Nursing note and vitals reviewed.    ED Treatments / Results  Labs (all labs ordered are listed, but only abnormal results are displayed) Labs Reviewed  COMPREHENSIVE METABOLIC PANEL - Abnormal; Notable for the following components:      Result Value   Glucose, Bld 104 (*)    All other components within normal limits  CBC WITH DIFFERENTIAL/PLATELET - Abnormal; Notable for the following components:   RBC 3.43 (*)  Hemoglobin 6.6 (*)    HCT 25.5 (*)    MCV 74.3 (*)    MCH 19.2 (*)    MCHC 25.9 (*)    RDW 26.5 (*)    Platelets 526 (*)    All other components within normal limits  TROPONIN I  URINALYSIS, ROUTINE W REFLEX MICROSCOPIC  POC OCCULT BLOOD, ED  TYPE AND SCREEN  PREPARE RBC (CROSSMATCH)    EKG None  Radiology No results found.  Procedures Procedures (including critical care time)  Medications Ordered in ED Medications  sodium chloride 0.9 % bolus 1,000 mL (1,000 mLs Intravenous New Bag/Given 10/14/17 1504)  0.9 %  sodium chloride infusion (10 mL/hr Intravenous New Bag/Given 10/14/17 1540)     Initial Impression / Assessment and Plan / ED Course  I have reviewed the triage vital signs and the nursing notes.  Pertinent labs & imaging results that were available during my care of the patient were reviewed by me and considered in my medical decision making (see chart for details).  2 units of prbcs ordered for transfusion.    CRITICAL CARE Performed by: Isla Pence   Total critical care time: 30 minutes  Critical care time was exclusive of separately billable procedures and treating other patients.  Critical care was necessary to treat or prevent  imminent or life-threatening deterioration.  Critical care was time spent personally by me on the following activities: development of treatment plan with patient and/or surrogate as well as nursing, discussions with consultants, evaluation of patient's response to treatment, examination of patient, obtaining history from patient or surrogate, ordering and performing treatments and interventions, ordering and review of laboratory studies, ordering and review of radiographic studies, pulse oximetry and re-evaluation of patient's condition.  Pt d/w Dr. Rodena Piety ([facility]) for admission.  Final Clinical Impressions(s) / ED Diagnoses   Final diagnoses:  Symptomatic anemia    ED Discharge Orders    None       Isla Pence, MD 10/14/17 [phone]

## 2017-10-15 DIAGNOSIS — Z91013 Allergy to seafood: Secondary | ICD-10-CM

## 2017-10-15 DIAGNOSIS — D649 Anemia, unspecified: Secondary | ICD-10-CM

## 2017-10-15 DIAGNOSIS — D500 Iron deficiency anemia secondary to blood loss (chronic): Secondary | ICD-10-CM | POA: Diagnosis not present

## 2017-10-15 DIAGNOSIS — K254 Chronic or unspecified gastric ulcer with hemorrhage: Secondary | ICD-10-CM

## 2017-10-15 DIAGNOSIS — R195 Other fecal abnormalities: Secondary | ICD-10-CM | POA: Diagnosis not present

## 2017-10-15 DIAGNOSIS — D509 Iron deficiency anemia, unspecified: Secondary | ICD-10-CM | POA: Diagnosis not present

## 2017-10-15 DIAGNOSIS — K219 Gastro-esophageal reflux disease without esophagitis: Secondary | ICD-10-CM

## 2017-10-15 DIAGNOSIS — Z888 Allergy status to other drugs, medicaments and biological substances status: Secondary | ICD-10-CM

## 2017-10-15 LAB — CBC WITH DIFFERENTIAL/PLATELET
BASOS PCT: 1 %
Basophils Absolute: 0.1 10*3/uL (ref 0.0–0.1)
Eosinophils Absolute: 0.2 10*3/uL (ref 0.0–0.7)
Eosinophils Relative: 3 %
HEMATOCRIT: 34.1 % — AB (ref 36.0–46.0)
HEMOGLOBIN: 10.2 g/dL — AB (ref 12.0–15.0)
LYMPHS PCT: 24 %
Lymphs Abs: 1.2 10*3/uL (ref 0.7–4.0)
MCH: 23.1 pg — AB (ref 26.0–34.0)
MCHC: 29.9 g/dL — AB (ref 30.0–36.0)
MCV: 77.1 fL — ABNORMAL LOW (ref 78.0–100.0)
MONOS PCT: 9 %
Monocytes Absolute: 0.5 10*3/uL (ref 0.1–1.0)
Neutro Abs: 3.1 10*3/uL (ref 1.7–7.7)
Neutrophils Relative %: 63 %
Platelets: 466 10*3/uL — ABNORMAL HIGH (ref 150–400)
RBC: 4.42 MIL/uL (ref 3.87–5.11)
RDW: 25 % — ABNORMAL HIGH (ref 11.5–15.5)
WBC: 5.1 10*3/uL (ref 4.0–10.5)

## 2017-10-15 LAB — COMPREHENSIVE METABOLIC PANEL
ALK PHOS: 74 U/L (ref 38–126)
ALT: 19 U/L (ref 14–54)
ANION GAP: 8 (ref 5–15)
AST: 24 U/L (ref 15–41)
Albumin: 3.7 g/dL (ref 3.5–5.0)
BILIRUBIN TOTAL: 2 mg/dL — AB (ref 0.3–1.2)
BUN: 13 mg/dL (ref 6–20)
CO2: 26 mmol/L (ref 22–32)
Calcium: 9.1 mg/dL (ref 8.9–10.3)
Chloride: 110 mmol/L (ref 101–111)
Creatinine, Ser: 0.59 mg/dL (ref 0.44–1.00)
GFR calc non Af Amer: >60 mL/min (ref >60)
Glucose, Bld: 104 mg/dL — ABNORMAL HIGH (ref 65–99)
Potassium: 4 mmol/L (ref 3.5–5.1)
Sodium: 144 mmol/L (ref 135–145)
TOTAL PROTEIN: 6.7 g/dL (ref 6.5–8.1)

## 2017-10-15 LAB — PROTIME-INR
INR: 1.04
Prothrombin Time: 13.5 seconds (ref 11.4–15.2)

## 2017-10-15 LAB — MAGNESIUM: Magnesium: 2.4 mg/dL (ref 1.7–2.4)

## 2017-10-15 LAB — ANTI-PARIETAL ANTIBODY: Parietal Cell Antibody-IgG: 1.5 Units (ref 0.0–20.0)

## 2017-10-15 LAB — PHOSPHORUS: PHOSPHORUS: 3.1 mg/dL (ref 2.5–4.6)

## 2017-10-15 LAB — APTT: APTT: 26 s (ref 24–36)

## 2017-10-15 LAB — INTRINSIC FACTOR ANTIBODIES: Intrinsic Factor: 1 AU/mL (ref 0.0–1.1)

## 2017-10-15 LAB — VITAMIN B12: Vitamin B-12: 353 pg/mL (ref 180–914)

## 2017-10-15 LAB — ABO/RH: ABO/RH(D): A POS

## 2017-10-15 MED ORDER — DEXLANSOPRAZOLE 60 MG PO CPDR
DELAYED_RELEASE_CAPSULE | ORAL | 0 refills | Status: DC
Start: 2017-10-15 — End: 2018-07-01

## 2017-10-15 NOTE — Progress Notes (Signed)
Jodi [name]   HEMATOLOGY/ONCOLOGY INPATIENT PROGRESS NOTE  Date of Service: 10/15/2017  Inpatient Attending: .Raiford Noble Chrisney, DO   SUBJECTIVE  Patient was seen with her son at bedside.  She notes that she feels much better after the IV iron and 2 units of PRBC transfusion with improvement of her hemoglobin up to 10.  No dizziness no lightheadedness no presyncopal symptoms at this time. GI was consulted and recommended outpatient follow-up with Dr. Penelope Coop for consideration of endoscopy.  They believe blood loss is likely from from [place] ulceration related to her hiatal hernia .  We discussed absolute avoidance of blood thinners, all NSAIDs and also discontinuation of Fosamax. She has been scheduled for outpatient additional dose of IV iron and outpatient follow-up with [place] for management of her iron deficiency anemia.  OBJECTIVE:  NAD  PHYSICAL EXAMINATION: . Vitals:   10/15/17 0210 10/15/17 0245 10/15/17 0300 10/15/17 0615  BP: 130/70 130/70 134/70   Pulse: 70 70 70 70  Resp: 16 16 16 16   Temp: 98.6 F (37 C) 98.6 F (37 C) 98.1 F (36.7 C) 98 F (36.7 C)  TempSrc: Oral Oral Oral Oral  SpO2: 99% 99% 94% 95%  Weight:      Height:       Filed Weights   10/14/17 1443 10/14/17 1707  Weight: 139 lb (63 kg) 140 lb 9.6 oz (63.8 kg)   .Body mass index is 27.46 kg/m.  GENERAL:alert, in no acute distress and comfortable SKIN: skin color, texture, turgor are normal, no rashes or significant lesions EYES: normal, conjunctival pallor improved, sclera clear OROPHARYNX:no exudate, no erythema and lips, buccal mucosa, and tongue normal  NECK: supple, no JVD, thyroid normal size, non-tender, without nodularity LYMPH:  no palpable lymphadenopathy in the cervical, axillary or inguinal LUNGS: clear to auscultation with normal respiratory effort HEART: regular rate & rhythm,  no murmurs and no lower extremity edema ABDOMEN: abdomen soft, non-tender, normoactive bowel sounds  PSYCH: alert  & oriented x 3 with fluent speech NEURO: no focal motor/sensory deficits  MEDICAL HISTORY:  Past Medical History:  Diagnosis Date  . Anemia   . Diabetes mellitus without complication ([place])   . Hypertension   . Insomnia     SURGICAL HISTORY: Past Surgical History:  Procedure Laterality Date  . CESAREAN SECTION    . SHOULDER SURGERY Left 2010    SOCIAL HISTORY: Social History   Socioeconomic History  . Marital status: Single    Spouse name: Not on file  . Number of children: Not on file  . Years of education: Not on file  . Highest education level: Not on file  Occupational History  . Not on file  Social Needs  . Financial resource strain: Not on file  . Food insecurity:    Worry: Not on file    Inability: Not on file  . Transportation needs:    Medical: Not on file    Non-medical: Not on file  Tobacco Use  . Smoking status: Never Smoker  . Smokeless tobacco: Never Used  Substance and Sexual Activity  . Alcohol use: No  . Drug use: No  . Sexual activity: Not on file  Lifestyle  . Physical activity:    Days per week: Not on file    Minutes per session: Not on file  . Stress: Not on file  Relationships  . Social connections:    Talks on phone: Not on file    Gets together: Not on file  Attends religious service: Not on file    Active member of club or organization: Not on file    Attends meetings of clubs or organizations: Not on file    Relationship status: Not on file  . Intimate partner violence:    Fear of current or ex partner: Not on file    Emotionally abused: Not on file    Physically abused: Not on file    Forced sexual activity: Not on file  Other Topics Concern  . Not on file  Social History Narrative  . Not on file    FAMILY HISTORY: No family history on file.  ALLERGIES:  is allergic to cortisone; gabapentin; pork-derived products; and shellfish allergy.  MEDICATIONS:  Scheduled Meds: . amLODipine  2.5 mg Oral Daily  .  pantoprazole  40 mg Oral Daily   Continuous Infusions: PRN Meds:.  REVIEW OF SYSTEMS:    10 Point review of Systems was done is negative except as noted above.   LABORATORY DATA:  I have reviewed the data as listed  . CBC Latest Ref Rng & Units 10/15/2017 10/14/2017 10/14/2017  WBC 4.0 - 10.5 K/uL 5.1 7.3 6.6  Hemoglobin 12.0 - 15.0 g/dL 10.2(L) 6.6(LL) -  Hematocrit 36.0 - 46.0 % 34.1(L) 25.5(L) 24.4(L)  Platelets 150 - 400 K/uL 466(H) 526(H) 575(H)    . CMP Latest Ref Rng & Units 10/15/2017 10/14/2017 10/14/2017  Glucose 65 - 99 mg/dL 104(H) 104(H) 100  BUN 6 - 20 mg/dL 13 16 15   Creatinine 0.44 - 1.00 mg/dL 0.59 0.59 0.72  Sodium 135 - 145 mmol/L 144 139 139  Potassium 3.5 - 5.1 mmol/L 4.0 4.3 4.3  Chloride 101 - 111 mmol/L 110 107 107  CO2 22 - 32 mmol/L 26 25 23   Calcium 8.9 - 10.3 mg/dL 9.1 8.9 9.5  Total Protein 6.5 - 8.1 g/dL 6.7 6.9 8.2  Total Bilirubin 0.3 - 1.2 mg/dL 2.0(H) 0.4 0.4  Alkaline Phos 38 - 126 U/L 74 69 80  AST 15 - 41 U/L 24 27 25   ALT 14 - 54 U/L 19 20 22     RADIOGRAPHIC STUDIES: I have personally reviewed the radiological images as listed and agreed with the findings in the report. No results found.  ASSESSMENT & PLAN:   72 year old with  #1 Severe symptomatic iron deficiency anemia with hgb of 6.6 on admission (and previously 5.6) hgb is now up to 10.2 after PRBC x 2 and IV Feraheme 510mg  x 1 dose.  #2 Severe Iron deficiency likely from chronic GI bleeding from [place] related to her hiatal hernia.  PLAN -Appreciate input from the hospitalist team and GI. -She will be discharged on PPIs -counseled on antireflux measures per GI. -Recommended absolute avoidance of NSAIDs and also would recommend discontinuation of Fosamax. -Could consider Prolia-for management of osteoporosis if needed or other parental options. -She received 1 dose of Feraheme in the hospital and 2 units of PRBCs. -She is scheduled for an additional dose of IV  iron-in the outpatient hematology clinic on 10/20/2017 -I shall see her back in clinic on 11/19/2017 with repeat labs to manage her iron deficiency anemia. -Outpatient GI follow-up to determine need for repeat endoscopy or other workup for GI bleeding. -Continue follow-up with primary care physician for other medical issues -OK to discharge from hematology standpoint.  I spent 20 minutes counseling the patient face to face. The total time spent in the appointment was 25 minutes and more than 50% was on counseling and direct  patient cares and co-ordination of care with Dr Cherie Dark medicine.    Sullivan Lone MD [place] AAHIVMS [facility] Hematology/Oncology Physician [facility]  (Office):       [phone] (Work cell):  [phone] (Fax):           [phone]  10/15/2017 1:43 PM

## 2017-10-15 NOTE — Discharge Summary (Addendum)
Physician Discharge Summary  Jodi [name] MEQ:[id number] DOB: April 05, 1946 DOA: 10/14/2017  PCP: Ladell Pier, MD  Admit date: 10/14/2017 Discharge date: 10/15/2017  Admitted From: Home Disposition:  Home  Recommendations for Outpatient Follow-up:  1. Follow up with PCP in 1-2 weeks 2. Follow up with Oncology Dr. Irene Limbo as an outpatient for continued Transfusions 3. Follow up with Dr. Penelope Coop of [facility] 4. Please obtain CMP/CBC, Mag, Phos in one week 5. Please follow up on the following pending results:  Home Health: No Equipment/Devices: None recommended by PT  Discharge Condition: Stable CODE STATUS: FULL CODE Diet recommendation: Heart Healthy Diet   Brief/Interim Summary: Jodi [name] is a 72 y.o. female with medical history significant of anemia, hypertension, diabetes was sent in from Dr. Pollie Meyer office.  Patient had a hemoglobin of 5.6 2  weeks ago with her primary care physician.  Looks like patient apparently has a history of chronic anemia that she was taking IV iron and blood transfusion when she was living in [place] in 2012 prior to moving to [place].   Yesterday she drove to the hematology office she almost passed out and her hemoglobin was ordered stat and it was 6.6 her MCV was 74 and she was sent to the ER for blood transfusion.  Patient denies any vaginal bleeding hematemesis emesis or melena.  She reports taking NSAIDs for pain and Fosamax for osteoporosis.    Patient was seen by [facility] almost a year ago with EGD and colonoscopy and was told she has hiatal hernia with Cameron's lesions.  Her main complaint today is generalized weakness and dizziness which has been ongoing for over 2 months.  She denied any chest pain shortness of breath nausea vomiting diarrhea.  Her stools are black.  She takes iron tablets at home.  She also carries a history of B12 deficiency. She was admitted for Symptomatic Anemia and given 2 units of blood as well as an  Iron Transfusion. I saw and evaluated her with the Help of the [facility] [phone].  [facility] evaluated and recommended PPI, minimizing NSAIDs, conservative antireflux measures and further iron therapy. Because Hb/Hct responded Dr. Paulita Fujita recommended no Endoscopy this Admission and stated that if anemia becomes a "recurrent issue over the long term, despite PPI and iron therapy, next step in management likely repeat EGD and surgical consideration of hiatal hernia repair.  These would be longterm and outpatient considerations." Case was discussed with Dr. Paulita Fujita and he felt from a GI perspective she could be D/C'd Home and followed up by Dr. Penelope Coop  Dr. Irene Limbo of Oncology also evaluated the patient who agreed with therapy and also recommended avoidance of NSAID's and discontinuation of Fosomax. He scheduled the patient for additional Iron Infusions with subsequent follow up. She was evaluated by PT as well who recommended no follow up. She was deemed medically stable to D/C Home and will need to follow up with PCP, Hematology, and Gastroenterology as an outpatient.   Discharge Diagnoses:  Active Problems:   Anemia  Symptomatic Iron deficiency anemia likely from Cameron's Ulcers in Hiatal Hernia, improved - No evidence of active bleeding noted at this time.  FOBT negative.   -Iron panel has been sent and shows very low iron with low ferritin.   -2 units of blood transfusion has been ordered and transfused along with IV Iron.  -GI consult has been placed with a [facility] for possible repetition of colonoscopy or EGD but they recommended conservative measures  and medical therapy given improvement in Hemoglobin   -She does carry a history of arthritis and takes Voltaren gel and NSAIDs at home she is also on Fosamax; Fosomax discontinued and patient  -Patient does take Dexilant 60 mg at home which will be restarted.  -GI recommending no Endoscopy this Admission and stated that if anemia becomes a  "recurrent issue over the long term, despite PPI and iron therapy, next step in management likely repeat EGD and surgical consideration of hiatal hernia repair."  Type 2 Diabetes  -C/w Home Meformin  Hypertension -Resume Norvasc   Hyperbilirubinemia -Mild at 2.0 -Follow up CMP as an outpatient   GERD -C/w Home PPI  Discharge Instructions  Discharge Instructions    Call MD for:  difficulty breathing, headache or visual disturbances   Complete by:  As directed    Call MD for:  extreme fatigue   Complete by:  As directed    Call MD for:  hives   Complete by:  As directed    Call MD for:  persistant dizziness or light-headedness   Complete by:  As directed    Call MD for:  persistant nausea and vomiting   Complete by:  As directed    Call MD for:  redness, tenderness, or signs of infection (pain, swelling, redness, odor or green/yellow discharge around incision site)   Complete by:  As directed    Call MD for:  severe uncontrolled pain   Complete by:  As directed    Call MD for:  temperature >100.4   Complete by:  As directed    Diet - low sodium heart healthy   Complete by:  As directed    Discharge instructions   Complete by:  As directed    Follow up with PCP, Gastroenterology Dr. Penelope Coop, and Hematology Dr. Irene Limbo as an outpatient. Take all medications as prescribed. If symptoms change or worsen please return to the ED for evaluation.   Increase activity slowly   Complete by:  As directed      Allergies as of 10/15/2017      Reactions   Cortisone    Rash, swelling   Gabapentin Nausea Only   With dizziness   Pork-derived Products    Rash, joint pain   Shellfish Allergy Rash, Other (See Comments)   joint pain, GI pain      Medication List    TAKE these medications   ACCU-CHEK AVIVA PLUS w/Device Kit To test blood glucose twice daily.   accu-chek softclix lancets Use as instructed to test blood glucose twice daily.   ACCU-CHEK SOFTCLIX LANCETS lancets Use  as instructed to test blood glucose twice daily.   alendronate 70 MG tablet Commonly known as:  FOSAMAX Take 1 tablet (70 mg total) by mouth every 7 (seven) days. Take with a full glass of water on an empty stomach.   amLODipine 5 MG tablet Commonly known as:  NORVASC Take 0.5 tablets (2.5 mg total) by mouth daily.   CALCIUM PO Take 1 tablet by mouth daily.   dexlansoprazole 60 MG capsule Commonly known as:  DEXILANT Take 1 capsule a day   diclofenac sodium 1 % Gel Commonly known as:  VOLTAREN Apply 2 g topically 4 (four) times daily. What changed:    when to take this  reasons to take this   FUSION PLUS Caps Take 1 tablet by mouth every other day. As tolerated   glucose blood test strip Commonly known as:  ACCU-CHEK AVIVA PLUS  Use as instructed to test blood glucose 2 times daily.   Lumbar Back Brace/Support Pad Misc 1 application by Does not apply route daily.   metFORMIN 500 MG tablet Commonly known as:  GLUCOPHAGE Take 1 tablet (500 mg total) by mouth daily with breakfast.   polyethylene glycol powder powder Commonly known as:  GLYCOLAX/MIRALAX Take 17 g by mouth 2 (two) times daily as needed. What changed:  reasons to take this   senna-docusate 8.6-50 MG tablet Commonly known as:  Senokot-S Take 1 tablet by mouth at bedtime as needed for mild constipation.   solifenacin 10 MG tablet Commonly known as:  VESICARE Take 1 tablet (10 mg total) by mouth daily.   traZODone 50 MG tablet Commonly known as:  DESYREL Take 0.5 tablets (25 mg total) by mouth at bedtime as needed for sleep.   Vitamin D (Cholecalciferol) 400 units Chew Chew 2 tablets (800 Units total) by mouth daily.       Allergies  Allergen Reactions  . Cortisone     Rash, swelling  . Gabapentin Nausea Only    With dizziness  . Pork-Derived Products     Rash, joint pain  . Shellfish Allergy Rash and Other (See Comments)    joint pain, GI pain   Consultations:  Gastroenterology    Hematology  Procedures/Studies:  No results found.  Subjective: Seen and examined at bedside and was feeling better. No CP or SOB. Walked with Therapy and had no dizziness. Ready to go home.  Discharge Exam: Vitals:   10/15/17 0300 10/15/17 0615  BP: 134/70   Pulse: 70 70  Resp: 16 16  Temp: 98.1 F (36.7 C) 98 F (36.7 C)  SpO2: 94% 95%   Vitals:   10/15/17 0210 10/15/17 0245 10/15/17 0300 10/15/17 0615  BP: 130/70 130/70 134/70   Pulse: 70 70 70 70  Resp: _0 Temp: 98.6 F (37 C) 98.6 F (37 C) 98.1 F (36.7 C) 98 F (36.7 C)  TempSrc: Oral Oral Oral Oral  SpO2: 99% 99% 94% 95%  Weight:      Height:       General: Pt is alert, awake, not in acute distress Cardiovascular: RRR, S1/S2 +, no rubs, no gallops Respiratory: CTA bilaterally, no wheezing, no rhonchi Abdominal: Soft, NT, ND, bowel sounds + Extremities: no edema, no cyanosis  The results of significant diagnostics from this hospitalization (including imaging, microbiology, ancillary and laboratory) are listed below for reference.    Microbiology: No results found for this or any previous visit (from the past 240 hour(s)).   Labs: BNP (last 3 results) No results for input(s): BNP in the last 8760 hours. Basic Metabolic Panel: Recent Labs  Lab 10/14/17 1359 10/14/17 1459 10/15/17 0820  NA 139 139 144  K 4.3 4.3 4.0  CL 107 107 110  CO2 _1 GLUCOSE 100 104* 104*  BUN _2 CREATININE 0.72 0.59 0.59  CALCIUM 9.5 8.9 9.1  MG  --   --  2.4  PHOS  --   --  3.1   Liver Function Tests: Recent Labs  Lab 10/14/17 1359 10/14/17 1459 10/15/17 0820  AST _3 ALT _4 ALKPHOS 80 69 74  BILITOT 0.4 0.4 2.0*  PROT 8.2 6.9 6.7  ALBUMIN 3.9 3.7 3.7   No results for input(s): LIPASE, AMYLASE in the last 168 hours. No results for input(s): AMMONIA in the last 168 hours. CBC:  Recent Labs  Lab 10/14/17 1359 10/14/17 1459 10/15/17 0820  WBC 6.6 7.3 5.1  NEUTROABS  4.3 4.7 3.1  HGB  --  6.6* 10.2*  HCT 24.4* 25.5* 34.1*  MCV 69.4* 74.3* 77.1*  PLT 575* 526* 466*   Cardiac Enzymes: Recent Labs  Lab 10/14/17 1459  TROPONINI <0.03   BNP: Invalid input(s): POCBNP CBG: No results for input(s): GLUCAP in the last 168 hours. D-Dimer No results for input(s): DDIMER in the last 72 hours. Hgb A1c No results for input(s): HGBA1C in the last 72 hours. Lipid Profile No results for input(s): CHOL, HDL, LDLCALC, TRIG, CHOLHDL, LDLDIRECT in the last 72 hours. Thyroid function studies No results for input(s): TSH, T4TOTAL, T3FREE, THYROIDAB in the last 72 hours.  Invalid input(s): FREET3 Anemia work up Recent Labs    10/14/17 1359 10/14/17 1400 10/15/17 0820  VITAMINB12  --  421 353  FERRITIN  --  10  --   TIBC  --  429  --   IRON  --  14*  --   RETICCTPCT 5.0*  --   --    Urinalysis    Component Value Date/Time   COLORURINE STRAW (A) 10/14/2017 1634   APPEARANCEUR CLEAR 10/14/2017 1634   LABSPEC 1.004 (L) 10/14/2017 1634   PHURINE 8.0 10/14/2017 1634   GLUCOSEU NEGATIVE 10/14/2017 1634   HGBUR NEGATIVE 10/14/2017 1634   BILIRUBINUR NEGATIVE 10/14/2017 1634   BILIRUBINUR negative 10/05/2016 1547   KETONESUR NEGATIVE 10/14/2017 1634   PROTEINUR NEGATIVE 10/14/2017 1634   UROBILINOGEN 0.2 10/05/2016 1547   NITRITE NEGATIVE 10/14/2017 1634   LEUKOCYTESUR TRACE (A) 10/14/2017 1634   Sepsis Labs Invalid input(s): PROCALCITONIN,  WBC,  LACTICIDVEN Microbiology No results found for this or any previous visit (from the past 240 hour(s)).  Time coordinating discharge: 35 minutes  SIGNED:  Kerney Elbe, DO [facility] 10/15/2017, 1:11 PM Pager [phone]  If 7PM-7AM, please contact night-coverage [email or web address] Password [email or web address]

## 2017-10-15 NOTE — Evaluation (Signed)
Physical Therapy Evaluation Patient Details Name: Jodi [name] MRN: [id number] DOB: November 04, 1945 Today's Date: 10/15/2017   History of Present Illness   72 y.o. female admitted for dizziness, weakness, anemia.  Clinical Impression  Pt is independent with mobility, she ambulated 200' without an assistive device, no loss of balance, vital signs stable. She is ready to DC home from PT standpoint. No further PT indicated, will sign off. Encouraged pt to ambulate in halls to minimize deconditioning.    Follow Up Recommendations No PT follow up    Equipment Recommendations  None recommended by PT    Recommendations for Other Services       Precautions / Restrictions Precautions Precautions: None Precaution Comments: denies falls in past 1 year Restrictions Weight Bearing Restrictions: No      Mobility  Bed Mobility Overal bed mobility: Independent                Transfers Overall transfer level: Independent                  Ambulation/Gait Ambulation/Gait assistance: Independent Ambulation Distance (Feet): 220 Feet Assistive device: None Gait Pattern/deviations: WFL(Within Functional Limits)   Gait velocity interpretation: at or above normal speed for age/gender General Gait Details: steady, no loss of balance, VSS  Stairs            Wheelchair Mobility    Modified Rankin (Stroke Patients Only)       Balance Overall balance assessment: Independent                                           Pertinent Vitals/Pain Pain Assessment: No/denies pain    Home Living Family/patient expects to be discharged to:: Private residence Living Arrangements: Alone Available Help at Discharge: Family;Available PRN/intermittently   Home Access: Level entry     Home Layout: One level Home Equipment: None      Prior Function Level of Independence: Independent         Comments: drives, independent ADLs     Hand Dominance         Extremity/Trunk Assessment   Upper Extremity Assessment Upper Extremity Assessment: Overall WFL for tasks assessed    Lower Extremity Assessment Lower Extremity Assessment: Overall WFL for tasks assessed    Cervical / Trunk Assessment Cervical / Trunk Assessment: Normal  Communication   Communication: Prefers language other than English(Spanish, speaks a little [place], family interpreted)  Cognition Arousal/Alertness: Awake/alert Behavior During Therapy: WFL for tasks assessed/performed Overall Cognitive Status: Within Functional Limits for tasks assessed                                        General Comments      Exercises     Assessment/Plan    PT Assessment Patent does not need any further PT services  PT Problem List         PT Treatment Interventions      PT Goals (Current goals can be found in the Care Plan section)  Acute Rehab PT Goals PT Goal Formulation: All assessment and education complete, DC therapy    Frequency     Barriers to discharge        Co-evaluation  AM-PAC PT "6 Clicks" Daily Activity  Outcome Measure Difficulty turning over in bed (including adjusting bedclothes, sheets and blankets)?: None Difficulty moving from lying on back to sitting on the side of the bed? : None Difficulty sitting down on and standing up from a chair with arms (e.g., wheelchair, bedside commode, etc,.)?: None Help needed moving to and from a bed to chair (including a wheelchair)?: None Help needed walking in hospital room?: None Help needed climbing 3-5 steps with a railing? : None 6 Click Score: 24    End of Session Equipment Utilized During Treatment: Gait belt Activity Tolerance: Patient tolerated treatment well Patient left: in chair;with call bell/phone within reach;with family/visitor present Nurse Communication: Mobility status      Time: 1226-1238 PT Time Calculation (min) (ACUTE ONLY): 12  min   Charges:   PT Evaluation $PT Eval Low Complexity: 1 Low     PT G Codes:         Philomena Doheny 10/15/2017, 12:44 PM  [phone]

## 2017-10-15 NOTE — Consult Note (Signed)
[facility] Consultation Note  Referring Provider: Dr. Alfredia Ferguson [facility]) Primary Care Physician:  Ladell Pier, MD Primary Gastroenterologist:  Dr. Acquanetta Sit  Reason for Consultation:  Iron deficiency anemia  HPI: Jodi [name] is a 72 y.o. female admitted for dizziness, weakness, anemia. History obtained via Spanish interpretation from patient's daughter-in-law over telephone. Patient has 10+ year history of iron-deficiency anemia, and has had colonoscopy one year ago (normal) here in [place], and had egd/colon about 10 years ago for iron-deficiency anemia as well which showed large hiatal hernia with Cameron's erosions.  Patient reports only starting iron therapy about one week ago.  She has GERD, controlled with Dexilant.  No dysphagia, abdominal pain, change in bowel habits.  No black stools.  No blood in stool.  No hematemesis.   Past Medical History:  Diagnosis Date  . Anemia   . Diabetes mellitus without complication ([place])   . Hypertension   . Insomnia     Past Surgical History:  Procedure Laterality Date  . CESAREAN SECTION    . SHOULDER SURGERY Left 2010    Prior to Admission medications   Medication Sig Start Date End Date Taking? Authorizing Provider  amLODipine (NORVASC) 5 MG tablet Take 0.5 tablets (2.5 mg total) by mouth daily. 09/29/17  Yes Ladell Pier, MD  CALCIUM PO Take 1 tablet by mouth daily.   Yes [provider]  DEXILANT 60 MG capsule TOME UNA CAPSULA TODOS LOS DIAS Patient taking differently: Take 1 capsule a day 05/17/17  Yes Ladell Pier, MD  diclofenac sodium (VOLTAREN) 1 % GEL Apply 2 g topically 4 (four) times daily. Patient taking differently: Apply 2 g topically 4 (four) times daily as needed.  08/10/17  Yes Ladell Pier, MD  Iron-FA-B Cmp-C-Biot-Probiotic (FUSION PLUS) CAPS Take 1 tablet by mouth every other day. As tolerated 10/10/16  Yes Langeland, Dawn T, MD  metFORMIN (GLUCOPHAGE) 500 MG tablet Take 1 tablet  (500 mg total) by mouth daily with breakfast. 09/29/17  Yes Ladell Pier, MD  polyethylene glycol powder (GLYCOLAX/MIRALAX) powder Take 17 g by mouth 2 (two) times daily as needed. Patient taking differently: Take 17 g by mouth 2 (two) times daily as needed (constipation).  10/07/16  Yes Langeland, Dawn T, MD  solifenacin (VESICARE) 10 MG tablet Take 1 tablet (10 mg total) by mouth daily. 10/04/17  Yes Ladell Pier, MD  traZODone (DESYREL) 50 MG tablet Take 0.5 tablets (25 mg total) by mouth at bedtime as needed for sleep. 03/16/17  Yes Ladell Pier, MD  Vitamin D, Cholecalciferol, 400 units CHEW Chew 2 tablets (800 Units total) by mouth daily. 09/29/17  Yes Ladell Pier, MD  ACCU-CHEK SOFTCLIX LANCETS lancets Use as instructed to test blood glucose twice daily. 07/15/16   Maren Reamer, MD  alendronate (FOSAMAX) 70 MG tablet Take 1 tablet (70 mg total) by mouth every 7 (seven) days. Take with a full glass of water on an empty stomach. 01/22/17   Charlott Rakes, MD  Blood Glucose Monitoring Suppl (ACCU-CHEK AVIVA PLUS) w/Device KIT To test blood glucose twice daily. 07/15/16   Maren Reamer, MD  Elastic Bandages & Supports (LUMBAR BACK BRACE/SUPPORT PAD) MISC 1 application by Does not apply route daily. 04/02/16   Langeland, Dawn T, MD  glucose blood (ACCU-CHEK AVIVA PLUS) test strip Use as instructed to test blood glucose 2 times daily. 07/15/16   Maren Reamer, MD  Lancet Devices [facility]) lancets Use as instructed  to test blood glucose twice daily. 07/15/16   Langeland, Dawn T, MD  senna-docusate (SENOKOT-S) 8.6-50 MG tablet Take 1 tablet by mouth at bedtime as needed for mild constipation. 10/07/16   Maren Reamer, MD    Current Facility-Administered Medications  Medication Dose Route Frequency Provider Last Rate Last Dose  . amLODipine (NORVASC) tablet 2.5 mg  2.5 mg Oral Daily Georgette Shell, MD   2.5 mg at 10/14/17 1726  . pantoprazole  (PROTONIX) EC tablet 40 mg  40 mg Oral Daily Georgette Shell, MD   40 mg at 10/14/17 1726    Allergies as of 10/14/2017 - Review Complete 10/14/2017  Allergen Reaction Noted  . Cortisone  03/13/2014  . Gabapentin Nausea Only 04/28/2016  . Pork-derived products  03/13/2014  . Shellfish allergy Rash and Other (See Comments) 03/13/2014    No family history on file.  Social History   Socioeconomic History  . Marital status: Single    Spouse name: Not on file  . Number of children: Not on file  . Years of education: Not on file  . Highest education level: Not on file  Occupational History  . Not on file  Social Needs  . Financial resource strain: Not on file  . Food insecurity:    Worry: Not on file    Inability: Not on file  . Transportation needs:    Medical: Not on file    Non-medical: Not on file  Tobacco Use  . Smoking status: Never Smoker  . Smokeless tobacco: Never Used  Substance and Sexual Activity  . Alcohol use: No  . Drug use: No  . Sexual activity: Not on file  Lifestyle  . Physical activity:    Days per week: Not on file    Minutes per session: Not on file  . Stress: Not on file  Relationships  . Social connections:    Talks on phone: Not on file    Gets together: Not on file    Attends religious service: Not on file    Active member of club or organization: Not on file    Attends meetings of clubs or organizations: Not on file    Relationship status: Not on file  . Intimate partner violence:    Fear of current or ex partner: Not on file    Emotionally abused: Not on file    Physically abused: Not on file    Forced sexual activity: Not on file  Other Topics Concern  . Not on file  Social History Narrative  . Not on file    Review of Systems: As per HPI, all others negative  Physical Exam: Vital signs in last 24 hours: Temp:  [98 F (36.7 C)-98.9 F (37.2 C)] 98 F (36.7 C) (03/29 0615) Pulse Rate:  [69-92] 70 (03/29 0615) Resp:   [16-30] 16 (03/29 0615) BP: (117-154)/(62-85) 134/70 (03/29 0300) SpO2:  [90 %-100 %] 95 % (03/29 0615) Weight:  [139 lb (63 kg)-140 lb 9.6 oz (63.8 kg)] 140 lb 9.6 oz (63.8 kg) (03/28 1707)   General:   Alert,  Well-developed, well-nourished, pleasant and cooperative in NAD Head:  Normocephalic and atraumatic. Eyes:  Sclera clear, no icterus.   Conjunctiva pale Ears:  Normal auditory acuity. Nose:  No deformity, discharge,  or lesions. Mouth:  No deformity or lesions.  Oropharynx pale and dry. Neck:  Supple; no masses or thyromegaly. Lungs:  Clear throughout to auscultation.   No wheezes, crackles, or rhonchi. No acute  distress. Heart:  Regular rate and rhythm; no murmurs, clicks, rubs,  or gallops. Abdomen:  Soft, nontender and nondistended. No masses, hepatosplenomegaly or hernias noted. Normal bowel sounds, without guarding, and without rebound.     Msk:  Symmetrical without gross deformities. Normal posture. Pulses:  Normal pulses noted. Extremities:  Without clubbing or edema. Neurologic:  Alert and  oriented x4;  grossly normal neurologically. Skin:  Intact without significant lesions or rashes. Psych:  Alert and cooperative. Normal mood and affect.   Lab Results: Recent Labs    10/14/17 1359 10/14/17 1459  WBC 6.6 7.3  HGB  --  6.6*  HCT 24.4* 25.5*  PLT 575* 526*   BMET Recent Labs    10/14/17 1359 10/14/17 1459  NA 139 139  K 4.3 4.3  CL 107 107  CO2 23 25  GLUCOSE 100 104*  BUN 15 16  CREATININE 0.72 0.59  CALCIUM 9.5 8.9   LFT Recent Labs    10/14/17 1459  PROT 6.9  ALBUMIN 3.7  AST 27  ALT 20  ALKPHOS 69  BILITOT 0.4   PT/INR No results for input(s): LABPROT, INR in the last 72 hours.  Studies/Results: No results found.  Impression:  1.  Recurrent iron-deficiency anemia.  No overt GI bleeding.  Longstanding problem for at least 10 years.  Multiple prior endoscopies and colonoscopies.  She has known large hiatal hernia and prior  endoscopies have shown Cameron's erosions; this is likely cause of patient's anemia.  Patient reports having just started iron therapy within the past one week. 2.  Weakness, fatigue.  From anemia. 3.  GERD, likely from large hiatal hernia.  No dysphagia.  Plan:  1.  PPI.  OK to start diet. 2.  Minimize NSAIDs, as clinically feasible. 3.  Conservative antireflux measures (head of bed elevation 30 degrees, no late-night eating, watch spicy/greasy/caffeinated products). 4.  Iron therapy. 5.  Consider further blood transfusion. 6.   If Hgb doesn't appropriately respond to transfusion as inpatient, would do endoscopy this admission. 7.  On the other hand, if patient's Hgb responds appropriately to transfusion, patient would like to hold off on repeat endoscopy during this admission, and see if her anemia significantly improves with oral/parenteral iron therapy under care of Dr. Irene Limbo.   8.  If Hgb doesn't appropriately respond to transfusion as inpatient, would do endoscopy this admission. 9.  If anemia becomes recurrent issue over the long term, despite PPI and iron therapy, next step in management likely repeat EGD and surgical consideration of hiatal hernia repair.  These would be longterm and outpatient considerations. 10.  [facility] will follow.   LOS: 0 days   Aella Ronda M  10/15/2017, 7:55 AM  Cell [phone] If no answer or after 5 PM call [phone]

## 2017-10-16 LAB — TYPE AND SCREEN
ABO/RH(D): A POS
Antibody Screen: NEGATIVE
UNIT DIVISION: 0
Unit division: 0

## 2017-10-16 LAB — BPAM RBC
BLOOD PRODUCT EXPIRATION DATE: 201904092359
Blood Product Expiration Date: 201904092359
ISSUE DATE / TIME: 201903282115
ISSUE DATE / TIME: 201903290235
UNIT TYPE AND RH: 6200
Unit Type and Rh: 6200

## 2017-10-18 LAB — MULTIPLE MYELOMA PANEL, SERUM
ALBUMIN/GLOB SERPL: 1 (ref 0.7–1.7)
ALPHA2 GLOB SERPL ELPH-MCNC: 1 g/dL (ref 0.4–1.0)
Albumin SerPl Elph-Mcnc: 3.7 g/dL (ref 2.9–4.4)
Alpha 1: 0.3 g/dL (ref 0.0–0.4)
B-GLOBULIN SERPL ELPH-MCNC: 1.4 g/dL — AB (ref 0.7–1.3)
Gamma Glob SerPl Elph-Mcnc: 1.4 g/dL (ref 0.4–1.8)
Globulin, Total: 4.1 g/dL — ABNORMAL HIGH (ref 2.2–3.9)
IGG (IMMUNOGLOBIN G), SERUM: 1343 mg/dL (ref 700–1600)
IgA: 320 mg/dL (ref 64–422)
IgM (Immunoglobulin M), Srm: 136 mg/dL (ref 26–217)
Total Protein ELP: 7.8 g/dL (ref 6.0–8.5)

## 2017-10-18 LAB — FOLATE RBC
Folate, Hemolysate: 532.7 ng/mL
Folate, RBC: 1562 ng/mL (ref >498)
HEMATOCRIT: 34.1 % (ref 34.0–46.6)

## 2017-10-20 ENCOUNTER — Ambulatory Visit (HOSPITAL_COMMUNITY)
Admission: RE | Admit: 2017-10-20 | Discharge: 2017-10-20 | Disposition: A | Discharge status: Home / Self Care | Payer: Medicare Other | Source: Ambulatory Visit | Attending: Hematology | Admitting: Hematology

## 2017-10-20 ENCOUNTER — Encounter (HOSPITAL_COMMUNITY): Payer: Self-pay

## 2017-10-20 DIAGNOSIS — D509 Iron deficiency anemia, unspecified: Secondary | ICD-10-CM | POA: Diagnosis not present

## 2017-10-20 MED ORDER — SODIUM CHLORIDE 0.9 % IV SOLN
750.0000 mg | Freq: Once | INTRAVENOUS | Status: AC
  Administered 2017-10-20: 750 mg via INTRAVENOUS
  Filled 2017-10-20: qty 15

## 2017-10-20 NOTE — Discharge Instructions (Signed)
Ferric carboxymaltose injection O que  este medicamento? A CARBOXIMALTOSE FRRICA  um complexo de ferro. O ferro  usado para produzir glbulos vermelhos saudveis, que transportam oxignio e nutrientes por todo o corpo. Este medicamento  usado para tratar a anemia em pessoas com doena renal crnica ou pessoas que no podem receber ferro por via oral. Este medicamento pode ser usado para outros propsitos; em caso de dvidas, pergunte ao seu profissional de sade ou farmacutico. NOMES DE MARCAS COMUNS: Injectafer O que devo dizer a meu profissional de sade antes de tomar este medicamento? Precisam saber se voc tem algum dos seguintes problemas ou estados de sade: -anemia no provocada por carncia de ferro -nveis elevados de ferro no sangue -doenas hepticas -reao estranha ou alergia ao ferro -reao estranha ou alergia a outros medicamentos -reao estranha ou alergia a alimentos, corantes ou conservantes -est grvida ou tentando engravidar -est amamentando Como devo usar este medicamento? Este medicamento  para infuso intravenosa. Este medicamento  administrado por um profissional da sade no hospital ou em consultrio. Fale com seu pediatra a respeito do uso deste medicamento em crianas. Pode ser preciso tomar alguns cuidados especiais. Superdosagem: Se achar que tomou uma superdosagem deste medicamento, entre em contato imediatamente com o [facility] ou v a [employer]. OBSERVAO: Este medicamento  s para voc. No compartilhe este medicamento com outras pessoas. E se eu deixar de tomar uma dose?  importante que voc no perca nenhuma dose. Se no puder comparecer a uma consulta, entre em contato com seu mdico ou profissional de sade. O que pode interagir com este medicamento? No tome este medicamento com nenhum dos seguintes: -deferoxamina -dimercaprol -outros produtos  base de ferro Este medicamento tambm pode interagir com os  seguintes remdios: -cloranfenicol -deferasirox Esta lista pode no descrever todas as interaes possveis. D ao seu profissional de sade uma lista de todos os medicamentos, ervas medicinais, remdios de venda livre, ou suplementos alimentares que voc [place]. Diga tambm se voc fuma, bebe, ou [place] drogas ilcitas. Alguns destes podem interagir com o seu medicamento. Ao que devo ficar atento quando estiver [employer] medicamento? Consulte seu mdico ou profissional de sade para acompanhamento regular Museum/gallery curator. Avise seu mdico ou profissional de sade se os seus sintomas no melhorarem ou se piorarem. Voc precisar fazer exames de sangue peridicos enquanto estiver [employer]. Voc pode precisar seguir uma dieta especial. Fale com seu mdico. Alguns alimentos que contm ferro: gros e cereais integrais, frutas secas, feijo, ervilha, hortalias e midos (fgado, rim). Que efeitos colaterais posso sentir aps usar este medicamento? Efeitos colaterais que devem ser informados ao seu mdico ou profissional de sade o mais rpido possvel: -reaes alrgicas, como erupo na pele, coceira, urticria, ou inchao do rosto, dos lbios ou da lngua -dificuldade para respirar -alteraes na presso arterial -sensao de tontura, desmaio, quedas -rubor, suores ou sensao de calor Efeitos colaterais que normalmente no precisam de cuidados mdicos (avise ao seu mdico ou profissional de sade se persistirem ou forem incmodos): -alterao no paladar -priso de ventre -tontura -dor de cabea -enjoo -dor, vermelhido, coceira ou irritao no local da injeo -vmitos Esta lista pode no descrever todos os efeitos colaterais possveis. Para mais orientaes sobre efeitos colaterais, consulte o seu mdico. Voc pode relatar a ocorrncia de efeitos colaterais  FDA pelo telefone [phone]. Onde devo guardar meu medicamento? Este medicamento  administrado por um profissional da  sade no hospital ou em consultrio. Voc no receber este medicamento para conservao  em casa. OBSERVAO: Este folheto  um resumo. Pode no cobrir todas as informaes possveis. Se tiver dvidas a respeito deste medicamento, fale com seu mdico, farmacutico ou profissional de sade.  2018 Elsevier/Gold Standard (2016-08-06 00:00:00)

## 2017-10-20 NOTE — Progress Notes (Signed)
PATIENT CARE CENTER NOTE  Diagnosis: Iron Deficiency Anemia    Provider: Dr. Irene Limbo   Procedure: IV Injectafer    Note: Patient received infusion of IV iron (Injectafer). Patient tolerated infusion well with no adverse reaction. Patient monitored for 30 minutes after infusion. Vitals taken and stable. Discharge information given to patient. Patient alert, oriented and ambulatory at discharge.

## 2017-11-10 ENCOUNTER — Telehealth: Payer: Self-pay | Admitting: Internal Medicine

## 2017-11-10 DIAGNOSIS — F5101 Primary insomnia: Secondary | ICD-10-CM

## 2017-11-10 MED ORDER — TRAZODONE HCL 50 MG PO TABS: 25.0000 mg | ORAL_TABLET | Freq: Every evening | ORAL | 5 refills | Status: DC | PRN

## 2017-11-10 NOTE — Telephone Encounter (Signed)
Pt called to request a refill for traZODone (DESYREL) 50 MG tablet Please sent it to [id number] - [place], [address] Please follow up

## 2017-11-10 NOTE — Telephone Encounter (Signed)
Will forward to pcp

## 2017-11-10 NOTE — Telephone Encounter (Signed)
RF on Trazodone sent to [facility].

## 2017-11-18 NOTE — Progress Notes (Signed)
HEMATOLOGY/ONCOLOGY CLINIC NOTE  Date of Service: 11/19/17  Patient Care Team: Ladell Pier, MD as PCP - General (Internal Medicine)  CHIEF COMPLAINTS/PURPOSE OF CONSULTATION:  F/u for Anemia  HISTORY OF PRESENTING ILLNESS:   Jodi [name] is a wonderful 72 y.o. female who has been referred to [place] by Dr Karle Plumber for evaluation and management of anemia. She is accompanied today by a Patent attorney.   She notes that she first started feeling more tired around 3 weeks ago. The pt reports that last week she fell asleep while driving and has felt very tired, weak and dizzy. She notes that she moved from [place] in 2012 and she received a transfusion in 2012 with IV iron as well. She notes that she had a hiatal hernia and ulcers shown by an endoscopy last year 07/30/16. She notes that she had clear stools and was not told that there was any GI bleeding. She notes that she has had ice cravings, has lost some hair recently, and has had brittle finger nails.  She notes that she has been B12 deficient in the past but has not taken any PO replacements or received any injections; and she denies having any dietary restrictions.   She notes that over the last 4 months she has lost about 10 lbs, not intentionally. She notes that her appetite has decreased. She notes that she takes Ibuprofen prn for pain related to her osteoporosis and also takes Fosamax weekly, for the last 6 weeks.   She takes Dexlansoprazole for acid suppression. She has taken Ferrous sulfate for the last two weeks.  Most recent lab results (09/28/17) of CBC  is as follows: all values are WNL except for RBC at 3.02, Hgb at 5.6, HCT at 20.1, MCV at 67, MCH at 18.5, MCHC at 27.9, RDW at 21.3, Platelets at 515k. Vitamin B12 09/28/17 is WNL at 457. Vitamin D 25 Hydroxy 09/28/17 is at 29.7.   On review of systems, pt reports dizziness, drowsiness, lightheadedness, and denies blood in the stools, black stools, abdominal  pains, changes in bowel habits, blood in the urine, nose bleeds, gum bleeds, bleeding, pain, and any other symptoms.   On PMHx the pt reports hypertension, B12 deficiency, diabetes. She has had a C section in the past. She denies heart, lung, and liver problems On Social Hx the pt denies any ETOH and smoking consumption.  On Family Hx the pt denies any family history of blood disorders.  Interval History:  Jodi [name] returns today regarding her iron deficiency anemia. The patient's last visit with [place] was on 10/14/17. A spanish translator is on the phone. The pt reports that she is doing very well.   The pt reports that she has felt much better. She notes that she is eating better. She will be following up with GI on 11/24/17. She notes that she has no pain when she eats.   Of note since the patient's last visit, pt has had a blood transfusion completed on 10/16/17.  Lab results today (11/19/17) of CBC is as follows: all values are WNL except for MCHC at 30.6, RDW at 26.8. hgb is improved to 11.9 Ferritin 11/19/17 is 309 Iron/TIBC 11/19/17 is 45%  On review of systems, pt reports good energy levels, increased appetite, and denies blood in the stools, black stools, bleeding, fatigue and any other symptoms.    MEDICAL HISTORY:  Past Medical History:  Diagnosis Date  . Anemia   . Diabetes mellitus  without complication ([place])   . Hypertension   . Insomnia     SURGICAL HISTORY: Past Surgical History:  Procedure Laterality Date  . CESAREAN SECTION    . SHOULDER SURGERY Left 2010    SOCIAL HISTORY: Social History   Socioeconomic History  . Marital status: Single    Spouse name: Not on file  . Number of children: Not on file  . Years of education: Not on file  . Highest education level: Not on file  Occupational History  . Not on file  Social Needs  . Financial resource strain: Not on file  . Food insecurity:    Worry: Not on file    Inability: Not on file  . Transportation  needs:    Medical: Not on file    Non-medical: Not on file  Tobacco Use  . Smoking status: Never Smoker  . Smokeless tobacco: Never Used  Substance and Sexual Activity  . Alcohol use: No  . Drug use: No  . Sexual activity: Not on file  Lifestyle  . Physical activity:    Days per week: Not on file    Minutes per session: Not on file  . Stress: Not on file  Relationships  . Social connections:    Talks on phone: Not on file    Gets together: Not on file    Attends religious service: Not on file    Active member of club or organization: Not on file    Attends meetings of clubs or organizations: Not on file    Relationship status: Not on file  . Intimate partner violence:    Fear of current or ex partner: Not on file    Emotionally abused: Not on file    Physically abused: Not on file    Forced sexual activity: Not on file  Other Topics Concern  . Not on file  Social History Narrative  . Not on file    FAMILY HISTORY: History reviewed. No pertinent family history.  ALLERGIES:  is allergic to cortisone; gabapentin; pork-derived products; and shellfish allergy.  MEDICATIONS:  Current Outpatient Medications  Medication Sig Dispense Refill  . ACCU-CHEK SOFTCLIX LANCETS lancets Use as instructed to test blood glucose twice daily. 100 each 12  . alendronate (FOSAMAX) 70 MG tablet Take 1 tablet (70 mg total) by mouth every 7 (seven) days. Take with a full glass of water on an empty stomach. 4 tablet 11  . amLODipine (NORVASC) 5 MG tablet Take 0.5 tablets (2.5 mg total) by mouth daily. 15 tablet 6  . Blood Glucose Monitoring Suppl (ACCU-CHEK AVIVA PLUS) w/Device KIT To test blood glucose twice daily. 1 kit 0  . CALCIUM PO Take 1 tablet by mouth daily.    Marland Kitchen dexlansoprazole (DEXILANT) 60 MG capsule Take 1 capsule a day 30 capsule 0  . diclofenac sodium (VOLTAREN) 1 % GEL Apply 2 g topically 4 (four) times daily. (Patient taking differently: Apply 2 g topically 4 (four) times daily  as needed (pain). ) 100 g 3  . Elastic Bandages & Supports (LUMBAR BACK BRACE/SUPPORT PAD) MISC 1 application by Does not apply route daily. 1 each 0  . glucose blood (ACCU-CHEK AVIVA PLUS) test strip Use as instructed to test blood glucose 2 times daily. 100 each 12  . Iron-FA-B Cmp-C-Biot-Probiotic (FUSION PLUS) CAPS Take 1 tablet by mouth every other day. As tolerated 90 capsule 2  . Lancet Devices (ACCU-CHEK SOFTCLIX) lancets Use as instructed to test blood glucose twice daily. 1 each  0  . metFORMIN (GLUCOPHAGE) 500 MG tablet Take 1 tablet (500 mg total) by mouth daily with breakfast. 90 tablet 0  . polyethylene glycol powder (GLYCOLAX/MIRALAX) powder Take 17 g by mouth 2 (two) times daily as needed. (Patient taking differently: Take 17 g by mouth 2 (two) times daily as needed (constipation). ) 3350 g 1  . senna-docusate (SENOKOT-S) 8.6-50 MG tablet Take 1 tablet by mouth at bedtime as needed for mild constipation. 120 tablet 3  . solifenacin (VESICARE) 10 MG tablet Take 1 tablet (10 mg total) by mouth daily. 30 tablet 3  . traZODone (DESYREL) 50 MG tablet Take 0.5 tablets (25 mg total) by mouth at bedtime as needed for sleep. 30 tablet 5  . Vitamin D, Cholecalciferol, 400 units CHEW Chew 2 tablets (800 Units total) by mouth daily. 120 tablet 6   No current facility-administered medications for this visit.     REVIEW OF SYSTEMS:   .10 Point review of Systems was done is negative except as noted above.   PHYSICAL EXAMINATION:  . Vitals:   11/19/17 0914  BP: 139/89  Pulse: 72  Resp: 18  Temp: 98.3 F (36.8 C)  SpO2: 97%   Filed Weights   11/19/17 0914  Weight: 143 lb 1.6 oz (64.9 kg)   .Body mass index is 27.95 kg/m. Marland Kitchen GENERAL:alert, in no acute distress and comfortable SKIN: no acute rashes, no significant lesions EYES: conjunctiva are pink and non-injected, sclera anicteric OROPHARYNX: MMM, no exudates, no oropharyngeal erythema or ulceration NECK: supple, no JVD LYMPH:   no palpable lymphadenopathy in the cervical, axillary or inguinal regions LUNGS: clear to auscultation b/l with normal respiratory effort HEART: regular rate & rhythm ABDOMEN:  normoactive bowel sounds , non tender, not distended. Extremity: no pedal edema PSYCH: alert & oriented x 3 with fluent speech NEURO: no focal motor/sensory deficits   LABORATORY DATA:  I have reviewed the data as listed  . CBC Latest Ref Rng & Units 11/19/2017 10/15/2017 10/15/2017  WBC 3.9 - 10.3 K/uL 4.9 5.1 -  Hemoglobin 11.6 - 15.9 g/dL 11.9 10.2(L) -  Hematocrit 34.8 - 46.6 % 38.9 34.1(L) 34.1  Platelets 145 - 400 K/uL 276 466(H) -    . CMP Latest Ref Rng & Units 10/15/2017 10/14/2017 10/14/2017  Glucose 65 - 99 mg/dL 104(H) 104(H) 100  BUN 6 - 20 mg/dL 13 16 15   Creatinine 0.44 - 1.00 mg/dL 0.59 0.59 0.72  Sodium 135 - 145 mmol/L 144 139 139  Potassium 3.5 - 5.1 mmol/L 4.0 4.3 4.3  Chloride 101 - 111 mmol/L 110 107 107  CO2 22 - 32 mmol/L 26 25 23   Calcium 8.9 - 10.3 mg/dL 9.1 8.9 9.5  Total Protein 6.5 - 8.1 g/dL 6.7 6.9 8.2  Total Bilirubin 0.3 - 1.2 mg/dL 2.0(H) 0.4 0.4  Alkaline Phos 38 - 126 U/L 74 69 80  AST 15 - 41 U/L 24 27 25   ALT 14 - 54 U/L 19 20 22    Component     Latest Ref Rng & Units 10/14/2017 10/15/2017  Iron     41 - 142 ug/dL 14 (L)   TIBC     236 - 444 ug/dL 429   Saturation Ratios     21 - 57 % 3 (L)   UIBC     ug/dL 415   Retic Ct Pct     0.4 - 3.1 % 5.0 (H)   RBC.     3.87 - 5.11 MIL/uL 3.58 (L)  Retic Count, Absolute     19.0 - 186.0 K/uL 179.0   Ferritin     9 - 269 ng/mL 10   Vitamin B12     180 - 914 pg/mL 421 353  Parietal Cell Antibody-IgG     0.0 - 20.0 Units 1.5   Intrinsic Factor     0.0 - 1.1 AU/mL 1.0   Magnesium     1.7 - 2.4 mg/dL  2.4  Phosphorus     2.5 - 4.6 mg/dL  3.1   . Lab Results  Component Value Date   IRON 115 11/19/2017   TIBC 253 11/19/2017   IRONPCTSAT 45 11/19/2017   (Iron and TIBC)  Lab Results  Component Value Date    FERRITIN 309 (H) 11/19/2017     RADIOGRAPHIC STUDIES: I have personally reviewed the radiological images as listed and agreed with the findings in the report. No results found.  ASSESSMENT & PLAN:  72 y.o. female   1. Severe microcytic Anemia due to Iron deficiency anemia - now resolved 2. Severe Iron deficiency due to chronic GI bleeding - corrected 3. Symptomatic anemia - with dizziness, pre syncopal symptoms.-resolved PLAN-   -Discussed pt labwork today, 11/19/17; anemia resolved with Hgb at 11.9  (up from 6.6) -ferritin is adequate currently >100-- no indication for additional IV Iron at this time. -No ibuprofen, NSAIDs or Fosamax -Will see pt back in 4 months to recheck labs unless new concerns or symptoms develop.  -Reviewed previous endoscopy findings of hiatal hernia and two ulcers in the stomach.   RTC with Dr Irene Limbo in 4 months with labs   All of the patients questions were answered with apparent satisfaction. The patient knows to call the clinic with any problems, questions or concerns.  . The total time spent in the appointment was 15 minutes and more than 50% was on counseling and direct patient cares.      Sullivan Lone MD [place] AAHIVMS [facility] Hematology/Oncology Physician [facility]  (Office):       [phone] (Work cell):  [phone] (Fax):           [phone]  11/19/2017 9:41 AM  This document serves as a record of services personally performed by Sullivan Lone, MD. It was created on his behalf by Baldwin [place], a trained medical scribe. The creation of this record is based on the scribe's personal observations and the provider's statements to them.   .I have reviewed the above documentation for accuracy and completeness, and I agree with the above. Brunetta Genera MD MS

## 2017-11-19 ENCOUNTER — Other Ambulatory Visit: Payer: Self-pay

## 2017-11-19 ENCOUNTER — Encounter: Payer: Self-pay | Admitting: Hematology

## 2017-11-19 ENCOUNTER — Inpatient Hospital Stay: Payer: Medicare Other

## 2017-11-19 ENCOUNTER — Telehealth: Payer: Self-pay | Admitting: Hematology

## 2017-11-19 ENCOUNTER — Inpatient Hospital Stay: Payer: Medicare Other | Attending: Hematology | Admitting: Hematology

## 2017-11-19 VITALS — BP 139/89 | HR 72 | Temp 98.3°F | Resp 18 | Ht 60.0 in | Wt 143.1 lb

## 2017-11-19 DIAGNOSIS — I10 Essential (primary) hypertension: Secondary | ICD-10-CM | POA: Diagnosis not present

## 2017-11-19 DIAGNOSIS — R531 Weakness: Secondary | ICD-10-CM

## 2017-11-19 DIAGNOSIS — Z79899 Other long term (current) drug therapy: Secondary | ICD-10-CM

## 2017-11-19 DIAGNOSIS — K449 Diaphragmatic hernia without obstruction or gangrene: Secondary | ICD-10-CM | POA: Diagnosis not present

## 2017-11-19 DIAGNOSIS — G4700 Insomnia, unspecified: Secondary | ICD-10-CM | POA: Diagnosis not present

## 2017-11-19 DIAGNOSIS — R5383 Other fatigue: Secondary | ICD-10-CM | POA: Diagnosis not present

## 2017-11-19 DIAGNOSIS — E119 Type 2 diabetes mellitus without complications: Secondary | ICD-10-CM | POA: Diagnosis not present

## 2017-11-19 DIAGNOSIS — R55 Syncope and collapse: Secondary | ICD-10-CM | POA: Diagnosis not present

## 2017-11-19 DIAGNOSIS — Z7984 Long term (current) use of oral hypoglycemic drugs: Secondary | ICD-10-CM | POA: Diagnosis not present

## 2017-11-19 DIAGNOSIS — D500 Iron deficiency anemia secondary to blood loss (chronic): Secondary | ICD-10-CM

## 2017-11-19 DIAGNOSIS — R42 Dizziness and giddiness: Secondary | ICD-10-CM | POA: Diagnosis not present

## 2017-11-19 DIAGNOSIS — E538 Deficiency of other specified B group vitamins: Secondary | ICD-10-CM | POA: Diagnosis not present

## 2017-11-19 DIAGNOSIS — D509 Iron deficiency anemia, unspecified: Secondary | ICD-10-CM | POA: Insufficient documentation

## 2017-11-19 LAB — IRON AND TIBC
Iron: 115 ug/dL (ref 41–142)
Saturation Ratios: 45 % (ref 21–57)
TIBC: 253 ug/dL (ref 236–444)
UIBC: 138 ug/dL

## 2017-11-19 LAB — CMP (CANCER CENTER ONLY)
ALT: 15 U/L (ref 0–55)
AST: 21 U/L (ref 5–34)
Albumin: 3.8 g/dL (ref 3.5–5.0)
Alkaline Phosphatase: 80 U/L (ref 40–150)
Anion gap: 5 (ref 3–11)
BUN: 17 mg/dL (ref 7–26)
CHLORIDE: 109 mmol/L (ref 98–109)
CO2: 26 mmol/L (ref 22–29)
Calcium: 9.2 mg/dL (ref 8.4–10.4)
Creatinine: 0.69 mg/dL (ref 0.60–1.10)
GFR, Est AFR Am: >60 mL/min (ref >60)
GFR, Estimated: >60 mL/min (ref >60)
Glucose, Bld: 102 mg/dL (ref 70–140)
POTASSIUM: 4.7 mmol/L (ref 3.5–5.1)
SODIUM: 140 mmol/L (ref 136–145)
Total Bilirubin: 0.4 mg/dL (ref 0.2–1.2)
Total Protein: 7.6 g/dL (ref 6.4–8.3)

## 2017-11-19 LAB — CBC WITH DIFFERENTIAL (CANCER CENTER ONLY)
Basophils Absolute: 0.1 10*3/uL (ref 0.0–0.1)
Basophils Relative: 1 %
EOS PCT: 3 %
Eosinophils Absolute: 0.2 10*3/uL (ref 0.0–0.5)
HEMATOCRIT: 38.9 % (ref 34.8–46.6)
Hemoglobin: 11.9 g/dL (ref 11.6–15.9)
LYMPHS ABS: 1.6 10*3/uL (ref 0.9–3.3)
LYMPHS PCT: 32 %
MCH: 26.2 pg (ref 25.1–34.0)
MCHC: 30.6 g/dL — ABNORMAL LOW (ref 31.5–36.0)
MCV: 85.5 fL (ref 79.5–101.0)
MONO ABS: 0.4 10*3/uL (ref 0.1–0.9)
Monocytes Relative: 9 %
NEUTROS ABS: 2.7 10*3/uL (ref 1.5–6.5)
Neutrophils Relative %: 55 %
PLATELETS: 276 10*3/uL (ref 145–400)
RBC: 4.55 MIL/uL (ref 3.70–5.45)
RDW: 26.8 % — AB (ref 11.2–14.5)
WBC Count: 4.9 10*3/uL (ref 3.9–10.3)

## 2017-11-19 LAB — SAMPLE TO BLOOD BANK

## 2017-11-19 LAB — FERRITIN: Ferritin: 309 ng/mL — ABNORMAL HIGH (ref 9–269)

## 2017-11-19 NOTE — Telephone Encounter (Signed)
Scheduled appt per 5/3 los -Gave patient aVS and calender per los.  

## 2017-11-24 ENCOUNTER — Encounter: Payer: Self-pay | Admitting: Internal Medicine

## 2017-11-24 ENCOUNTER — Ambulatory Visit: Payer: Medicare Other | Attending: Internal Medicine | Admitting: Internal Medicine

## 2017-11-24 VITALS — BP 148/91 | HR 79 | Temp 97.9°F | Resp 16 | Wt 142.6 lb

## 2017-11-24 DIAGNOSIS — K219 Gastro-esophageal reflux disease without esophagitis: Secondary | ICD-10-CM | POA: Insufficient documentation

## 2017-11-24 DIAGNOSIS — Z7983 Long term (current) use of bisphosphonates: Secondary | ICD-10-CM | POA: Diagnosis not present

## 2017-11-24 DIAGNOSIS — E611 Iron deficiency: Secondary | ICD-10-CM

## 2017-11-24 DIAGNOSIS — Z79899 Other long term (current) drug therapy: Secondary | ICD-10-CM | POA: Diagnosis not present

## 2017-11-24 DIAGNOSIS — Z91018 Allergy to other foods: Secondary | ICD-10-CM | POA: Diagnosis not present

## 2017-11-24 DIAGNOSIS — I10 Essential (primary) hypertension: Secondary | ICD-10-CM | POA: Diagnosis not present

## 2017-11-24 DIAGNOSIS — Z7984 Long term (current) use of oral hypoglycemic drugs: Secondary | ICD-10-CM | POA: Insufficient documentation

## 2017-11-24 DIAGNOSIS — E559 Vitamin D deficiency, unspecified: Secondary | ICD-10-CM | POA: Diagnosis not present

## 2017-11-24 DIAGNOSIS — Z888 Allergy status to other drugs, medicaments and biological substances status: Secondary | ICD-10-CM | POA: Insufficient documentation

## 2017-11-24 DIAGNOSIS — K449 Diaphragmatic hernia without obstruction or gangrene: Secondary | ICD-10-CM | POA: Diagnosis not present

## 2017-11-24 DIAGNOSIS — E119 Type 2 diabetes mellitus without complications: Secondary | ICD-10-CM

## 2017-11-24 DIAGNOSIS — D509 Iron deficiency anemia, unspecified: Secondary | ICD-10-CM | POA: Diagnosis not present

## 2017-11-24 DIAGNOSIS — M810 Age-related osteoporosis without current pathological fracture: Secondary | ICD-10-CM | POA: Diagnosis not present

## 2017-11-24 DIAGNOSIS — Z91013 Allergy to seafood: Secondary | ICD-10-CM | POA: Insufficient documentation

## 2017-11-24 LAB — GLUCOSE, POCT (MANUAL RESULT ENTRY): POC Glucose: 110 mg/dl — AB (ref 70–99)

## 2017-11-24 NOTE — Progress Notes (Signed)
Patient ID: JUNKO OHAGAN, female    DOB: 06-11-1946  MRN: [id number]  CC: Hypertension   Subjective: Jodi [name] is a 72 y.o. female who presents for hosp f/u Her concerns today include:  72 yr old with hx of GERD, osteoporosis, Vit D def, diverticulosis, iron def, HTN and pre-DM.  Pt hosp with symptomatic anemia. Transfused 2 units PRBC and iron transfusion Seen by Dr. Irene Limbo in [place].  He recommends that she stop all NSAID and Fosamax.  Pt still taking Fosamax, was on discgh summary to continue taking Also seen by [facility] Dr Paulita Fujita.  No repeat GI eval needed at this time.  Recommended iron +PPI -pt feeling better.   She has seen Dr. Irene Limbo in f/u. Last Hb was 11.  Has a f/u with Dr. Paulita Fujita today   Blood pressure noted to be elevated today.  Patient states that she has not taken amlodipine as yet for the morning. Patient Active Problem List   Diagnosis Date Noted  . Symptomatic anemia   . Iron deficiency anemia due to chronic blood loss 10/14/2017  . Anemia 10/14/2017  . Diabetes mellitus without complication ([place]) 54/98/2641  . HTN (hypertension), benign 03/16/2017  . Primary insomnia 03/16/2017  . Osteoporosis 01/08/2017  . GERD (gastroesophageal reflux disease) 01/08/2017  . Vitamin D deficiency 01/08/2017  . Edema 03/13/2014     Current Outpatient Medications on File Prior to Visit  Medication Sig Dispense Refill  . ACCU-CHEK SOFTCLIX LANCETS lancets Use as instructed to test blood glucose twice daily. 100 each 12  . alendronate (FOSAMAX) 70 MG tablet Take 1 tablet (70 mg total) by mouth every 7 (seven) days. Take with a full glass of water on an empty stomach. 4 tablet 11  . amLODipine (NORVASC) 5 MG tablet Take 0.5 tablets (2.5 mg total) by mouth daily. 15 tablet 6  . Blood Glucose Monitoring Suppl (ACCU-CHEK AVIVA PLUS) w/Device KIT To test blood glucose twice daily. 1 kit 0  . CALCIUM PO Take 1 tablet by mouth daily.    Marland Kitchen dexlansoprazole (DEXILANT) 60 MG  capsule Take 1 capsule a day 30 capsule 0  . diclofenac sodium (VOLTAREN) 1 % GEL Apply 2 g topically 4 (four) times daily. (Patient taking differently: Apply 2 g topically 4 (four) times daily as needed (pain). ) 100 g 3  . Elastic Bandages & Supports (LUMBAR BACK BRACE/SUPPORT PAD) MISC 1 application by Does not apply route daily. 1 each 0  . glucose blood (ACCU-CHEK AVIVA PLUS) test strip Use as instructed to test blood glucose 2 times daily. 100 each 12  . Iron-FA-B Cmp-C-Biot-Probiotic (FUSION PLUS) CAPS Take 1 tablet by mouth every other day. As tolerated 90 capsule 2  . Lancet Devices (ACCU-CHEK SOFTCLIX) lancets Use as instructed to test blood glucose twice daily. 1 each 0  . metFORMIN (GLUCOPHAGE) 500 MG tablet Take 1 tablet (500 mg total) by mouth daily with breakfast. 90 tablet 0  . polyethylene glycol powder (GLYCOLAX/MIRALAX) powder Take 17 g by mouth 2 (two) times daily as needed. (Patient taking differently: Take 17 g by mouth 2 (two) times daily as needed (constipation). ) 3350 g 1  . senna-docusate (SENOKOT-S) 8.6-50 MG tablet Take 1 tablet by mouth at bedtime as needed for mild constipation. 120 tablet 3  . solifenacin (VESICARE) 10 MG tablet Take 1 tablet (10 mg total) by mouth daily. 30 tablet 3  . traZODone (DESYREL) 50 MG tablet Take 0.5 tablets (25 mg total) by mouth at  bedtime as needed for sleep. 30 tablet 5  . Vitamin D, Cholecalciferol, 400 units CHEW Chew 2 tablets (800 Units total) by mouth daily. 120 tablet 6   No current facility-administered medications on file prior to visit.     Allergies  Allergen Reactions  . Cortisone     Rash, swelling  . Gabapentin Nausea Only    With dizziness  . Pork-Derived Products     Rash, joint pain  . Shellfish Allergy Rash and Other (See Comments)    joint pain, GI pain    Social History   Socioeconomic History  . Marital status: Single    Spouse name: Not on file  . Number of children: Not on file  . Years of  education: Not on file  . Highest education level: Not on file  Occupational History  . Not on file  Social Needs  . Financial resource strain: Not on file  . Food insecurity:    Worry: Not on file    Inability: Not on file  . Transportation needs:    Medical: Not on file    Non-medical: Not on file  Tobacco Use  . Smoking status: Never Smoker  . Smokeless tobacco: Never Used  Substance and Sexual Activity  . Alcohol use: No  . Drug use: No  . Sexual activity: Not on file  Lifestyle  . Physical activity:    Days per week: Not on file    Minutes per session: Not on file  . Stress: Not on file  Relationships  . Social connections:    Talks on phone: Not on file    Gets together: Not on file    Attends religious service: Not on file    Active member of club or organization: Not on file    Attends meetings of clubs or organizations: Not on file    Relationship status: Not on file  . Intimate partner violence:    Fear of current or ex partner: Not on file    Emotionally abused: Not on file    Physically abused: Not on file    Forced sexual activity: Not on file  Other Topics Concern  . Not on file  Social History Narrative  . Not on file    No family history on file.  Past Surgical History:  Procedure Laterality Date  . CESAREAN SECTION    . SHOULDER SURGERY Left 2010    ROS: Review of Systems Except as stated above PHYSICAL EXAM: BP (!) 148/91   Pulse 79   Temp 97.9 F (36.6 C) (Oral)   Resp 16   Wt 142 lb 9.6 oz (64.7 kg)   SpO2 95%   BMI 27.85 kg/m   Physical Exam  General appearance - alert, well appearing, and in no distress Mental status - alert, oriented to person, place, and time, normal mood, behavior, speech, dress, motor activity, and thought processes Chest - clear to auscultation, no wheezes, rales or rhonchi, symmetric air entry Heart - normal rate, regular rhythm, normal S1, S2, no murmurs, rubs, clicks or gallops Extremities -  peripheral pulses normal, no pedal edema, no clubbing or cyanosis   Results for orders placed or performed in visit on 11/24/17  POCT glucose (manual entry)  Result Value Ref Range   POC Glucose 110 (A) 70 - 99 mg/dl   Lab Results  Component Value Date   HGBA1C 5.5 09/28/2017   Lab Results  Component Value Date   WBC 4.9 11/19/2017  HGB 11.9 11/19/2017   HCT 38.9 11/19/2017   MCV 85.5 11/19/2017   PLT 276 11/19/2017   Lab Results  Component Value Date   IRON 115 11/19/2017   TIBC 253 11/19/2017   FERRITIN 309 (H) 11/19/2017     ASSESSMENT AND PLAN: 1. Iron deficiency Hemoglobin has improved.  Patient is feeling better.  She is being followed by hematology and gastroenterology.  I have asked her to ask her gastroenterologist home she will see today whether we need to stop the Fosamax or not.  2. Diabetes mellitus without complication ([place]) This was not addressed today. - POCT glucose (manual entry) - Microalbumin / creatinine urine ratio  Advised patient to take blood pressure medicine as soon as she returns home. Patient was given the opportunity to ask questions.  Patient verbalized understanding of the plan and was able to repeat key elements of the plan.  [employer] interpreter used during this encounter.   Orders Placed This Encounter  Procedures  . Microalbumin / creatinine urine ratio  . POCT glucose (manual entry)     Requested Prescriptions    No prescriptions requested or ordered in this encounter    No follow-ups on file.  Karle Plumber, MD, FACP

## 2017-11-24 NOTE — Progress Notes (Signed)
Pt states she has been having body aches

## 2017-11-25 LAB — MICROALBUMIN / CREATININE URINE RATIO
Creatinine, Urine: 16.2 mg/dL
Microalbumin, Urine: <3 ug/mL

## 2017-11-26 ENCOUNTER — Telehealth: Payer: Self-pay

## 2017-11-26 NOTE — Telephone Encounter (Signed)
CMA call patient regarding lab results  Patient verify DOB  Patient was aware and understood    

## 2017-11-26 NOTE — Telephone Encounter (Signed)
-----   Message from Jackelyn Knife, [place] sent at 11/26/2017  2:24 PM EDT -----   ----- Message ----- From: Ladell Pier, MD Sent: 11/26/2017   7:45 AM To: Jackelyn Knife, RMA  Urine did not reveal any significant amount of protein.  This is good.

## 2017-12-07 ENCOUNTER — Other Ambulatory Visit: Payer: Self-pay

## 2017-12-07 DIAGNOSIS — D509 Iron deficiency anemia, unspecified: Secondary | ICD-10-CM | POA: Diagnosis not present

## 2017-12-07 DIAGNOSIS — K449 Diaphragmatic hernia without obstruction or gangrene: Secondary | ICD-10-CM | POA: Diagnosis not present

## 2017-12-07 DIAGNOSIS — F5101 Primary insomnia: Secondary | ICD-10-CM

## 2017-12-07 MED ORDER — TRAZODONE HCL 50 MG PO TABS: 25.0000 mg | ORAL_TABLET | Freq: Every evening | ORAL | 1 refills | Status: DC | PRN

## 2017-12-22 ENCOUNTER — Encounter: Payer: Self-pay | Admitting: Internal Medicine

## 2017-12-22 NOTE — Progress Notes (Signed)
EGD done by Dr. Penelope Coop of [facility] 12/07/2017. Normal except large hiatal hernia.

## 2017-12-27 ENCOUNTER — Other Ambulatory Visit: Payer: Self-pay

## 2017-12-27 ENCOUNTER — Telehealth: Payer: Self-pay | Admitting: Internal Medicine

## 2017-12-27 DIAGNOSIS — D509 Iron deficiency anemia, unspecified: Secondary | ICD-10-CM | POA: Diagnosis not present

## 2017-12-27 MED ORDER — METFORMIN HCL 500 MG PO TABS: 500.0000 mg | ORAL_TABLET | Freq: Every day | ORAL | 1 refills | Status: DC

## 2017-12-27 MED ORDER — SOLIFENACIN SUCCINATE 10 MG PO TABS: 10.0000 mg | ORAL_TABLET | Freq: Every day | ORAL | 3 refills | Status: DC

## 2017-12-27 NOTE — Telephone Encounter (Signed)
[facility] called for metFORMIN (GLUCOPHAGE) 500 MG tablet [[id number]] and solifenacin (VESICARE) 10 MG tablet [[id number]]

## 2017-12-27 NOTE — Telephone Encounter (Signed)
rx's has been sent

## 2018-01-06 ENCOUNTER — Other Ambulatory Visit: Payer: Self-pay | Admitting: Family Medicine

## 2018-01-06 DIAGNOSIS — M810 Age-related osteoporosis without current pathological fracture: Secondary | ICD-10-CM

## 2018-01-13 DIAGNOSIS — D509 Iron deficiency anemia, unspecified: Secondary | ICD-10-CM | POA: Diagnosis not present

## 2018-01-28 ENCOUNTER — Ambulatory Visit: Payer: Medicare Other | Admitting: Internal Medicine

## 2018-03-24 NOTE — Progress Notes (Signed)
HEMATOLOGY/ONCOLOGY CLINIC NOTE  Date of Service: 03/25/18    Patient Care Team: Ladell Pier, MD as PCP - General (Internal Medicine)  CHIEF COMPLAINTS/PURPOSE OF CONSULTATION:  F/u for Anemia  HISTORY OF PRESENTING ILLNESS:   Jodi [name] is a wonderful 72 y.o. female who has been referred to [place] by Dr Karle Plumber for evaluation and management of anemia. She is accompanied today by a Patent attorney.   She notes that she first started feeling more tired around 3 weeks ago. The pt reports that last week she fell asleep while driving and has felt very tired, weak and dizzy. She notes that she moved from [place] in 2012 and she received a transfusion in 2012 with IV iron as well. She notes that she had a hiatal hernia and ulcers shown by an endoscopy last year 07/30/16. She notes that she had clear stools and was not told that there was any GI bleeding. She notes that she has had ice cravings, has lost some hair recently, and has had brittle finger nails.  She notes that she has been B12 deficient in the past but has not taken any PO replacements or received any injections; and she denies having any dietary restrictions.   She notes that over the last 4 months she has lost about 10 lbs, not intentionally. She notes that her appetite has decreased. She notes that she takes Ibuprofen prn for pain related to her osteoporosis and also takes Fosamax weekly, for the last 6 weeks.   She takes Dexlansoprazole for acid suppression. She has taken Ferrous sulfate for the last two weeks.  Most recent lab results (09/28/17) of CBC  is as follows: all values are WNL except for RBC at 3.02, Hgb at 5.6, HCT at 20.1, MCV at 67, MCH at 18.5, MCHC at 27.9, RDW at 21.3, Platelets at 515k. Vitamin B12 09/28/17 is WNL at 457. Vitamin D 25 Hydroxy 09/28/17 is at 29.7.   On review of systems, pt reports dizziness, drowsiness, lightheadedness, and denies blood in the stools, black stools,  abdominal pains, changes in bowel habits, blood in the urine, nose bleeds, gum bleeds, bleeding, pain, and any other symptoms.   On PMHx the pt reports hypertension, B12 deficiency, diabetes. She has had a C section in the past. She denies heart, lung, and liver problems On Social Hx the pt denies any ETOH and smoking consumption.  On Family Hx the pt denies any family history of blood disorders.  Interval History:  Jodi [name] returns today regarding her iron deficiency anemia. The patient's last visit with [place] was on 11/19/17. She is accompanied today by a Patent attorney. The pt reports that she is doing well overall.   The pt reports that she has been feeling much better and has continued following up with GI and had a capsule endoscopy in July 2019 which was normal. She has been taking one pill of Iron Fusion Plus each day. She has not been taking Vitamin B12 replacement though she is taking acid suppressant and metformin.   Lab results today (03/25/18) of CBC w/diff, CMP, and Reticulocytes is as follows: all values are WNL except for Glucose at 108, Retic ct pct at 2.3%, Retic ct abs at 95.5k. 03/25/18 Ferritin is WNL at 135 03/25/18 Iron/TIBC shows all values WNL 03/25/18 Vitamin B12 is 377  On review of systems, pt reports improved energy levels, and denies blood in the stools, black stools, concerns for bleeding, dizziness, light headedness,  abdominal pain, leg swelling, and any other symptoms.    MEDICAL HISTORY:  Past Medical History:  Diagnosis Date  . Anemia   . Diabetes mellitus without complication ([place])   . Hypertension   . Insomnia     SURGICAL HISTORY: Past Surgical History:  Procedure Laterality Date  . CESAREAN SECTION    . SHOULDER SURGERY Left 2010    SOCIAL HISTORY: Social History   Socioeconomic History  . Marital status: Single    Spouse name: Not on file  . Number of children: Not on file  . Years of education: Not on file  . Highest education  level: Not on file  Occupational History  . Not on file  Social Needs  . Financial resource strain: Not on file  . Food insecurity:    Worry: Not on file    Inability: Not on file  . Transportation needs:    Medical: Not on file    Non-medical: Not on file  Tobacco Use  . Smoking status: Never Smoker  . Smokeless tobacco: Never Used  Substance and Sexual Activity  . Alcohol use: No  . Drug use: No  . Sexual activity: Not on file  Lifestyle  . Physical activity:    Days per week: Not on file    Minutes per session: Not on file  . Stress: Not on file  Relationships  . Social connections:    Talks on phone: Not on file    Gets together: Not on file    Attends religious service: Not on file    Active member of club or organization: Not on file    Attends meetings of clubs or organizations: Not on file    Relationship status: Not on file  . Intimate partner violence:    Fear of current or ex partner: Not on file    Emotionally abused: Not on file    Physically abused: Not on file    Forced sexual activity: Not on file  Other Topics Concern  . Not on file  Social History Narrative  . Not on file    FAMILY HISTORY: No family history on file.  ALLERGIES:  is allergic to cortisone; gabapentin; pork-derived products; and shellfish allergy.  MEDICATIONS:  Current Outpatient Medications  Medication Sig Dispense Refill  . ACCU-CHEK SOFTCLIX LANCETS lancets Use as instructed to test blood glucose twice daily. 100 each 12  . alendronate (FOSAMAX) 70 MG tablet TOME UNA TABLETA POR VIA ORAL EVERY 7 DAYS W/FULL GLASS OF WATER ON AN EMPTY STOMACH 12 tablet 3  . amLODipine (NORVASC) 5 MG tablet Take 0.5 tablets (2.5 mg total) by mouth daily. 15 tablet 6  . Blood Glucose Monitoring Suppl (ACCU-CHEK AVIVA PLUS) w/Device KIT To test blood glucose twice daily. 1 kit 0  . CALCIUM PO Take 1 tablet by mouth daily.    Marland Kitchen dexlansoprazole (DEXILANT) 60 MG capsule Take 1 capsule a day 30  capsule 0  . diclofenac sodium (VOLTAREN) 1 % GEL Apply 2 g topically 4 (four) times daily. (Patient taking differently: Apply 2 g topically 4 (four) times daily as needed (pain). ) 100 g 3  . Elastic Bandages & Supports (LUMBAR BACK BRACE/SUPPORT PAD) MISC 1 application by Does not apply route daily. 1 each 0  . glucose blood (ACCU-CHEK AVIVA PLUS) test strip Use as instructed to test blood glucose 2 times daily. 100 each 12  . Iron-FA-B Cmp-C-Biot-Probiotic (FUSION PLUS) CAPS Take 1 tablet by mouth every other day. As tolerated  90 capsule 2  . Lancet Devices (ACCU-CHEK SOFTCLIX) lancets Use as instructed to test blood glucose twice daily. 1 each 0  . metFORMIN (GLUCOPHAGE) 500 MG tablet Take 1 tablet (500 mg total) by mouth daily with breakfast. 90 tablet 1  . polyethylene glycol powder (GLYCOLAX/MIRALAX) powder Take 17 g by mouth 2 (two) times daily as needed. (Patient taking differently: Take 17 g by mouth 2 (two) times daily as needed (constipation). ) 3350 g 1  . senna-docusate (SENOKOT-S) 8.6-50 MG tablet Take 1 tablet by mouth at bedtime as needed for mild constipation. 120 tablet 3  . solifenacin (VESICARE) 10 MG tablet Take 1 tablet (10 mg total) by mouth daily. 30 tablet 3  . traZODone (DESYREL) 50 MG tablet Take 0.5 tablets (25 mg total) by mouth at bedtime as needed for sleep. 90 tablet 1  . Vitamin D, Cholecalciferol, 400 units CHEW Chew 2 tablets (800 Units total) by mouth daily. 120 tablet 6   No current facility-administered medications for this visit.     REVIEW OF SYSTEMS:    A 10+ POINT REVIEW OF SYSTEMS WAS OBTAINED including neurology, dermatology, psychiatry, cardiac, respiratory, lymph, extremities, GI, GU, Musculoskeletal, constitutional, breasts, reproductive, HEENT.  All pertinent positives are noted in the HPI.  All others are negative.   PHYSICAL EXAMINATION:  . Vitals:   03/25/18 0942  BP: (!) 148/88  Pulse: 75  Resp: 18  Temp: 98.2 F (36.8 C)  SpO2: 99%     Filed Weights   03/25/18 0942  Weight: 147 lb 3.2 oz (66.8 kg)   .Body mass index is 28.75 kg/m.  GENERAL:alert, in no acute distress and comfortable SKIN: no acute rashes, no significant lesions EYES: conjunctiva are pink and non-injected, sclera anicteric OROPHARYNX: MMM, no exudates, no oropharyngeal erythema or ulceration NECK: supple, no JVD LYMPH:  no palpable lymphadenopathy in the cervical, axillary or inguinal regions LUNGS: clear to auscultation b/l with normal respiratory effort HEART: regular rate & rhythm ABDOMEN:  normoactive bowel sounds , non tender, not distended. No palpable hepatosplenomegaly.  Extremity: no pedal edema PSYCH: alert & oriented x 3 with fluent speech NEURO: no focal motor/sensory deficits   LABORATORY DATA:  I have reviewed the data as listed  . CBC Latest Ref Rng & Units 03/25/2018 11/19/2017 10/15/2017  WBC 3.9 - 10.3 K/uL 4.4 4.9 5.1  Hemoglobin 11.6 - 15.9 g/dL 12.8 11.9 10.2(L)  Hematocrit 34.8 - 46.6 % 39.2 38.9 34.1(L)  Platelets 145 - 400 K/uL 310 276 466(H)    . CMP Latest Ref Rng & Units 03/25/2018 11/19/2017 10/15/2017  Glucose 70 - 99 mg/dL 108(H) 102 104(H)  BUN 8 - 23 mg/dL _0 Creatinine 0.44 - 1.00 mg/dL 0.72 0.69 0.59  Sodium 135 - 145 mmol/L 142 140 144  Potassium 3.5 - 5.1 mmol/L 4.1 4.7 4.0  Chloride 98 - 111 mmol/L 107 109 110  CO2 22 - 32 mmol/L _1 Calcium 8.9 - 10.3 mg/dL 9.6 9.2 9.1  Total Protein 6.5 - 8.1 g/dL 7.5 7.6 6.7  Total Bilirubin 0.3 - 1.2 mg/dL 0.6 0.4 2.0(H)  Alkaline Phos 38 - 126 U/L 74 80 74  AST 15 - 41 U/L _2 ALT 0 - 44 U/L _3 Component     Latest Ref Rng & Units 10/14/2017 10/15/2017  Iron     41 - 142 ug/dL 14 (L)   TIBC     236 - 444 ug/dL  429   Saturation Ratios     21 - 57 % 3 (L)   UIBC     ug/dL 415   Retic Ct Pct     0.4 - 3.1 % 5.0 (H)   RBC.     3.87 - 5.11 MIL/uL 3.58 (L)   Retic Count, Absolute     19.0 - 186.0 K/uL 179.0   Ferritin     9 - 269  ng/mL 10   Vitamin B12     180 - 914 pg/mL 421 353  Parietal Cell Antibody-IgG     0.0 - 20.0 Units 1.5   Intrinsic Factor     0.0 - 1.1 AU/mL 1.0   Magnesium     1.7 - 2.4 mg/dL  2.4  Phosphorus     2.5 - 4.6 mg/dL  3.1   . Lab Results  Component Value Date   IRON 70 03/25/2018   TIBC 285 03/25/2018   IRONPCTSAT 25 03/25/2018   (Iron and TIBC)  Lab Results  Component Value Date   FERRITIN 135 03/25/2018   B12 -- 377  RADIOGRAPHIC STUDIES: I have personally reviewed the radiological images as listed and agreed with the findings in the report. No results found.  ASSESSMENT & PLAN:   72 y.o. female   1. Severe microcytic Anemia due to Iron deficiency anemia - now resolved 2. Severe Iron deficiency due to chronic GI bleeding - corrected 3. Symptomatic anemia - with dizziness, pre syncopal symptoms.-resolved  PLAN: -Discussed pt labwork today, 03/25/18; blood counts have normalized including HGB at 12.8. Ferritin is WNL at 135. Iron and TIBC values all WNL.  -Recommended that the pt begin taking SL 1020mg Vitamin B12 and continue taking PO Iron replacement -Discussed that as there are no longer signs of active bleeding, her HGB has normalized, we would be happy to see this pt again as needed -Would be happy to see the pt if concerns of anemia or iron deficiency return  -ferritin is adequate currently >100-- no indication for additional IV Iron at this time. -No ibuprofen, NSAIDs or Fosamax -Reviewed previous endoscopy findings of hiatal hernia and two ulcers in the stomach. -Most recent July 2019 small capsule endoscopy revealed no signs of active blood loss  RTC with DR KIrene Limboas needed  All of the patients questions were answered with apparent satisfaction. The patient knows to call the clinic with any problems, questions or concerns.  The total time spent in the appt was 20 minutes and more than 50% was on counseling and direct patient cares.     GSullivan LoneMD MS  AAHIVMS SHiLLCrest HospitalCTrenton Psychiatric HospitalHematology/Oncology Physician [facility]an[facility] (Office):       [phone](Work cell):  [phone](Fax):           [phone] 03/25/2018 10:23 AM  I, SBaldwin Jamaica am acting as a scribe for Dr. KIrene Limbo .I have reviewed the above documentation for accuracy and completeness, and I agree with the above. .Brunetta GeneraMD

## 2018-03-25 ENCOUNTER — Inpatient Hospital Stay: Payer: Medicare Other

## 2018-03-25 ENCOUNTER — Inpatient Hospital Stay: Payer: Medicare Other | Attending: Hematology | Admitting: Hematology

## 2018-03-25 VITALS — BP 148/88 | HR 75 | Temp 98.2°F | Resp 18 | Ht 60.0 in | Wt 147.2 lb

## 2018-03-25 DIAGNOSIS — Z8711 Personal history of peptic ulcer disease: Secondary | ICD-10-CM

## 2018-03-25 DIAGNOSIS — E119 Type 2 diabetes mellitus without complications: Secondary | ICD-10-CM | POA: Diagnosis not present

## 2018-03-25 DIAGNOSIS — D500 Iron deficiency anemia secondary to blood loss (chronic): Secondary | ICD-10-CM

## 2018-03-25 DIAGNOSIS — I10 Essential (primary) hypertension: Secondary | ICD-10-CM | POA: Diagnosis not present

## 2018-03-25 DIAGNOSIS — Z862 Personal history of diseases of the blood and blood-forming organs and certain disorders involving the immune mechanism: Secondary | ICD-10-CM

## 2018-03-25 DIAGNOSIS — E538 Deficiency of other specified B group vitamins: Secondary | ICD-10-CM | POA: Diagnosis not present

## 2018-03-25 DIAGNOSIS — K449 Diaphragmatic hernia without obstruction or gangrene: Secondary | ICD-10-CM | POA: Diagnosis not present

## 2018-03-25 LAB — CMP (CANCER CENTER ONLY)
ALK PHOS: 74 U/L (ref 38–126)
ALT: 13 U/L (ref 0–44)
AST: 19 U/L (ref 15–41)
Albumin: 3.8 g/dL (ref 3.5–5.0)
Anion gap: 10 (ref 5–15)
BILIRUBIN TOTAL: 0.6 mg/dL (ref 0.3–1.2)
BUN: 15 mg/dL (ref 8–23)
CALCIUM: 9.6 mg/dL (ref 8.9–10.3)
CO2: 25 mmol/L (ref 22–32)
CREATININE: 0.72 mg/dL (ref 0.44–1.00)
Chloride: 107 mmol/L (ref 98–111)
Glucose, Bld: 108 mg/dL — ABNORMAL HIGH (ref 70–99)
Potassium: 4.1 mmol/L (ref 3.5–5.1)
SODIUM: 142 mmol/L (ref 135–145)
TOTAL PROTEIN: 7.5 g/dL (ref 6.5–8.1)

## 2018-03-25 LAB — CBC WITH DIFFERENTIAL (CANCER CENTER ONLY)
BASOS PCT: 1 %
Basophils Absolute: 0.1 10*3/uL (ref 0.0–0.1)
EOS ABS: 0.2 10*3/uL (ref 0.0–0.5)
Eosinophils Relative: 4 %
HCT: 39.2 % (ref 34.8–46.6)
Hemoglobin: 12.8 g/dL (ref 11.6–15.9)
Lymphocytes Relative: 29 %
Lymphs Abs: 1.3 10*3/uL (ref 0.9–3.3)
MCH: 30.6 pg (ref 25.1–34.0)
MCHC: 32.6 g/dL (ref 31.5–36.0)
MCV: 94 fL (ref 79.5–101.0)
MONOS PCT: 10 %
Monocytes Absolute: 0.5 10*3/uL (ref 0.1–0.9)
NEUTROS PCT: 56 %
Neutro Abs: 2.4 10*3/uL (ref 1.5–6.5)
Platelet Count: 310 10*3/uL (ref 145–400)
RBC: 4.16 MIL/uL (ref 3.70–5.45)
RDW: 13.9 % (ref 11.2–14.5)
WBC Count: 4.4 10*3/uL (ref 3.9–10.3)

## 2018-03-25 LAB — RETICULOCYTES
RBC.: 4.15 MIL/uL (ref 3.70–5.45)
RETIC CT PCT: 2.3 % — AB (ref 0.7–2.1)
Retic Count, Absolute: 95.5 10*3/uL — ABNORMAL HIGH (ref 33.7–90.7)

## 2018-03-25 LAB — IRON AND TIBC
Iron: 70 ug/dL (ref 41–142)
Saturation Ratios: 25 % (ref 21–57)
TIBC: 285 ug/dL (ref 236–444)
UIBC: 215 ug/dL

## 2018-03-25 LAB — VITAMIN B12: Vitamin B-12: 377 pg/mL (ref 180–914)

## 2018-03-25 LAB — FERRITIN: FERRITIN: 135 ng/mL (ref 11–307)

## 2018-03-28 ENCOUNTER — Telehealth: Payer: Self-pay

## 2018-03-28 ENCOUNTER — Other Ambulatory Visit: Payer: Self-pay | Admitting: Internal Medicine

## 2018-03-28 NOTE — Telephone Encounter (Signed)
Per 9/9 no los

## 2018-03-29 ENCOUNTER — Telehealth: Payer: Self-pay | Admitting: Internal Medicine

## 2018-03-29 ENCOUNTER — Other Ambulatory Visit: Payer: Self-pay

## 2018-03-29 DIAGNOSIS — I10 Essential (primary) hypertension: Secondary | ICD-10-CM

## 2018-03-29 MED ORDER — AMLODIPINE BESYLATE 5 MG PO TABS: 2.5000 mg | ORAL_TABLET | Freq: Every day | ORAL | 0 refills | Status: DC

## 2018-03-29 NOTE — Telephone Encounter (Signed)
Darnelle Maffucci from [facility] is calling regarding a patients refill request. Please follow up.

## 2018-04-08 ENCOUNTER — Encounter: Payer: Self-pay | Admitting: Internal Medicine

## 2018-04-08 ENCOUNTER — Ambulatory Visit: Payer: Medicare Other | Attending: Internal Medicine | Admitting: Internal Medicine

## 2018-04-08 ENCOUNTER — Telehealth: Payer: Self-pay | Admitting: Internal Medicine

## 2018-04-08 VITALS — BP 144/88 | HR 82 | Temp 98.3°F | Resp 16 | Wt 148.4 lb

## 2018-04-08 DIAGNOSIS — R7303 Prediabetes: Secondary | ICD-10-CM | POA: Diagnosis not present

## 2018-04-08 DIAGNOSIS — D500 Iron deficiency anemia secondary to blood loss (chronic): Secondary | ICD-10-CM | POA: Insufficient documentation

## 2018-04-08 DIAGNOSIS — Z91013 Allergy to seafood: Secondary | ICD-10-CM | POA: Diagnosis not present

## 2018-04-08 DIAGNOSIS — I10 Essential (primary) hypertension: Secondary | ICD-10-CM | POA: Insufficient documentation

## 2018-04-08 DIAGNOSIS — Z23 Encounter for immunization: Secondary | ICD-10-CM | POA: Diagnosis not present

## 2018-04-08 DIAGNOSIS — Z7984 Long term (current) use of oral hypoglycemic drugs: Secondary | ICD-10-CM | POA: Insufficient documentation

## 2018-04-08 DIAGNOSIS — Z79899 Other long term (current) drug therapy: Secondary | ICD-10-CM | POA: Diagnosis not present

## 2018-04-08 DIAGNOSIS — L659 Nonscarring hair loss, unspecified: Secondary | ICD-10-CM | POA: Diagnosis not present

## 2018-04-08 DIAGNOSIS — Z7983 Long term (current) use of bisphosphonates: Secondary | ICD-10-CM | POA: Insufficient documentation

## 2018-04-08 DIAGNOSIS — Z888 Allergy status to other drugs, medicaments and biological substances status: Secondary | ICD-10-CM | POA: Insufficient documentation

## 2018-04-08 DIAGNOSIS — E559 Vitamin D deficiency, unspecified: Secondary | ICD-10-CM | POA: Diagnosis not present

## 2018-04-08 DIAGNOSIS — M810 Age-related osteoporosis without current pathological fracture: Secondary | ICD-10-CM | POA: Insufficient documentation

## 2018-04-08 DIAGNOSIS — E119 Type 2 diabetes mellitus without complications: Secondary | ICD-10-CM | POA: Diagnosis not present

## 2018-04-08 DIAGNOSIS — K219 Gastro-esophageal reflux disease without esophagitis: Secondary | ICD-10-CM | POA: Diagnosis not present

## 2018-04-08 DIAGNOSIS — Z91018 Allergy to other foods: Secondary | ICD-10-CM | POA: Insufficient documentation

## 2018-04-08 LAB — POCT GLYCOSYLATED HEMOGLOBIN (HGB A1C): HbA1c POC (<> result, manual entry): 5.4 % (ref 4.0–5.6)

## 2018-04-08 LAB — GLUCOSE, POCT (MANUAL RESULT ENTRY): POC GLUCOSE: 99 mg/dL (ref 70–99)

## 2018-04-08 MED ORDER — DICLOFENAC SODIUM 1 % TD GEL: 2.0000 g | Freq: Four times a day (QID) | TRANSDERMAL | 4 refills | Status: DC | PRN

## 2018-04-08 MED ORDER — AMLODIPINE BESYLATE 5 MG PO TABS: 5.0000 mg | ORAL_TABLET | Freq: Every day | ORAL | 6 refills | Status: DC

## 2018-04-08 NOTE — Progress Notes (Signed)
Patient ID: Jodi [name], female    DOB: [birth year]  MRN: [id number]  CC: Diabetes and Hypertension   Subjective: Jodi [name] is a 72 y.o. female who presents for chronic disease management Her concerns today include:  72 yr old with hx of GERD, osteoporosis, Vit D def, diverticulosis, iron def, HTN and pre-DM.  Anemia:  Saw Dr. Penelope Coop with [facility]. Had EGD which revealed large hiatal hernia and capsule endoscopy which was nl.  Saw Dr. Irene Limbo.  Recent blood [name] reveals anemia improved.  C/o hair loss x 1 mth.  Had high-tlights placed in hair 2 mths ago.  She started taking OTC vitamin for hair and nails  HTN: compliant with Norvasc and low salt diet.  No chest pains or shortness of breath.  Pre-DM:  Compliant with Metformin.  She walks several times a week for exercise but does note some pain in her ankles when she walks.  No swelling in the ankles.  Patient Active Problem List   Diagnosis Date Noted  . Symptomatic anemia   . Iron deficiency anemia due to chronic blood loss 10/14/2017  . Anemia 10/14/2017  . Diabetes mellitus without complication ([place]) 44/31/5400  . HTN (hypertension), benign 03/16/2017  . Primary insomnia 03/16/2017  . Osteoporosis 01/08/2017  . GERD (gastroesophageal reflux disease) 01/08/2017  . Vitamin D deficiency 01/08/2017  . Edema 03/13/2014     Current Outpatient Medications on File Prior to Visit  Medication Sig Dispense Refill  . ACCU-CHEK SOFTCLIX LANCETS lancets Use as instructed to [name] blood glucose twice daily. 100 each 12  . alendronate (FOSAMAX) 70 MG tablet TOME UNA TABLETA POR VIA ORAL EVERY 7 DAYS W/FULL GLASS OF WATER ON AN EMPTY STOMACH 12 tablet 3  . Blood Glucose Monitoring Suppl (ACCU-CHEK AVIVA PLUS) w/Device KIT To [name] blood glucose twice daily. 1 kit 0  . CALCIUM PO Take 1 tablet by mouth daily.    Marland Kitchen dexlansoprazole (DEXILANT) 60 MG capsule Take 1 capsule a day 30 capsule 0  . Elastic Bandages & Supports (LUMBAR  BACK BRACE/SUPPORT PAD) MISC 1 application by Does not apply route daily. 1 each 0  . glucose blood (ACCU-CHEK AVIVA PLUS) [name] strip Use as instructed to [name] blood glucose 2 times daily. 100 each 12  . Iron-FA-B Cmp-C-Biot-Probiotic (FUSION PLUS) CAPS Take 1 tablet by mouth every other day. As tolerated 90 capsule 2  . Lancet Devices (ACCU-CHEK SOFTCLIX) lancets Use as instructed to [name] blood glucose twice daily. 1 each 0  . metFORMIN (GLUCOPHAGE) 500 MG tablet Take 1 tablet (500 mg total) by mouth daily with breakfast. 90 tablet 1  . senna-docusate (SENOKOT-S) 8.6-50 MG tablet Take 1 tablet by mouth at bedtime as needed for mild constipation. 120 tablet 3  . solifenacin (VESICARE) 10 MG tablet TOME UNA TABLETA TODOS LOS DIAS 90 tablet 0  . Vitamin D, Cholecalciferol, 400 units CHEW Chew 2 tablets (800 Units total) by mouth daily. 120 tablet 6   No current facility-administered medications on file prior to visit.     Allergies  Allergen Reactions  . Cortisone     Rash, swelling  . Gabapentin Nausea Only    With dizziness  . Pork-Derived Products     Rash, joint pain  . Shellfish Allergy Rash and Other (See Comments)    joint pain, GI pain    Social History   Socioeconomic History  . Marital status: Single    Spouse name: Not on file  . Number of  children: Not on file  . Years of education: Not on file  . Highest education level: Not on file  Occupational History  . Not on file  Social Needs  . Financial resource strain: Not on file  . Food insecurity:    Worry: Not on file    Inability: Not on file  . Transportation needs:    Medical: Not on file    Non-medical: Not on file  Tobacco Use  . Smoking status: Never Smoker  . Smokeless tobacco: Never Used  Substance and Sexual Activity  . Alcohol use: No  . Drug use: No  . Sexual activity: Not on file  Lifestyle  . Physical activity:    Days per week: Not on file    Minutes per session: Not on file  . Stress: Not on  file  Relationships  . Social connections:    Talks on phone: Not on file    Gets together: Not on file    Attends religious service: Not on file    Active member of club or organization: Not on file    Attends meetings of clubs or organizations: Not on file    Relationship status: Not on file  . Intimate partner violence:    Fear of current or ex partner: Not on file    Emotionally abused: Not on file    Physically abused: Not on file    Forced sexual activity: Not on file  Other Topics Concern  . Not on file  Social History Narrative  . Not on file    No family history on file.  Past Surgical History:  Procedure Laterality Date  . CESAREAN SECTION    . SHOULDER SURGERY Left 2010    ROS: Review of Systems Neg except as above  PHYSICAL EXAM: BP (!) 144/88   Pulse 82   Temp 98.3 F (36.8 C) (Oral)   Resp 16   Wt 148 lb 6.4 oz (67.3 kg)   SpO2 95%   BMI 28.98 kg/m   Physical Exam  General appearance - alert, well appearing, and in no distress Mental status - normal mood, behavior, speech, dress, motor activity, and thought processes Neck - supple, no significant adenopathy Chest - clear to auscultation, no wheezes, rales or rhonchi, symmetric air entry Heart - normal rate, regular rhythm, normal S1, S2, no murmurs, rubs, clicks or gallops Extremities - peripheral pulses normal, no pedal edema, no clubbing or cyanosis Skin - some thinning of hair on crown.  + Mild breakage noted on pulling on the shaft of the hair. MSK: Ankles: No edema or erythema.  Good range of motion.  No point tenderness. Results for orders placed or performed in visit on 04/08/18  POCT glucose (manual entry)  Result Value Ref Range   POC Glucose 99 70 - 99 mg/dl  POCT glycosylated hemoglobin (Hb A1C)  Result Value Ref Range   Hemoglobin A1C     HbA1c POC (<> result, manual entry) 5.4 4.0 - 5.6 %   HbA1c, POC (prediabetic range)     HbA1c, POC (controlled diabetic range)     ASSESSMENT  AND PLAN: 1. Prediabetes Continue metformin, healthy eating and regular exercise. - POCT glucose (manual entry) - POCT glycosylated hemoglobin (Hb A1C) - Microalbumin / creatinine urine ratio  2. Essential hypertension Not at goal.  Review of her flowsheet shows blood pressure was also elevated on her recent visit with her hematologist Dr. Marylyn Ishihara.  I recommend increase Norvasc to 5 mg daily. -  amLODipine (NORVASC) 5 MG tablet; Take 1 tablet (5 mg total) by mouth daily.  Dispense: 30 tablet; Refill: 6  3. Hair loss Likely due to over processing.  Advised against putting highlights/color in the hair.  Will check TSH.  If problem persists we will need to refer to dermatology. - TSH  4. Need for influenza vaccination To be given today.  Patient was given the opportunity to ask questions.  Patient verbalized understanding of the plan and was able to repeat key elements of the plan.  [employer] interpreter used during this encounter.  Orders Placed This Encounter  Procedures  . Microalbumin / creatinine urine ratio  . TSH  . POCT glucose (manual entry)  . POCT glycosylated hemoglobin (Hb A1C)     Requested Prescriptions   Signed Prescriptions Disp Refills  . amLODipine (NORVASC) 5 MG tablet 30 tablet 6    Sig: Take 1 tablet (5 mg total) by mouth daily.    Return in about 4 months (around 08/08/2018).  Karle Plumber, MD, FACP

## 2018-04-08 NOTE — Telephone Encounter (Signed)
Pt called to request a medication refill on -diclofenac sodium (VOLTAREN) 1 % GEL  To [id number] [place], [place] - [address] Please follow up

## 2018-04-08 NOTE — Patient Instructions (Signed)
Increase Amlodipine to 5 mg daily

## 2018-04-09 LAB — MICROALBUMIN / CREATININE URINE RATIO
CREATININE, UR: 38.2 mg/dL
Microalb/Creat Ratio: <7.9 mg/g creat (ref 0.0–30.0)
Microalbumin, Urine: <3 ug/mL

## 2018-04-09 LAB — TSH: TSH: 0.723 u[IU]/mL (ref 0.450–4.500)

## 2018-04-11 ENCOUNTER — Telehealth: Payer: Self-pay | Admitting: Internal Medicine

## 2018-04-11 DIAGNOSIS — L659 Nonscarring hair loss, unspecified: Secondary | ICD-10-CM

## 2018-04-11 NOTE — Telephone Encounter (Signed)
Patient would like referral to dermatologist for hair loss. Please follow up with patient.

## 2018-04-11 NOTE — Telephone Encounter (Signed)
Will forward to covering provider.

## 2018-04-12 ENCOUNTER — Telehealth: Payer: Self-pay

## 2018-04-12 NOTE — Telephone Encounter (Signed)
[facility]  Id#  [phone] contacted pt to go over lab results pt is aware of results and doesn't have any questions or concerns

## 2018-04-26 ENCOUNTER — Other Ambulatory Visit: Payer: Self-pay | Admitting: Internal Medicine

## 2018-04-26 DIAGNOSIS — I10 Essential (primary) hypertension: Secondary | ICD-10-CM

## 2018-05-13 ENCOUNTER — Telehealth: Payer: Self-pay | Admitting: Internal Medicine

## 2018-05-13 NOTE — Telephone Encounter (Signed)
Dunia with [employer] called for authorization for the diabetic test supplies. Please follow up.

## 2018-05-13 NOTE — Telephone Encounter (Signed)
We received a fax from them will fax it when it is complete

## 2018-05-25 ENCOUNTER — Telehealth: Payer: Self-pay | Admitting: Internal Medicine

## 2018-05-25 NOTE — Telephone Encounter (Signed)
[place] with [employer] called to check on the prescription request for the patient regarding diabetic supplies.[place] was also informed request was received on 10/25. Was told it will be faxed once completed.

## 2018-05-28 ENCOUNTER — Other Ambulatory Visit: Payer: Self-pay | Admitting: Internal Medicine

## 2018-06-13 ENCOUNTER — Telehealth: Payer: Self-pay | Admitting: Internal Medicine

## 2018-06-13 NOTE — Telephone Encounter (Signed)
I have faxed it but I discarded it after I received transmission log

## 2018-06-13 NOTE — Telephone Encounter (Signed)
Dunia with [employer] called regarding the patients diabetic testing supplies.   Please follow up with dunia she would like a FU on authorization for this request.

## 2018-06-13 NOTE — Telephone Encounter (Signed)
[place] have you received anything regarding this

## 2018-06-13 NOTE — Telephone Encounter (Signed)
No I have not, it looks like on 05/13/18 we received a fax and you said that it would be faxed back once it was completed. Any idea where it may have ended up? I tried to call back and see if it was something I could help with over the phone but no one answered.

## 2018-06-14 NOTE — Telephone Encounter (Signed)
Jodi [name] was able to find the fax, they want information about the patients diabetic testing supplies. This patient is not diabetic, she was prediabetic in 2018 but was able to lower her A1C and is no longer diabetic. She doesn't have a current prescription for diabetic testing supplies because she does not need to test her blood sugar. I will attempt to call and inform the company, if they call back please inform them.

## 2018-06-18 ENCOUNTER — Other Ambulatory Visit: Payer: Self-pay | Admitting: Family Medicine

## 2018-06-18 DIAGNOSIS — I10 Essential (primary) hypertension: Secondary | ICD-10-CM

## 2018-06-20 ENCOUNTER — Other Ambulatory Visit: Payer: Self-pay

## 2018-06-20 ENCOUNTER — Telehealth: Payer: Self-pay | Admitting: Internal Medicine

## 2018-06-20 DIAGNOSIS — I10 Essential (primary) hypertension: Secondary | ICD-10-CM

## 2018-06-20 MED ORDER — AMLODIPINE BESYLATE 5 MG PO TABS: ORAL_TABLET | ORAL | 2 refills | Status: DC

## 2018-06-20 MED ORDER — SOLIFENACIN SUCCINATE 10 MG PO TABS: ORAL_TABLET | ORAL | 0 refills | Status: DC

## 2018-06-20 NOTE — Telephone Encounter (Signed)
1) Medication(s) Requested (by name): Amlodipine solifenacin 2) [facility] [address]

## 2018-06-20 NOTE — Telephone Encounter (Signed)
Company called back and was informed of message left. They understood and stated they would also follow up with patient to let them know.

## 2018-06-20 NOTE — Telephone Encounter (Signed)
rx has been sent 

## 2018-06-30 ENCOUNTER — Other Ambulatory Visit: Payer: Self-pay | Admitting: Internal Medicine

## 2018-06-30 DIAGNOSIS — K219 Gastro-esophageal reflux disease without esophagitis: Secondary | ICD-10-CM

## 2018-06-30 NOTE — Telephone Encounter (Signed)
1) Medication(s) Requested (by name): dexlansoprazole diclofenac 2) [facility] [address]

## 2018-07-01 MED ORDER — DICLOFENAC SODIUM 1 % TD GEL: 2.0000 g | Freq: Four times a day (QID) | TRANSDERMAL | 2 refills | Status: DC | PRN

## 2018-07-01 NOTE — Telephone Encounter (Signed)
1) Medication(s) Requested (by name): dexlansoprazole diclofenac 2) [facility] [address]

## 2018-07-03 MED ORDER — DEXLANSOPRAZOLE 60 MG PO CPDR: DELAYED_RELEASE_CAPSULE | ORAL | 3 refills | Status: DC

## 2018-07-11 ENCOUNTER — Telehealth: Payer: Self-pay | Admitting: Internal Medicine

## 2018-07-11 NOTE — Telephone Encounter (Signed)
1) Medication(s) Requested (by name): solifenacin 2) [facility] [address]

## 2018-07-14 NOTE — Telephone Encounter (Signed)
Pt should have refills at her pharmacy, she should contact them directly for refill.

## 2018-07-21 ENCOUNTER — Telehealth: Payer: Self-pay | Admitting: Internal Medicine

## 2018-07-21 MED ORDER — OXYBUTYNIN CHLORIDE ER 5 MG PO TB24: 5.0000 mg | ORAL_TABLET | Freq: Every day | ORAL | 5 refills | Status: DC

## 2018-07-21 NOTE — Telephone Encounter (Signed)
Pt came in to request a change in medication, she states that her insurance no longer covers  -solifenacin (VESICARE) 10 MG tablet  Please [facility] an alternative medication [id number] [address], [place] as soon as possible.

## 2018-08-01 ENCOUNTER — Telehealth: Payer: Self-pay | Admitting: Internal Medicine

## 2018-08-01 NOTE — Telephone Encounter (Signed)
1) Medication(s) Requested (by name): -ibuprofen (ADVIL,MOTRIN) 800 MG tablet [[id number]  2) [id number] [place] [place] [address]) Special Requests:   Approved medications will be sent to the pharmacy, we will reach out if there is an issue.  Requests made after 3pm may not be addressed until the following business day!

## 2018-08-04 ENCOUNTER — Ambulatory Visit: Payer: Medicare Other | Attending: Family Medicine | Admitting: Physician Assistant

## 2018-08-04 ENCOUNTER — Other Ambulatory Visit: Payer: Self-pay

## 2018-08-04 NOTE — Telephone Encounter (Signed)
Patient presented with a concern of [facility] not [facility] by [facility]. MA worked with [employer] to complete a PA which was not required. [facility] and patients medication was only 1.30. Patient also states oxybutynin has not helped her incontinence and she prefers the vesicae. PCP was willing to place a refill and is aware of patient requesting the oxybutynin be discarded and noted as not helping.

## 2018-08-04 NOTE — Telephone Encounter (Signed)
Pt is seeing Levada Dy today and she will notify patient

## 2018-08-05 ENCOUNTER — Telehealth: Payer: Self-pay | Admitting: Internal Medicine

## 2018-08-05 NOTE — Telephone Encounter (Signed)
Pt called to request a medication refill on -solifenacin (VESICARE) 10 MG tablet [[id number] She states that without this medication she is not able to control her bladder. Please follow up to [id number] [place], [place] At patients request if this medication wont be paid by her insurance please provide her pharmacy an alternative.

## 2018-08-08 NOTE — Telephone Encounter (Signed)
Will forward to pcp

## 2018-08-08 NOTE — Telephone Encounter (Signed)
I can attempt a PA with the patients insurance but the Vesicare was never linked with a specific DX code and there is nothing bladder/incontinence related in her problem list. Please advise.

## 2018-08-10 DIAGNOSIS — M2042 Other hammer toe(s) (acquired), left foot: Secondary | ICD-10-CM | POA: Diagnosis not present

## 2018-08-10 DIAGNOSIS — M65872 Other synovitis and tenosynovitis, left ankle and foot: Secondary | ICD-10-CM | POA: Diagnosis not present

## 2018-08-10 DIAGNOSIS — M79672 Pain in left foot: Secondary | ICD-10-CM | POA: Diagnosis not present

## 2018-08-10 DIAGNOSIS — M25572 Pain in left ankle and joints of left foot: Secondary | ICD-10-CM | POA: Diagnosis not present

## 2018-08-24 DIAGNOSIS — M25572 Pain in left ankle and joints of left foot: Secondary | ICD-10-CM | POA: Diagnosis not present

## 2018-08-24 DIAGNOSIS — M65872 Other synovitis and tenosynovitis, left ankle and foot: Secondary | ICD-10-CM | POA: Diagnosis not present

## 2018-08-29 ENCOUNTER — Ambulatory Visit: Payer: Medicare Other | Attending: Internal Medicine | Admitting: Internal Medicine

## 2018-08-29 ENCOUNTER — Encounter: Payer: Self-pay | Admitting: Internal Medicine

## 2018-08-29 VITALS — BP 136/87 | HR 98 | Temp 98.2°F | Resp 16 | Wt 156.4 lb

## 2018-08-29 DIAGNOSIS — E119 Type 2 diabetes mellitus without complications: Secondary | ICD-10-CM | POA: Insufficient documentation

## 2018-08-29 DIAGNOSIS — Z888 Allergy status to other drugs, medicaments and biological substances status: Secondary | ICD-10-CM | POA: Diagnosis not present

## 2018-08-29 DIAGNOSIS — Z79899 Other long term (current) drug therapy: Secondary | ICD-10-CM | POA: Diagnosis not present

## 2018-08-29 DIAGNOSIS — K219 Gastro-esophageal reflux disease without esophagitis: Secondary | ICD-10-CM | POA: Diagnosis not present

## 2018-08-29 DIAGNOSIS — D500 Iron deficiency anemia secondary to blood loss (chronic): Secondary | ICD-10-CM | POA: Insufficient documentation

## 2018-08-29 DIAGNOSIS — Z91018 Allergy to other foods: Secondary | ICD-10-CM | POA: Diagnosis not present

## 2018-08-29 DIAGNOSIS — I10 Essential (primary) hypertension: Secondary | ICD-10-CM | POA: Diagnosis not present

## 2018-08-29 DIAGNOSIS — Z6830 Body mass index (BMI) 30.0-30.9, adult: Secondary | ICD-10-CM | POA: Diagnosis not present

## 2018-08-29 DIAGNOSIS — M810 Age-related osteoporosis without current pathological fracture: Secondary | ICD-10-CM | POA: Insufficient documentation

## 2018-08-29 DIAGNOSIS — E1165 Type 2 diabetes mellitus with hyperglycemia: Secondary | ICD-10-CM

## 2018-08-29 DIAGNOSIS — R7303 Prediabetes: Secondary | ICD-10-CM

## 2018-08-29 DIAGNOSIS — Z7984 Long term (current) use of oral hypoglycemic drugs: Secondary | ICD-10-CM | POA: Insufficient documentation

## 2018-08-29 DIAGNOSIS — E669 Obesity, unspecified: Secondary | ICD-10-CM | POA: Diagnosis not present

## 2018-08-29 DIAGNOSIS — N3281 Overactive bladder: Secondary | ICD-10-CM | POA: Insufficient documentation

## 2018-08-29 DIAGNOSIS — E559 Vitamin D deficiency, unspecified: Secondary | ICD-10-CM | POA: Insufficient documentation

## 2018-08-29 DIAGNOSIS — Z91013 Allergy to seafood: Secondary | ICD-10-CM | POA: Insufficient documentation

## 2018-08-29 LAB — GLUCOSE, POCT (MANUAL RESULT ENTRY): POC Glucose: 157 mg/dl — AB (ref 70–99)

## 2018-08-29 MED ORDER — FERROUS SULFATE 325 (65 FE) MG PO TABS: 325.0000 mg | ORAL_TABLET | Freq: Every day | ORAL | 1 refills | Status: DC

## 2018-08-29 MED ORDER — SOLIFENACIN SUCCINATE 10 MG PO TABS: 10.0000 mg | ORAL_TABLET | Freq: Every day | ORAL | 5 refills | Status: DC

## 2018-08-29 MED ORDER — AMLODIPINE BESYLATE 5 MG PO TABS: ORAL_TABLET | ORAL | 6 refills | Status: DC

## 2018-08-29 NOTE — Progress Notes (Signed)
Patient ID: Jodi [name], female    DOB: 11-Jan-1946  MRN: [id number]  CC: Diabetes (prediabetes) and Hypertension   Subjective: Jodi [name] is a 73 y.o. female who presents for chronic ds management Her concerns today include:  73 yr old with hx of GERD, osteoporosis, Vit D def, diverticulosis,iron def,HTN and pre-DM.  Overactive bladder:  Pt reports cough and feeling like her throat was closing when she took [place].  She tried it for 1 week and kept having the same symptoms so she discontinued taking it.  She was on Vesicare prior to this and it worked well.  Her insurance would not approve Vesicare without her having tried other formulary medication. -since being off med, she is unable to hold urine until she gets to rest room.  Having to wear pads.    HTN:  Pt states her insurance will no longer pay for Norvasc.  Took med already for today.  Limitis salt.  She denies any chest pains or shortness of breath.  No lower extremity edema.  Pre-DM:  Taking Metformin as prescribed.  No longer gets testing supplies as she is not diabetic She was walking regularly until recently when she started having pain in LT foot.  Saw ortho and was given injection to the sole of the feet and given a support boot to wear.  She has a follow-up appointment with them later this month.  Once cleared she will start walking for exercise again  Anemia:  Still taking iron.  Denies any dizziness or fatigue.  Patient Active Problem List   Diagnosis Date Noted  . Symptomatic anemia   . Iron deficiency anemia due to chronic blood loss 10/14/2017  . Diabetes mellitus without complication ([place]) 67/20/9470  . HTN (hypertension), benign 03/16/2017  . Primary insomnia 03/16/2017  . Osteoporosis 01/08/2017  . GERD (gastroesophageal reflux disease) 01/08/2017  . Vitamin D deficiency 01/08/2017  . Edema 03/13/2014     Current Outpatient Medications on File Prior to Visit  Medication Sig Dispense  Refill  . ACCU-CHEK SOFTCLIX LANCETS lancets Use as instructed to test blood glucose twice daily. 100 each 12  . alendronate (FOSAMAX) 70 MG tablet TOME UNA TABLETA POR VIA ORAL EVERY 7 DAYS W/FULL GLASS OF WATER ON AN EMPTY STOMACH 12 tablet 3  . Blood Glucose Monitoring Suppl (ACCU-CHEK AVIVA PLUS) w/Device KIT To test blood glucose twice daily. 1 kit 0  . CALCIUM PO Take 1 tablet by mouth daily.    Marland Kitchen dexlansoprazole (DEXILANT) 60 MG capsule Take 1 capsule a day 30 capsule 3  . diclofenac sodium (VOLTAREN) 1 % GEL Apply 2 g topically 4 (four) times daily as needed (pain). 100 g 2  . Elastic Bandages & Supports (LUMBAR BACK BRACE/SUPPORT PAD) MISC 1 application by Does not apply route daily. 1 each 0  . glucose blood (ACCU-CHEK AVIVA PLUS) test strip Use as instructed to test blood glucose 2 times daily. 100 each 12  . Iron-FA-B Cmp-C-Biot-Probiotic (FUSION PLUS) CAPS Take 1 tablet by mouth every other day. As tolerated 90 capsule 2  . Lancet Devices (ACCU-CHEK SOFTCLIX) lancets Use as instructed to test blood glucose twice daily. 1 each 0  . metFORMIN (GLUCOPHAGE) 500 MG tablet TOME UNA TABLETA TODOS LOS DIAS CON EL DESAYUNO 90 tablet 1  . senna-docusate (SENOKOT-S) 8.6-50 MG tablet Take 1 tablet by mouth at bedtime as needed for mild constipation. 120 tablet 3  . Vitamin D, Cholecalciferol, 400 units CHEW Chew 2 tablets (800 Units  total) by mouth daily. 120 tablet 6   No current facility-administered medications on file prior to visit.     Allergies  Allergen Reactions  . Cortisone     Rash, swelling  . Gabapentin Nausea Only    With dizziness  . Pork-Derived Products     Rash, joint pain  . Shellfish Allergy Rash and Other (See Comments)    joint pain, GI pain    Social History   Socioeconomic History  . Marital status: Single    Spouse name: Not on file  . Number of children: Not on file  . Years of education: Not on file  . Highest education level: Not on file    Occupational History  . Not on file  Social Needs  . Financial resource strain: Not on file  . Food insecurity:    Worry: Not on file    Inability: Not on file  . Transportation needs:    Medical: Not on file    Non-medical: Not on file  Tobacco Use  . Smoking status: Never Smoker  . Smokeless tobacco: Never Used  Substance and Sexual Activity  . Alcohol use: No  . Drug use: No  . Sexual activity: Not on file  Lifestyle  . Physical activity:    Days per week: Not on file    Minutes per session: Not on file  . Stress: Not on file  Relationships  . Social connections:    Talks on phone: Not on file    Gets together: Not on file    Attends religious service: Not on file    Active member of club or organization: Not on file    Attends meetings of clubs or organizations: Not on file    Relationship status: Not on file  . Intimate partner violence:    Fear of current or ex partner: Not on file    Emotionally abused: Not on file    Physically abused: Not on file    Forced sexual activity: Not on file  Other Topics Concern  . Not on file  Social History Narrative  . Not on file    No family history on file.  Past Surgical History:  Procedure Laterality Date  . CESAREAN SECTION    . SHOULDER SURGERY Left 2010    ROS: Review of Systems Negative except as stated above  PHYSICAL EXAM: BP 136/87   Pulse 98   Temp 98.2 F (36.8 C) (Oral)   Resp 16   Wt 156 lb 6.4 oz (70.9 kg)   SpO2 96%   BMI 30.54 kg/m   Wt Readings from Last 3 Encounters:  08/29/18 156 lb 6.4 oz (70.9 kg)  04/08/18 148 lb 6.4 oz (67.3 kg)  03/25/18 147 lb 3.2 oz (66.8 kg)    Physical Exam  General appearance - alert, well appearing, and in no distress Mental status - normal mood, behavior, speech, dress, motor activity, and thought processes Neck - supple, no significant adenopathy Chest - clear to auscultation, no wheezes, rales or rhonchi, symmetric air entry Heart - normal rate,  regular rhythm, normal S1, S2, no murmurs, rubs, clicks or gallops Extremities - peripheral pulses normal, no pedal edema, no clubbing or cyanosis  CMP Latest Ref Rng & Units 03/25/2018 11/19/2017 10/15/2017  Glucose 70 - 99 mg/dL 108(H) 102 104(H)  BUN 8 - 23 mg/dL _0 Creatinine 0.44 - 1.00 mg/dL 0.72 0.69 0.59  Sodium 135 - 145 mmol/L 142 140 144  Potassium 3.5 - 5.1 mmol/L 4.1 4.7 4.0  Chloride 98 - 111 mmol/L 107 109 110  CO2 22 - 32 mmol/L _0 Calcium 8.9 - 10.3 mg/dL 9.6 9.2 9.1  Total Protein 6.5 - 8.1 g/dL 7.5 7.6 6.7  Total Bilirubin 0.3 - 1.2 mg/dL 0.6 0.4 2.0(H)  Alkaline Phos 38 - 126 U/L 74 80 74  AST 15 - 41 U/L _1 ALT 0 - 44 U/L _2 Lipid Panel     Component Value Date/Time   CHOL 185 09/28/2017 1628   TRIG 128 09/28/2017 1628   HDL 65 09/28/2017 1628   CHOLHDL 2.8 09/28/2017 1628   CHOLHDL 2.8 04/02/2016 1042   VLDL 21 04/02/2016 1042   LDLCALC 94 09/28/2017 1628    CBC    Component Value Date/Time   WBC 4.4 03/25/2018 0912   WBC 5.1 10/15/2017 0820   RBC 4.16 03/25/2018 0912   RBC 4.15 03/25/2018 0912   HGB 12.8 03/25/2018 0912   HGB 5.6 (LL) 09/28/2017 1628   HCT 39.2 03/25/2018 0912   HCT 34.1 10/15/2017 0820   PLT 310 03/25/2018 0912   PLT 515 (H) 09/28/2017 1628   MCV 94.0 03/25/2018 0912   MCV 67 (L) 09/28/2017 1628   MCH 30.6 03/25/2018 0912   MCHC 32.6 03/25/2018 0912   RDW 13.9 03/25/2018 0912   RDW 21.3 (H) 09/28/2017 1628   LYMPHSABS 1.3 03/25/2018 0912   MONOABS 0.5 03/25/2018 0912   EOSABS 0.2 03/25/2018 0912   BASOSABS 0.1 03/25/2018 0912    Results for orders placed or performed in visit on 08/29/18  POCT glucose (manual entry)  Result Value Ref Range   POC Glucose 157 (A) 70 - 99 mg/dl   Lab Results  Component Value Date   HGBA1C 5.4 04/08/2018   Depression screen [facility] 2/9 08/29/2018 04/08/2018 11/24/2017  Decreased Interest 2 0 3  Down, Depressed, Hopeless 0 2 3  PHQ - 2 Score _3 Altered sleeping  1 0 3  Tired, decreased energy _4 Change in appetite _5 Feeling bad or failure about yourself  0 0 0  Trouble concentrating _6 Moving slowly or fidgety/restless (No Data) 2 2  Suicidal thoughts (No Data) 0 0  PHQ-9 Score _7 Some recent data might be hidden    ASSESSMENT AND PLAN:  1. Prediabetes Continue metformin.  She does not need to check blood sugars. - POCT glucose (manual entry)  2. Essential hypertension I will have our pharmacy tech check into the amlodipine.  It would not make sense that this is not on formulary with her insurance. - amLODipine (NORVASC) 5 MG tablet; TAKE 1/2 TABLET POR VIA ORAL A DIARIO  Dispense: 15 tablet; Refill: 6  3. Overactive bladder We will try have our clinical pharmacist get prior approval on the Vesicare since she failed the Ditropan due to adverse side effects - solifenacin (VESICARE) 10 MG tablet; Take 1 tablet (10 mg total) by mouth daily.  Dispense: 30 tablet; Refill: 5  4. Iron deficiency anemia due to chronic blood loss - ferrous sulfate 325 (65 FE) MG tablet; Take 1 tablet (325 mg total) by mouth daily with breakfast.  Dispense: 90 tablet; Refill: 1 - CBC  5. Obesity (BMI 30-39.9) Healthy eating habits discussed and encouraged.  She will start walking again once her left foot is better   Patient was given  the opportunity to ask questions.  Patient verbalized understanding of the plan and was able to repeat key elements of the plan.  [employer] interpreter used during this encounter. #[id number].  Orders Placed This Encounter  Procedures  . CBC  . POCT glucose (manual entry)     Requested Prescriptions   Signed Prescriptions Disp Refills  . amLODipine (NORVASC) 5 MG tablet 15 tablet 6    Sig: TAKE 1/2 TABLET POR VIA ORAL A DIARIO  . solifenacin (VESICARE) 10 MG tablet 30 tablet 5    Sig: Take 1 tablet (10 mg total) by mouth daily.  . ferrous sulfate 325 (65 FE) MG tablet 90 tablet 1    Sig: Take 1 tablet (325  mg total) by mouth daily with breakfast.    Return in about 4 months (around 12/28/2018).  Karle Plumber, MD, FACP

## 2018-08-30 LAB — CBC
Hematocrit: 36 % (ref 34.0–46.6)
Hemoglobin: 12.1 g/dL (ref 11.1–15.9)
MCH: 30.2 pg (ref 26.6–33.0)
MCHC: 33.6 g/dL (ref 31.5–35.7)
MCV: 90 fL (ref 79–97)
Platelets: 400 10*3/uL (ref 150–450)
RBC: 4.01 x10E6/uL (ref 3.77–5.28)
RDW: 12.7 % (ref 11.7–15.4)
WBC: 6 10*3/uL (ref 3.4–10.8)

## 2018-09-05 DIAGNOSIS — I890 Lymphedema, not elsewhere classified: Secondary | ICD-10-CM | POA: Diagnosis not present

## 2018-09-05 DIAGNOSIS — M25572 Pain in left ankle and joints of left foot: Secondary | ICD-10-CM | POA: Diagnosis not present

## 2018-09-19 ENCOUNTER — Telehealth: Payer: Self-pay | Admitting: Internal Medicine

## 2018-09-19 NOTE — Telephone Encounter (Signed)
1) Medication(s) Requested (by name): solifenacin (VESICARE) 10 MG tablet  2) Pharmacy [facility]: [id number] [place], [place]  3) Special Requests: Pt called stated she is wanting a follow up on the medication solifenacin (vesicare) 10MG  tablet would like to know if its going to be approved or if shes going to get something in place of it please follow up  Approved medications will be sent to the pharmacy, we will reach out if there is an issue.  Requests made after 3pm may not be addressed until the following business day!  If a patient is unsure of the name of the medication(s) please note and ask patient to call back when they are able to provide all info, do not send to responsible party until all information is available!

## 2018-09-21 NOTE — Telephone Encounter (Signed)
Request was submitted to [facility], we are expected to hear back no later than 09/28/18.

## 2018-09-22 ENCOUNTER — Telehealth: Payer: Self-pay | Admitting: Internal Medicine

## 2018-09-22 NOTE — Telephone Encounter (Signed)
Patient called stating her insurance will not cover solifenacin (VESICARE) 10 MG tablet. Spoke with [employer] and she has already sent a PA to [facility]. Patient stated it has been so long with out taking the medication and would like to know if she can be prescribed something else. Patient was asked it Oxybutynin helped her but she stated that it did not. Please f/u with patient.

## 2018-09-22 NOTE — Telephone Encounter (Signed)
Will forward to pcp

## 2018-09-23 MED ORDER — TOLTERODINE TARTRATE ER 2 MG PO CP24: 2.0000 mg | ORAL_CAPSULE | Freq: Every day | ORAL | 0 refills | Status: DC

## 2018-09-23 NOTE — Telephone Encounter (Signed)
The appeal is still pending with [employer], when I submitted it they said we should expect a response by 09/28/18.

## 2018-09-26 NOTE — Telephone Encounter (Signed)
[employer] has sent further information, they have two formulary alternatives Myrbetriq and Toviaz. If theses are acceptable alternatives please send new RX for one of the preferred products. If they are not acceptable for this patient please provide clinical rationale stating why each drug is inappropriate, this information would be submitted to [facility] for an appeal of the original PA which was denied.

## 2018-09-28 MED ORDER — MIRABEGRON ER 25 MG PO TB24: 25.0000 mg | ORAL_TABLET | Freq: Every day | ORAL | 2 refills | Status: DC

## 2018-09-28 NOTE — Telephone Encounter (Signed)
[place] interpreters [place]  [phone]  contacted pt to go over Dr. Wynetta Emery pt didn't answer left a detailed vm informing pt of this information and if she has any questions or concerns to give me a call

## 2018-10-01 ENCOUNTER — Other Ambulatory Visit: Payer: Self-pay | Admitting: Internal Medicine

## 2018-10-01 DIAGNOSIS — K219 Gastro-esophageal reflux disease without esophagitis: Secondary | ICD-10-CM

## 2018-10-03 ENCOUNTER — Other Ambulatory Visit: Payer: Self-pay | Admitting: Internal Medicine

## 2018-10-04 ENCOUNTER — Other Ambulatory Visit: Payer: Self-pay | Admitting: Internal Medicine

## 2018-10-13 ENCOUNTER — Other Ambulatory Visit: Payer: Self-pay | Admitting: Pharmacist

## 2018-11-11 ENCOUNTER — Other Ambulatory Visit: Payer: Self-pay | Admitting: Family Medicine

## 2018-11-11 DIAGNOSIS — M810 Age-related osteoporosis without current pathological fracture: Secondary | ICD-10-CM

## 2018-11-20 ENCOUNTER — Other Ambulatory Visit: Payer: Self-pay | Admitting: Internal Medicine

## 2018-12-05 ENCOUNTER — Other Ambulatory Visit: Payer: Self-pay | Admitting: Internal Medicine

## 2018-12-05 DIAGNOSIS — F5101 Primary insomnia: Secondary | ICD-10-CM

## 2018-12-20 ENCOUNTER — Other Ambulatory Visit: Payer: Self-pay

## 2018-12-20 ENCOUNTER — Ambulatory Visit: Payer: Medicare Other | Attending: Primary Care | Admitting: Primary Care

## 2018-12-20 DIAGNOSIS — Z760 Encounter for issue of repeat prescription: Secondary | ICD-10-CM

## 2018-12-20 DIAGNOSIS — M8000XD Age-related osteoporosis with current pathological fracture, unspecified site, subsequent encounter for fracture with routine healing: Secondary | ICD-10-CM

## 2018-12-20 DIAGNOSIS — Z1231 Encounter for screening mammogram for malignant neoplasm of breast: Secondary | ICD-10-CM | POA: Diagnosis not present

## 2018-12-20 MED ORDER — DICLOFENAC SODIUM 1 % TD GEL: 2.0000 g | Freq: Four times a day (QID) | TRANSDERMAL | 2 refills | Status: DC | PRN

## 2018-12-20 NOTE — Progress Notes (Signed)
Patient verified DOB Patient has taken medication today. Patient has eaten today. Patient complains of pain in her bones. Patient is using her Voltaren and it is providing relief.

## 2018-12-20 NOTE — Progress Notes (Signed)
Virtual Visit via Telephone Note  I connected with Jodi [name] on 12/20/18 at  2:50 PM EDT by telephone and verified that I am speaking with the correct person using two identifiers.   I discussed the limitations, risks, security and privacy concerns of performing an evaluation and management service by telephone and the availability of in person appointments. I also discussed with the patient that there may be a patient responsible charge related to this service. The patient expressed understanding and agreed to proceed.   History of Present Illness: [name] is having a telemetry visit today requesting a refill on her Voltaren gel.  She has a past medical history of anemia, prediabetes, hypertension, overactive bladder, and obesity.   Observations/Objective: Review of Systems  Constitutional: Negative.   HENT: Negative.   Eyes: Negative.   Respiratory: Negative.   Cardiovascular: Negative.   Gastrointestinal: Negative.   Genitourinary: Negative.   Musculoskeletal: Positive for myalgias.  Skin: Negative.   Neurological: Negative.   Endo/Heme/Allergies: Negative.   Psychiatric/Behavioral: Negative.     Assessment and Plan: Diagnoses and all orders for this visit:  Osteoporosis with unspecified osteoporosis type, subsequent encounter Currently on Flomax, calcium, and vit D  Chronic Pain  Managed with Voltaren gel.  Medication refill No other medications requested for refill except for  Voltaren gel. Other orders -     diclofenac sodium (VOLTAREN) 1 % GEL; Apply 2 g topically 4 (four) times daily as needed (pain).    Follow Up Instructions:    I discussed the assessment and treatment plan with the patient. The patient was provided an opportunity to ask questions and all were answered. The patient agreed with the plan and demonstrated an understanding of the instructions.   The patient was advised to call back or seek an in-person evaluation if the symptoms worsen  or if the condition fails to improve as anticipated.  I provided 17 minutes of non-face-to-face time during this encounter.   Kerin Perna, NP

## 2018-12-28 ENCOUNTER — Other Ambulatory Visit: Payer: Self-pay | Admitting: Internal Medicine

## 2018-12-28 DIAGNOSIS — K219 Gastro-esophageal reflux disease without esophagitis: Secondary | ICD-10-CM

## 2019-01-01 DIAGNOSIS — Z760 Encounter for issue of repeat prescription: Secondary | ICD-10-CM | POA: Insufficient documentation

## 2019-01-01 DIAGNOSIS — G8929 Other chronic pain: Secondary | ICD-10-CM | POA: Insufficient documentation

## 2019-01-02 ENCOUNTER — Other Ambulatory Visit: Payer: Self-pay | Admitting: Internal Medicine

## 2019-01-02 DIAGNOSIS — N6320 Unspecified lump in the left breast, unspecified quadrant: Secondary | ICD-10-CM | POA: Diagnosis not present

## 2019-01-02 DIAGNOSIS — Z803 Family history of malignant neoplasm of breast: Secondary | ICD-10-CM | POA: Diagnosis not present

## 2019-01-02 DIAGNOSIS — N6002 Solitary cyst of left breast: Secondary | ICD-10-CM | POA: Diagnosis not present

## 2019-01-03 ENCOUNTER — Ambulatory Visit: Payer: Medicare Other | Admitting: Internal Medicine

## 2019-01-10 ENCOUNTER — Encounter: Payer: Self-pay | Admitting: Internal Medicine

## 2019-01-10 ENCOUNTER — Ambulatory Visit (HOSPITAL_COMMUNITY): Admission: RE | Admit: 2019-01-10 | Payer: Medicare Other | Source: Ambulatory Visit

## 2019-01-10 ENCOUNTER — Other Ambulatory Visit: Payer: Self-pay

## 2019-01-10 ENCOUNTER — Ambulatory Visit: Payer: Medicare Other | Attending: Internal Medicine | Admitting: Internal Medicine

## 2019-01-10 VITALS — BP 144/84 | HR 71 | Temp 98.7°F | Resp 18 | Ht 60.0 in | Wt 147.0 lb

## 2019-01-10 DIAGNOSIS — D500 Iron deficiency anemia secondary to blood loss (chronic): Secondary | ICD-10-CM | POA: Insufficient documentation

## 2019-01-10 DIAGNOSIS — R7303 Prediabetes: Secondary | ICD-10-CM | POA: Insufficient documentation

## 2019-01-10 DIAGNOSIS — K219 Gastro-esophageal reflux disease without esophagitis: Secondary | ICD-10-CM | POA: Insufficient documentation

## 2019-01-10 DIAGNOSIS — Z91013 Allergy to seafood: Secondary | ICD-10-CM | POA: Insufficient documentation

## 2019-01-10 DIAGNOSIS — M810 Age-related osteoporosis without current pathological fracture: Secondary | ICD-10-CM | POA: Diagnosis not present

## 2019-01-10 DIAGNOSIS — E559 Vitamin D deficiency, unspecified: Secondary | ICD-10-CM | POA: Insufficient documentation

## 2019-01-10 DIAGNOSIS — G8929 Other chronic pain: Secondary | ICD-10-CM | POA: Insufficient documentation

## 2019-01-10 DIAGNOSIS — I803 Phlebitis and thrombophlebitis of lower extremities, unspecified: Secondary | ICD-10-CM | POA: Insufficient documentation

## 2019-01-10 DIAGNOSIS — M7989 Other specified soft tissue disorders: Secondary | ICD-10-CM | POA: Insufficient documentation

## 2019-01-10 DIAGNOSIS — Z888 Allergy status to other drugs, medicaments and biological substances status: Secondary | ICD-10-CM | POA: Diagnosis not present

## 2019-01-10 DIAGNOSIS — I10 Essential (primary) hypertension: Secondary | ICD-10-CM | POA: Insufficient documentation

## 2019-01-10 DIAGNOSIS — M79605 Pain in left leg: Secondary | ICD-10-CM | POA: Diagnosis not present

## 2019-01-10 DIAGNOSIS — N6002 Solitary cyst of left breast: Secondary | ICD-10-CM | POA: Diagnosis not present

## 2019-01-10 DIAGNOSIS — Z79899 Other long term (current) drug therapy: Secondary | ICD-10-CM | POA: Diagnosis not present

## 2019-01-10 DIAGNOSIS — Z91018 Allergy to other foods: Secondary | ICD-10-CM | POA: Diagnosis not present

## 2019-01-10 DIAGNOSIS — Z7984 Long term (current) use of oral hypoglycemic drugs: Secondary | ICD-10-CM | POA: Insufficient documentation

## 2019-01-10 MED ORDER — HYDROCHLOROTHIAZIDE 12.5 MG PO TABS: 12.5000 mg | ORAL_TABLET | Freq: Every day | ORAL | 3 refills | Status: DC

## 2019-01-10 NOTE — Progress Notes (Signed)
Patient ID: Jodi [name], female    DOB: 08-14-1945  MRN: [id number]  CC: Leg Swelling (left)   Subjective: Jodi [name] is a 73 y.o. female who presents for chronic ds  Her concerns today include:  Pt  with hx of GERD, osteoporosis, Vit D def, diverticulosis,iron def,HTN and pre-DM.  Recent MMG:  Recent MMG done through [facility] revealed benign cyst in LT breast. Next MMG due in 1 yr -she is concern that Fosamax may have caused the cyst because her sister was also taking this med and had cyst in breast.  SIster now on inj for osteoporosis ??Prolia or Boniva.  Pt would like to be change to an inj..  C/o pain and swelling in LT leg x 2 wks.  Varicose vein in calf that is tender to touch x 3 days. -no recent long distance traveling by plane or car.  No SOB -RT leg has always been smaller than LT due to polio in childhood . HTN:  Compliant with Norvasc.  No CP/SOB.  Limits salt in foods  Anemia:  Compliant with iron.  Followed by Dr. Irene Limbo.  Missed appt.  Would like to have Hb rechecked.  No dizziness, no change bowel habits.  Had EGD last yr that revealed hiatal hernia; capsular endoscopy was nl.  Pre-DM:  Does well with eating habits Compliant with Metformin.  Patient Active Problem List   Diagnosis Date Noted  . Chronic pain 01/01/2019  . Medication refill 01/01/2019  . Symptomatic anemia   . Iron deficiency anemia due to chronic blood loss 10/14/2017  . HTN (hypertension), benign 03/16/2017  . Primary insomnia 03/16/2017  . Osteoporosis 01/08/2017  . GERD (gastroesophageal reflux disease) 01/08/2017  . Vitamin D deficiency 01/08/2017  . Edema 03/13/2014     Current Outpatient Medications on File Prior to Visit  Medication Sig Dispense Refill  . ACCU-CHEK SOFTCLIX LANCETS lancets Use as instructed to test blood glucose twice daily. 100 each 12  . amLODipine (NORVASC) 5 MG tablet TAKE 1/2 TABLET POR VIA ORAL A DIARIO 15 tablet 6  . Blood Glucose Monitoring  Suppl (ACCU-CHEK AVIVA PLUS) w/Device KIT To test blood glucose twice daily. 1 kit 0  . CALCIUM PO Take 1 tablet by mouth daily.    Marland Kitchen dexlansoprazole (DEXILANT) 60 MG capsule TOME UNA CAPSULA TODOS LOS DIAS 90 capsule 0  . diclofenac sodium (VOLTAREN) 1 % GEL Apply 2 g topically 4 (four) times daily as needed (pain). 100 g 2  . Elastic Bandages & Supports (LUMBAR BACK BRACE/SUPPORT PAD) MISC 1 application by Does not apply route daily. 1 each 0  . ferrous sulfate 325 (65 FE) MG tablet Take 1 tablet (325 mg total) by mouth daily with breakfast. 90 tablet 1  . glucose blood (ACCU-CHEK AVIVA PLUS) test strip Use as instructed to test blood glucose 2 times daily. 100 each 12  . Iron-FA-B Cmp-C-Biot-Probiotic (FUSION PLUS) CAPS Take 1 tablet by mouth every other day. As tolerated 90 capsule 2  . Lancet Devices (ACCU-CHEK SOFTCLIX) lancets Use as instructed to test blood glucose twice daily. 1 each 0  . metFORMIN (GLUCOPHAGE) 500 MG tablet TOME UNA TABLETA TODOS LOS DIAS CON EL DESAYUNO 90 tablet 0  . MYRBETRIQ 25 MG TB24 tablet TAKE 1 TABLET (25 MG TOTAL) BY MOUTH DAILY. STOP DETROL. 30 tablet 2  . senna-docusate (SENOKOT-S) 8.6-50 MG tablet Take 1 tablet by mouth at bedtime as needed for mild constipation. 120 tablet 3  . Vitamin D, Cholecalciferol,  400 units CHEW Chew 2 tablets (800 Units total) by mouth daily. 120 tablet 6  . alendronate (FOSAMAX) 70 MG tablet TOME UNA TABLETA POR VIA ORAL EVERY 7 DAYS W/FULL GLASS OF WATER ON AN EMPTY STOMACH (Patient not taking: Reported on 01/10/2019) 12 tablet 0   No current facility-administered medications on file prior to visit.     Allergies  Allergen Reactions  . Cortisone     Rash, swelling  . Gabapentin Nausea Only    With dizziness  . Pork-Derived Products     Rash, joint pain  . Shellfish Allergy Rash and Other (See Comments)    joint pain, GI pain    Social History   Socioeconomic History  . Marital status: Single    Spouse name: Not on  file  . Number of children: Not on file  . Years of education: Not on file  . Highest education level: Not on file  Occupational History  . Not on file  Social Needs  . Financial resource strain: Not on file  . Food insecurity    Worry: Not on file    Inability: Not on file  . Transportation needs    Medical: Not on file    Non-medical: Not on file  Tobacco Use  . Smoking status: Never Smoker  . Smokeless tobacco: Never Used  Substance and Sexual Activity  . Alcohol use: No  . Drug use: No  . Sexual activity: Not Currently  Lifestyle  . Physical activity    Days per week: Not on file    Minutes per session: Not on file  . Stress: Not on file  Relationships  . Social Herbalist on phone: Not on file    Gets together: Not on file    Attends religious service: Not on file    Active member of club or organization: Not on file    Attends meetings of clubs or organizations: Not on file    Relationship status: Not on file  . Intimate partner violence    Fear of current or ex partner: Not on file    Emotionally abused: Not on file    Physically abused: Not on file    Forced sexual activity: Not on file  Other Topics Concern  . Not on file  Social History Narrative  . Not on file    History reviewed. No pertinent family history.  Past Surgical History:  Procedure Laterality Date  . CESAREAN SECTION    . SHOULDER SURGERY Left 2010    ROS: Review of Systems Negative except as stated above  PHYSICAL EXAM: BP (!) 144/84 (BP Location: Left Arm, Patient Position: Sitting, Cuff Size: Large)   Pulse 71   Temp 98.7 F (37.1 C) (Oral)   Resp 18   Ht 5' (1.524 m)   Wt 147 lb (66.7 kg)   SpO2 99%   BMI 28.71 kg/m   Physical Exam  General appearance - alert, well appearing, and in no distress Mental status - normal mood, behavior, speech, dress, motor activity, and thought processes Neck - supple, no significant adenopathy Chest - clear to auscultation, no  wheezes, rales or rhonchi, symmetric air entry Heart - normal rate, regular rhythm, normal S1, S2, no murmurs, rubs, clicks or gallops Extremities - RLE smaller in size compare to LT.  Trace to 1+ BL LE edema.  Moderate varicose spider veins in both legs LT>RT.  1 cm mildly tender firm area in superficial vein LT calf  posteriorally   CMP Latest Ref Rng & Units 03/25/2018 11/19/2017 10/15/2017  Glucose 70 - 99 mg/dL 108(H) 102 104(H)  BUN 8 - 23 mg/dL 15 17 13   Creatinine 0.44 - 1.00 mg/dL 0.72 0.69 0.59  Sodium 135 - 145 mmol/L 142 140 144  Potassium 3.5 - 5.1 mmol/L 4.1 4.7 4.0  Chloride 98 - 111 mmol/L 107 109 110  CO2 22 - 32 mmol/L 25 26 26   Calcium 8.9 - 10.3 mg/dL 9.6 9.2 9.1  Total Protein 6.5 - 8.1 g/dL 7.5 7.6 6.7  Total Bilirubin 0.3 - 1.2 mg/dL 0.6 0.4 2.0(H)  Alkaline Phos 38 - 126 U/L 74 80 74  AST 15 - 41 U/L 19 21 24   ALT 0 - 44 U/L 13 15 19    Lipid Panel     Component Value Date/Time   CHOL 185 09/28/2017 1628   TRIG 128 09/28/2017 1628   HDL 65 09/28/2017 1628   CHOLHDL 2.8 09/28/2017 1628   CHOLHDL 2.8 04/02/2016 1042   VLDL 21 04/02/2016 1042   LDLCALC 94 09/28/2017 1628    CBC    Component Value Date/Time   WBC 6.0 08/29/2018 1645   WBC 4.4 03/25/2018 0912   WBC 5.1 10/15/2017 0820   RBC 4.01 08/29/2018 1645   RBC 4.16 03/25/2018 0912   RBC 4.15 03/25/2018 0912   HGB 12.1 08/29/2018 1645   HCT 36.0 08/29/2018 1645   PLT 400 08/29/2018 1645   MCV 90 08/29/2018 1645   MCH 30.2 08/29/2018 1645   MCH 30.6 03/25/2018 0912   MCHC 33.6 08/29/2018 1645   MCHC 32.6 03/25/2018 0912   RDW 12.7 08/29/2018 1645   LYMPHSABS 1.3 03/25/2018 0912   MONOABS 0.5 03/25/2018 0912   EOSABS 0.2 03/25/2018 0912   BASOSABS 0.1 03/25/2018 0912    ASSESSMENT AND PLAN: 1. Essential hypertension Not at goal. Continue Norvasc.  Add HCTZ - hydrochlorothiazide (HYDRODIURIL) 12.5 MG tablet; Take 1 tablet (12.5 mg total) by mouth daily.  Dispense: 90 tablet; Refill: 3 - CBC;  Future - Comprehensive metabolic panel; Future  2. Prediabetes Continue healthy eating habits; regular exercise encouraged Cont Metformin - Lipid panel; Future  3. Cyst of left breast Doubt this was caused by Fosamax  4. Age-related osteoporosis without current pathological fracture Pt has been on Fosamax for about 2 yrs,  Though tolerating, she wants to be changed to an inj drug that she takes less often.  Will refer to endocrine - Ambulatory referral to Endocrinology  5. Pain of left lower extremity - VAS [place] LOWER EXTREMITY VENOUS (DVT); Future  6. Left leg swelling Doppler US to be done today - VAS [place] LOWER EXTREMITY VENOUS (DVT); Future  7. Phlebitis of left leg Ideally would prescribe ASA or NSAID but pt has hx of occult GI bleed resulting in blood and iron transfusion so will hold off on prescribing.  Recommend warm compresses to area and leg elevation   Patient was given the opportunity to ask questions.  Patient verbalized understanding of the plan and was able to repeat key elements of the plan.  [employer] interpreter used during this encounter. #[id number]   Orders Placed This Encounter  Procedures  . Lipid panel  . CBC  . Comprehensive metabolic panel  . Ambulatory referral to Endocrinology     Requested Prescriptions   Signed Prescriptions Disp Refills  . hydrochlorothiazide (HYDRODIURIL) 12.5 MG tablet 90 tablet 3    Sig: Take 1 tablet (12.5 mg total) by mouth daily.  Return in about 3 months (around 04/12/2019).  Karle Plumber, MD, FACP

## 2019-01-12 ENCOUNTER — Other Ambulatory Visit: Payer: Self-pay | Admitting: Internal Medicine

## 2019-01-28 ENCOUNTER — Other Ambulatory Visit: Payer: Self-pay | Admitting: Internal Medicine

## 2019-01-28 DIAGNOSIS — M810 Age-related osteoporosis without current pathological fracture: Secondary | ICD-10-CM

## 2019-02-13 ENCOUNTER — Other Ambulatory Visit: Payer: Self-pay | Admitting: Internal Medicine

## 2019-03-03 ENCOUNTER — Other Ambulatory Visit: Payer: Self-pay

## 2019-03-07 ENCOUNTER — Other Ambulatory Visit: Payer: Self-pay

## 2019-03-07 ENCOUNTER — Ambulatory Visit (INDEPENDENT_AMBULATORY_CARE_PROVIDER_SITE_OTHER): Payer: Medicare Other | Admitting: Internal Medicine

## 2019-03-07 ENCOUNTER — Encounter: Payer: Self-pay | Admitting: Internal Medicine

## 2019-03-07 VITALS — BP 126/82 | HR 84 | Temp 98.1°F | Ht 60.0 in | Wt 149.8 lb

## 2019-03-07 DIAGNOSIS — M810 Age-related osteoporosis without current pathological fracture: Secondary | ICD-10-CM | POA: Diagnosis not present

## 2019-03-07 NOTE — Patient Instructions (Signed)
-   Calcium Citrate 1000 mg daily  - Vitamin D 1000 iu daily

## 2019-03-07 NOTE — Progress Notes (Signed)
Name: Jodi [name]  MRN/ DOB: [id number], [birth year]    Age/ Sex: 73 y.o., female    PCP: Ladell Pier, MD   Reason for Endocrinology Evaluation: Osteoporosis      Date of Initial Endocrinology Evaluation: 03/07/2019     HPI: Jodi [name] is a 73 y.o. female with a past medical history of GERD, Osteoporosis and HTN. The patient presented for initial endocrinology clinic visit on 03/07/2019 for consultative assistance with her osteoporosis.   Pt was diagnosed with osteoporosis:2018 Menarche at age : 36 Menopausal at age : 31 Fracture Hx: no Hx of HRT: no FH of osteoporosis or hip fracture: No Prior Hx of anti-estrogenic therapy : no Prior Hx of anti-resorptive therapy : Fosamax in 2018  GERD well controlled on Dexilant   Calcium 1 tablet daily  Vitamin D 400 iu daily     HISTORY:  Past Medical History:  Past Medical History:  Diagnosis Date  . Anemia   . Diabetes mellitus without complication ([place])   . Hypertension   . Insomnia   . Polio    in childhood   Past Surgical History:  Past Surgical History:  Procedure Laterality Date  . CESAREAN SECTION    . SHOULDER SURGERY Left 2010      Social History:  reports that she has never smoked. She has never used smokeless tobacco. She reports that she does not drink alcohol or use drugs.  Family History: family history is not on file.   HOME MEDICATIONS: Allergies as of 03/07/2019      Reactions   Cortisone    Rash, swelling   Gabapentin Nausea Only   With dizziness   Pork-derived Products    Rash, joint pain   Shellfish Allergy Rash, Other (See Comments)   joint pain, GI pain      Medication List       Accurate as of March 07, 2019  1:49 PM. If you have any questions, ask your nurse or doctor.        Accu-Chek Aviva Plus w/Device Kit To test blood glucose twice daily.   accu-chek softclix lancets Use as instructed to test blood glucose twice daily.   Accu-Chek Softclix  Lancets lancets Use as instructed to test blood glucose twice daily.   alendronate 70 MG tablet Commonly known as: FOSAMAX TOME UNA TABLETA POR VIA ORAL EVERY 7 DAYS W/FULL GLASS OF WATER ON AN EMPTY STOMACH   amLODipine 5 MG tablet Commonly known as: NORVASC TAKE 1/2 TABLET POR VIA ORAL A DIARIO   CALCIUM PO Take 1 tablet by mouth daily.   Dexilant 60 MG capsule Generic drug: dexlansoprazole TOME UNA CAPSULA TODOS LOS DIAS   diclofenac sodium 1 % Gel Commonly known as: VOLTAREN Apply 2 g topically 4 (four) times daily as needed (pain).   ferrous sulfate 325 (65 FE) MG tablet Take 1 tablet (325 mg total) by mouth daily with breakfast.   Fusion Plus Caps Take 1 tablet by mouth every other day. As tolerated   glucose blood test strip Commonly known as: Accu-Chek Aviva Plus Use as instructed to test blood glucose 2 times daily.   hydrochlorothiazide 12.5 MG tablet Commonly known as: HYDRODIURIL Take 1 tablet (12.5 mg total) by mouth daily.   Lumbar Back Brace/Support Pad Misc 1 application by Does not apply route daily.   metFORMIN 500 MG tablet Commonly known as: GLUCOPHAGE TOME UNA TABLETA TODOS LOS DIAS CON EL DESAYUNO   Myrbetriq 25  MG Tb24 tablet Generic drug: mirabegron ER TAKE 1 TABLET (25 MG TOTAL) BY MOUTH DAILY. STOP DETROL.   senna-docusate 8.6-50 MG tablet Commonly known as: Senokot-S Take 1 tablet by mouth at bedtime as needed for mild constipation.   Vitamin D (Cholecalciferol) 10 MCG (400 UNIT) Chew Chew 2 tablets (800 Units total) by mouth daily.         REVIEW OF SYSTEMS: A comprehensive ROS was conducted with the patient and is negative except as per HPI and below:  ROS     OBJECTIVE:  VS: BP 126/82 (BP Location: Right Arm, Patient Position: Sitting, Cuff Size: Normal)   Pulse 84   Temp 98.1 F (36.7 C)   Ht 5' (1.524 m)   Wt 149 lb 12.8 oz (67.9 kg)   SpO2 94%   BMI 29.26 kg/m    Wt Readings from Last 3 Encounters:  03/07/19  149 lb 12.8 oz (67.9 kg)  01/10/19 147 lb (66.7 kg)  08/29/18 156 lb 6.4 oz (70.9 kg)     EXAM: General: Pt appears well and is in NAD  Hydration: Well-hydrated with moist mucous membranes and good skin turgor  Eyes: External eye exam normal without stare, lid lag or exophthalmos.  EOM intact.    Ears, Nose, Throat: Hearing: Grossly intact bilaterally Dental: Good dentition  Throat: Clear without mass, erythema or exudate  Neck: General: Supple without adenopathy. Thyroid: Thyroid size normal.  No goiter or nodules appreciated. No thyroid bruit.  Lungs: Clear with good BS bilat with no rales, rhonchi, or wheezes  Heart: Auscultation: RRR.  Abdomen: Normoactive bowel sounds, soft, nontender, without masses or organomegaly palpable  Extremities:  BL LE: No pretibial edema normal ROM and strength.  Skin: Hair: Texture and amount normal with gender appropriate distribution Skin Inspection: No rashes Skin Palpation: Skin temperature, texture, and thickness normal to palpation  Neuro: Cranial nerves: II - XII grossly intact  Motor: Normal strength throughout DTRs: 2+ and symmetric in UE without delay in relaxation phase  Mental Status: Judgment, insight: Intact Orientation: Oriented to time, place, and person Mood and affect: No depression, anxiety, or agitation     DATA REVIEWED:   Old records , labs and images have been reviewed.    ASSESSMENT/PLAN/RECOMMENDATIONS:   1. Osteoporosis :    - Pt is concerned about oral Alendronate causing cancer, she wanted to to switch to the infusion.  - I have reassured her that Alendronate has not been associated with breast ca, we discussed that oral and IV bisphosphonates carry the same side effects, if anything the infusion formulary carries also a risk of infection and inflammation at the infusion sites. We discussed criteria for switching from oral to IV such as uncontrolled GERD - Pt agreed to continue with oral alendronate - We  discussed rare side effect of atypical fractures and osteonecrosis. Pt encouraged to have an updated dental exam - I would recommend repeating DXA in 2023 ( which would be 5 yrs from starting fosamax) and if BMD has improved to consider a drug holiday.  - I also have advised to switch to calcium citrate as it has better absorption profile in the setting of chronic PPI use.    Medications : Alendronate 70 mg weekly  Calcium Citrate 1000 mg daily  Vitamin D 1000 iu daily   F/U PRN     Signed electronically by: Mack Guise, MD  [facility] Endocrinology  [facility] [address] [place], [place]  [address] Phone: [phone] FAX: [phone]   CC: Ladell Pier, MD [place] [address] Phone: [phone] Fax: [phone]   Return to Endocrinology clinic as below: No future appointments.

## 2019-03-08 DIAGNOSIS — M810 Age-related osteoporosis without current pathological fracture: Secondary | ICD-10-CM | POA: Insufficient documentation

## 2019-03-24 ENCOUNTER — Other Ambulatory Visit: Payer: Self-pay | Admitting: Internal Medicine

## 2019-03-24 DIAGNOSIS — K219 Gastro-esophageal reflux disease without esophagitis: Secondary | ICD-10-CM

## 2019-03-29 ENCOUNTER — Other Ambulatory Visit: Payer: Self-pay | Admitting: Internal Medicine

## 2019-03-29 DIAGNOSIS — D500 Iron deficiency anemia secondary to blood loss (chronic): Secondary | ICD-10-CM

## 2019-03-30 ENCOUNTER — Other Ambulatory Visit: Payer: Self-pay | Admitting: Internal Medicine

## 2019-04-13 ENCOUNTER — Other Ambulatory Visit: Payer: Self-pay | Admitting: Internal Medicine

## 2019-04-20 ENCOUNTER — Other Ambulatory Visit: Payer: Self-pay | Admitting: Internal Medicine

## 2019-04-20 DIAGNOSIS — M810 Age-related osteoporosis without current pathological fracture: Secondary | ICD-10-CM

## 2019-04-24 DIAGNOSIS — Z23 Encounter for immunization: Secondary | ICD-10-CM | POA: Diagnosis not present

## 2019-05-07 ENCOUNTER — Other Ambulatory Visit: Payer: Self-pay | Admitting: Internal Medicine

## 2019-05-10 ENCOUNTER — Telehealth: Payer: Self-pay | Admitting: Internal Medicine

## 2019-05-10 NOTE — Telephone Encounter (Signed)
Patient called stating she was told by the pharmacy that PCP has to do a prior auth for a medication but is not sure which medication is needing the prior auth. Please follow up.

## 2019-05-10 NOTE — Telephone Encounter (Signed)
Kelly would you be able to look into this  °

## 2019-05-12 ENCOUNTER — Other Ambulatory Visit: Payer: Self-pay

## 2019-05-12 ENCOUNTER — Ambulatory Visit: Payer: Medicare Other | Attending: Internal Medicine

## 2019-05-12 DIAGNOSIS — R7303 Prediabetes: Secondary | ICD-10-CM

## 2019-05-12 DIAGNOSIS — I10 Essential (primary) hypertension: Secondary | ICD-10-CM | POA: Diagnosis not present

## 2019-05-13 ENCOUNTER — Other Ambulatory Visit: Payer: Self-pay | Admitting: Internal Medicine

## 2019-05-13 DIAGNOSIS — E785 Hyperlipidemia, unspecified: Secondary | ICD-10-CM | POA: Insufficient documentation

## 2019-05-13 DIAGNOSIS — E7800 Pure hypercholesterolemia, unspecified: Secondary | ICD-10-CM

## 2019-05-13 LAB — CBC
Hematocrit: 34.5 % (ref 34.0–46.6)
Hemoglobin: 11.3 g/dL (ref 11.1–15.9)
MCH: 29.6 pg (ref 26.6–33.0)
MCHC: 32.8 g/dL (ref 31.5–35.7)
MCV: 90 fL (ref 79–97)
Platelets: 419 10*3/uL (ref 150–450)
RBC: 3.82 x10E6/uL (ref 3.77–5.28)
RDW: 13.9 % (ref 11.7–15.4)
WBC: 5.6 10*3/uL (ref 3.4–10.8)

## 2019-05-13 LAB — COMPREHENSIVE METABOLIC PANEL
ALT: 16 IU/L (ref 0–32)
AST: 19 IU/L (ref 0–40)
Albumin/Globulin Ratio: 1.5 (ref 1.2–2.2)
Albumin: 4.2 g/dL (ref 3.7–4.7)
Alkaline Phosphatase: 86 IU/L (ref 39–117)
BUN/Creatinine Ratio: 25 (ref 12–28)
BUN: 16 mg/dL (ref 8–27)
Bilirubin Total: 0.5 mg/dL (ref 0.0–1.2)
CO2: 24 mmol/L (ref 20–29)
Calcium: 9.3 mg/dL (ref 8.7–10.3)
Chloride: 104 mmol/L (ref 96–106)
Creatinine, Ser: 0.63 mg/dL (ref 0.57–1.00)
GFR calc Af Amer: 101 mL/min/{1.73_m2} (ref >59)
GFR calc non Af Amer: 88 mL/min/{1.73_m2} (ref >59)
Globulin, Total: 2.8 g/dL (ref 1.5–4.5)
Glucose: 102 mg/dL — ABNORMAL HIGH (ref 65–99)
Potassium: 4.3 mmol/L (ref 3.5–5.2)
Sodium: 139 mmol/L (ref 134–144)
Total Protein: 7 g/dL (ref 6.0–8.5)

## 2019-05-13 LAB — LIPID PANEL
Chol/HDL Ratio: 3.8 ratio (ref 0.0–4.4)
Cholesterol, Total: 238 mg/dL — ABNORMAL HIGH (ref 100–199)
HDL: 63 mg/dL (ref >39)
LDL Chol Calc (NIH): 148 mg/dL — ABNORMAL HIGH (ref 0–99)
Triglycerides: 152 mg/dL — ABNORMAL HIGH (ref 0–149)
VLDL Cholesterol Cal: 27 mg/dL (ref 5–40)

## 2019-05-13 MED ORDER — ATORVASTATIN CALCIUM 10 MG PO TABS: 10.0000 mg | ORAL_TABLET | Freq: Every day | ORAL | 3 refills | Status: DC

## 2019-05-16 ENCOUNTER — Telehealth: Payer: Self-pay

## 2019-05-16 NOTE — Telephone Encounter (Signed)
Clifford Id# [id number]  contacted pt to go over lab results pt didn't answer left a detailed vm informing pt of results and if she has any questions or concerns to give me a call

## 2019-05-16 NOTE — Telephone Encounter (Signed)
Left a message with [facility] to call me back.  I don't have and prior auth's for pt, so I left message with [facility] to call me back with prior auth info.  05/16/19 2pm

## 2019-05-24 ENCOUNTER — Telehealth: Payer: Self-pay | Admitting: Internal Medicine

## 2019-05-24 NOTE — Telephone Encounter (Signed)
Patient called stating she received a call in regards to her mammogram. Please Follow up

## 2019-05-26 NOTE — Telephone Encounter (Signed)
I don't see anything in the char regarding mm results

## 2019-05-30 ENCOUNTER — Other Ambulatory Visit: Payer: Self-pay | Admitting: Internal Medicine

## 2019-05-30 DIAGNOSIS — I10 Essential (primary) hypertension: Secondary | ICD-10-CM

## 2019-06-11 ENCOUNTER — Other Ambulatory Visit: Payer: Self-pay | Admitting: Internal Medicine

## 2019-06-15 ENCOUNTER — Other Ambulatory Visit: Payer: Self-pay | Admitting: Internal Medicine

## 2019-06-15 DIAGNOSIS — K219 Gastro-esophageal reflux disease without esophagitis: Secondary | ICD-10-CM

## 2019-06-23 DIAGNOSIS — M25562 Pain in left knee: Secondary | ICD-10-CM | POA: Diagnosis not present

## 2019-07-06 ENCOUNTER — Other Ambulatory Visit: Payer: Self-pay | Admitting: Internal Medicine

## 2019-07-06 NOTE — Telephone Encounter (Signed)
Please refill if appropriate to continue the use

## 2019-07-07 ENCOUNTER — Other Ambulatory Visit: Payer: Self-pay | Admitting: Internal Medicine

## 2019-07-07 DIAGNOSIS — D500 Iron deficiency anemia secondary to blood loss (chronic): Secondary | ICD-10-CM

## 2019-07-09 ENCOUNTER — Other Ambulatory Visit: Payer: Self-pay | Admitting: Internal Medicine

## 2019-07-10 ENCOUNTER — Other Ambulatory Visit: Payer: Self-pay | Admitting: Internal Medicine

## 2019-07-10 DIAGNOSIS — M810 Age-related osteoporosis without current pathological fracture: Secondary | ICD-10-CM

## 2019-07-20 ENCOUNTER — Other Ambulatory Visit: Payer: Self-pay | Admitting: Internal Medicine

## 2019-07-20 DIAGNOSIS — K219 Gastro-esophageal reflux disease without esophagitis: Secondary | ICD-10-CM

## 2019-07-27 ENCOUNTER — Ambulatory Visit: Payer: Medicare Other | Admitting: Internal Medicine

## 2019-08-06 ENCOUNTER — Other Ambulatory Visit: Payer: Self-pay | Admitting: Internal Medicine

## 2019-08-06 DIAGNOSIS — M810 Age-related osteoporosis without current pathological fracture: Secondary | ICD-10-CM

## 2019-08-08 ENCOUNTER — Other Ambulatory Visit: Payer: Self-pay | Admitting: Internal Medicine

## 2019-08-25 ENCOUNTER — Other Ambulatory Visit: Payer: Self-pay | Admitting: Internal Medicine

## 2019-08-25 DIAGNOSIS — I10 Essential (primary) hypertension: Secondary | ICD-10-CM

## 2019-08-28 ENCOUNTER — Other Ambulatory Visit: Payer: Self-pay | Admitting: Internal Medicine

## 2019-08-28 DIAGNOSIS — I10 Essential (primary) hypertension: Secondary | ICD-10-CM

## 2019-08-31 ENCOUNTER — Telehealth: Payer: Self-pay | Admitting: Internal Medicine

## 2019-08-31 DIAGNOSIS — D500 Iron deficiency anemia secondary to blood loss (chronic): Secondary | ICD-10-CM

## 2019-08-31 MED ORDER — VITAMIN D (CHOLECALCIFEROL) 10 MCG (400 UNIT) PO CHEW: 800.0000 [IU] | CHEWABLE_TABLET | Freq: Every day | ORAL | 0 refills | Status: DC

## 2019-08-31 MED ORDER — DICLOFENAC SODIUM 1 % EX GEL: 2.0000 g | Freq: Four times a day (QID) | CUTANEOUS | 0 refills | Status: DC

## 2019-08-31 MED ORDER — FERROUS SULFATE 325 (65 FE) MG PO TABS: ORAL_TABLET | ORAL | 0 refills | Status: DC

## 2019-08-31 NOTE — Telephone Encounter (Signed)
1) Medication(s) Requested (by name): -ferrous sulfate 325 (65 FE) MG tablet  -diclofenac sodium (VOLTAREN) 1 % GEL  -Vitamin D  2) Pharmacy of Choice: [id number] [place], [place]

## 2019-08-31 NOTE — Telephone Encounter (Signed)
RF sent to cover until next appointment.

## 2019-09-07 ENCOUNTER — Other Ambulatory Visit: Payer: Self-pay | Admitting: Internal Medicine

## 2019-09-09 ENCOUNTER — Ambulatory Visit: Payer: Self-pay | Attending: Internal Medicine

## 2019-09-09 DIAGNOSIS — Z23 Encounter for immunization: Secondary | ICD-10-CM | POA: Insufficient documentation

## 2019-09-09 NOTE — Progress Notes (Signed)
   Covid-19 Vaccination Clinic  Name:  Jodi [name]    MRN: [id number] DOB: December 26, 1945  09/09/2019  [name] was observed post Covid-19 immunization for 15 minutes without incidence. She was provided with Vaccine Information Sheet and instruction to access the V-Safe system.   [name] was instructed to call 911 with any severe reactions post vaccine: Marland Kitchen Difficulty breathing  . Swelling of your face and throat  . A fast heartbeat  . A bad rash all over your body  . Dizziness and weakness    Immunizations Administered    Name Date Dose VIS Date Route   Pfizer COVID-19 Vaccine 09/09/2019 11:50 AM 0.3 mL 06/30/2019 Intramuscular   Manufacturer: [place]   Lot: [id number]   [place]: [id number]

## 2019-09-15 ENCOUNTER — Other Ambulatory Visit: Payer: Self-pay

## 2019-09-15 ENCOUNTER — Ambulatory Visit: Payer: Medicare HMO | Attending: Internal Medicine | Admitting: Internal Medicine

## 2019-09-15 DIAGNOSIS — D500 Iron deficiency anemia secondary to blood loss (chronic): Secondary | ICD-10-CM | POA: Diagnosis not present

## 2019-09-15 DIAGNOSIS — K219 Gastro-esophageal reflux disease without esophagitis: Secondary | ICD-10-CM | POA: Diagnosis not present

## 2019-09-15 DIAGNOSIS — M810 Age-related osteoporosis without current pathological fracture: Secondary | ICD-10-CM | POA: Diagnosis not present

## 2019-09-15 DIAGNOSIS — M25562 Pain in left knee: Secondary | ICD-10-CM

## 2019-09-15 DIAGNOSIS — I10 Essential (primary) hypertension: Secondary | ICD-10-CM | POA: Diagnosis not present

## 2019-09-15 DIAGNOSIS — R7303 Prediabetes: Secondary | ICD-10-CM | POA: Diagnosis not present

## 2019-09-15 MED ORDER — ALENDRONATE SODIUM 70 MG PO TABS: 70.0000 mg | ORAL_TABLET | ORAL | 3 refills | Status: DC

## 2019-09-15 MED ORDER — DEXILANT 60 MG PO CPDR: DELAYED_RELEASE_CAPSULE | ORAL | 5 refills | Status: DC

## 2019-09-15 MED ORDER — VITAMIN D (CHOLECALCIFEROL) 10 MCG (400 UNIT) PO CHEW: 800.0000 [IU] | CHEWABLE_TABLET | Freq: Every day | ORAL | 6 refills | Status: DC

## 2019-09-15 MED ORDER — METFORMIN HCL 500 MG PO TABS: 500.0000 mg | ORAL_TABLET | Freq: Every day | ORAL | 0 refills | Status: DC

## 2019-09-15 MED ORDER — MIRABEGRON ER 25 MG PO TB24: ORAL_TABLET | ORAL | 5 refills | Status: DC

## 2019-09-15 MED ORDER — DICLOFENAC SODIUM 1 % EX GEL: 2.0000 g | Freq: Four times a day (QID) | CUTANEOUS | 3 refills | Status: DC

## 2019-09-15 MED ORDER — TRAMADOL HCL 50 MG PO TABS: 50.0000 mg | ORAL_TABLET | Freq: Two times a day (BID) | ORAL | 0 refills | Status: DC | PRN

## 2019-09-15 MED ORDER — ATORVASTATIN CALCIUM 10 MG PO TABS: ORAL_TABLET | ORAL | 1 refills | Status: DC

## 2019-09-15 MED ORDER — HYDROCHLOROTHIAZIDE 12.5 MG PO TABS: 12.5000 mg | ORAL_TABLET | Freq: Every day | ORAL | 3 refills | Status: DC

## 2019-09-15 MED ORDER — FERROUS SULFATE 325 (65 FE) MG PO TABS: ORAL_TABLET | ORAL | 1 refills | Status: DC

## 2019-09-15 MED ORDER — AMLODIPINE BESYLATE 5 MG PO TABS: 2.5000 mg | ORAL_TABLET | Freq: Every day | ORAL | 3 refills | Status: DC

## 2019-09-15 NOTE — Progress Notes (Signed)
Pt states she is feeling a lot of pain in her body   Pt states she gets injections but doesn't help

## 2019-09-15 NOTE — Addendum Note (Signed)
Addended by: Karle Plumber B on: 09/15/2019 05:48 PM   Modules accepted: Level of Service

## 2019-09-15 NOTE — Progress Notes (Signed)
Virtual Visit via Telephone Note Due to current restrictions/limitations of in-office visits due to the COVID-19 pandemic, this scheduled clinical appointment was converted to a telehealth visit  I connected with Jodi [name] on 09/15/19 at 5:17 p.m by telephone and verified that I am speaking with the correct person using two identifiers. I am in my office.  The patient is at home.  Only the patient,myself  And Wilmar from [facility] interpreters  [phone]) participated in this encounter.  I discussed the limitations, risks, security and privacy concerns of performing an evaluation and management service by telephone and the availability of in person appointments. I also discussed with the patient that there may be a patient responsible charge related to this service. The patient expressed understanding and agreed to proceed.   History of Present Illness: Pt  with hx of GERD, osteoporosis, Vit D def, diverticulosis,iron def,HTN, HL and pre-DM. Last evaluated 12/2018  C/o pain and swelling in LT knee that started several wks ago.  Seen in ER x 2 for the same and was given steroid inj on last visit which was in January.  Placed on Naprosyn and told to request refer to ortho from PCP before effect of steroid inj wears off. Reports having x-ray done which "showed ligament distension" Reports the inj has helped some but "when I walk it starts to hurt." -no injury to knee.  No falls Wants referral to ortho  HL:  Lipid profile and ASCVD score elev on last blood test.  Started Lipitor.  She is taking and tolerating  HTN: no device to check BP Taking the Norvasc and HCTZ No CP/SOB  Prediabetes: Taking and tolerating Metformin.  Osteoporosis: She is taking and tolerating Fosamax.  She did see the endocrinologist.  She is also on vitamin D supplement.  Needing refills on most of her medications.  HM: Reports having had Pfizer Covid vaccine a few days ago.  Will be due for second shot on  March 15.    Current Outpatient Medications on File Prior to Visit  Medication Sig Dispense Refill  . ACCU-CHEK SOFTCLIX LANCETS lancets Use as instructed to test blood glucose twice daily. 100 each 12  . alendronate (FOSAMAX) 70 MG tablet Take 1 tablet (70 mg total) by mouth once a week. Please make PCP appointment. 4 tablet 0  . amLODipine (NORVASC) 5 MG tablet TAKE 1/2 TABLET POR VIA ORAL A DIARIO 45 tablet 0  . atorvastatin (LIPITOR) 10 MG tablet TOME UNA TABLETA TODOS LOS DIAS 90 tablet 1  . Blood Glucose Monitoring Suppl (ACCU-CHEK AVIVA PLUS) w/Device KIT To test blood glucose twice daily. 1 kit 0  . CALCIUM PO Take 1 tablet by mouth daily.    Marland Kitchen dexlansoprazole (DEXILANT) 60 MG capsule TOME UNA CAPSULA TODOS LOS DIAS. Please make office visit. 30 capsule 0  . diclofenac Sodium (VOLTAREN) 1 % GEL Apply 2 g topically 4 (four) times daily. 100 g 0  . Elastic Bandages & Supports (LUMBAR BACK BRACE/SUPPORT PAD) MISC 1 application by Does not apply route daily. 1 each 0  . ferrous sulfate 325 (65 FE) MG tablet TOME UNA TABLETA TODOS LOS DIAS CON EL DESAYUNO 30 tablet 0  . glucose blood (ACCU-CHEK AVIVA PLUS) test strip Use as instructed to test blood glucose 2 times daily. 100 each 12  . hydrochlorothiazide (HYDRODIURIL) 12.5 MG tablet Take 1 tablet (12.5 mg total) by mouth daily. 90 tablet 3  . Iron-FA-B Cmp-C-Biot-Probiotic (FUSION PLUS) CAPS Take 1 tablet by mouth every  other day. As tolerated 90 capsule 2  . Lancet Devices (ACCU-CHEK SOFTCLIX) lancets Use as instructed to test blood glucose twice daily. 1 each 0  . metFORMIN (GLUCOPHAGE) 500 MG tablet TAKE 1 TABLET (500 MG TOTAL) BY MOUTH DAILY. PLEASE MAKE PCP APPOINTMENT. 30 tablet 0  . MYRBETRIQ 25 MG TB24 tablet TAKE 1 TABLET (25 MG TOTAL) BY MOUTH DAILY. STOP DETROL. 30 tablet 5  . senna-docusate (SENOKOT-S) 8.6-50 MG tablet Take 1 tablet by mouth at bedtime as needed for mild constipation. 120 tablet 3  . Vitamin D, Cholecalciferol,  10 MCG (400 UNIT) CHEW Chew 2 tablets (800 Units total) by mouth daily. 60 tablet 0   No current facility-administered medications on file prior to visit.    Observations/Objective: Results for orders placed or performed in visit on 05/12/19  Comprehensive metabolic panel  Result Value Ref Range   Glucose 102 (H) 65 - 99 mg/dL   BUN 16 8 - 27 mg/dL   Creatinine, Ser 0.63 0.57 - 1.00 mg/dL   GFR calc non Af Amer 88 >59 mL/min/1.73   GFR calc Af Amer 101 >59 mL/min/1.73   BUN/Creatinine Ratio 25 12 - 28   Sodium 139 134 - 144 mmol/L   Potassium 4.3 3.5 - 5.2 mmol/L   Chloride 104 96 - 106 mmol/L   CO2 24 20 - 29 mmol/L   Calcium 9.3 8.7 - 10.3 mg/dL   Total Protein 7.0 6.0 - 8.5 g/dL   Albumin 4.2 3.7 - 4.7 g/dL   Globulin, Total 2.8 1.5 - 4.5 g/dL   Albumin/Globulin Ratio 1.5 1.2 - 2.2   Bilirubin Total 0.5 0.0 - 1.2 mg/dL   Alkaline Phosphatase 86 39 - 117 IU/L   AST 19 0 - 40 IU/L   ALT 16 0 - 32 IU/L  CBC  Result Value Ref Range   WBC 5.6 3.4 - 10.8 x10E3/uL   RBC 3.82 3.77 - 5.28 x10E6/uL   Hemoglobin 11.3 11.1 - 15.9 g/dL   Hematocrit 34.5 34.0 - 46.6 %   MCV 90 79 - 97 fL   MCH 29.6 26.6 - 33.0 pg   MCHC 32.8 31.5 - 35.7 g/dL   RDW 13.9 11.7 - 15.4 %   Platelets 419 150 - 450 x10E3/uL  Lipid panel  Result Value Ref Range   Cholesterol, Total 238 (H) 100 - 199 mg/dL   Triglycerides 152 (H) 0 - 149 mg/dL   HDL 63 >39 mg/dL   VLDL Cholesterol Cal 27 5 - 40 mg/dL   LDL Chol Calc (NIH) 148 (H) 0 - 99 mg/dL   Chol/HDL Ratio 3.8 0.0 - 4.4 ratio     Assessment and Plan: 1. Acute pain of left knee Advised to stop the Naprosyn given that she is on Fosamax and has had occult GI bleed in the past. Will give limited supply of tramadol to use as needed.  Advised patient that the medication can cause drowsiness and to take only when needed.  [place] controlled substance reporting system reviewed. - Ambulatory referral to Orthopedic Surgery  2. Essential  hypertension - hydrochlorothiazide (HYDRODIURIL) 12.5 MG tablet; Take 1 tablet (12.5 mg total) by mouth daily.  Dispense: 90 tablet; Refill: 3 - amLODipine (NORVASC) 5 MG tablet; Take 0.5 tablets (2.5 mg total) by mouth daily.  Dispense: 45 tablet; Refill: 3  3. Prediabetes Continue Metformin - Microalbumin / creatinine urine ratio; Future - Hemoglobin A1c; Future  4. Iron deficiency anemia due to chronic blood loss - ferrous  sulfate 325 (65 FE) MG tablet; TOME UNA TABLETA TODOS LOS DIAS CON EL DESAYUNO  Dispense: 60 tablet; Refill: 1  5. Gastroesophageal reflux disease without esophagitis - dexlansoprazole (DEXILANT) 60 MG capsule; TOME UNA CAPSULA TODOS LOS DIAS.  Dispense: 30 capsule; Refill: 5  6. Age-related osteoporosis without current pathological fracture - alendronate (FOSAMAX) 70 MG tablet; Take 1 tablet (70 mg total) by mouth once a week.  Dispense: 4 tablet; Refill: 3   Follow Up Instructions: 4 mths   I discussed the assessment and treatment plan with the patient. The patient was provided an opportunity to ask questions and all were answered. The patient agreed with the plan and demonstrated an understanding of the instructions.   The patient was advised to call back or seek an in-person evaluation if the symptoms worsen or if the condition fails to improve as anticipated.  I provided 15 minutes of non-face-to-face time during this encounter.   Karle Plumber, MD

## 2019-09-20 ENCOUNTER — Encounter: Payer: Self-pay | Admitting: Physician Assistant

## 2019-09-20 ENCOUNTER — Ambulatory Visit (INDEPENDENT_AMBULATORY_CARE_PROVIDER_SITE_OTHER): Payer: Medicare HMO | Admitting: Physician Assistant

## 2019-09-20 ENCOUNTER — Ambulatory Visit (INDEPENDENT_AMBULATORY_CARE_PROVIDER_SITE_OTHER): Payer: Medicare HMO

## 2019-09-20 ENCOUNTER — Other Ambulatory Visit: Payer: Self-pay

## 2019-09-20 DIAGNOSIS — M25562 Pain in left knee: Secondary | ICD-10-CM

## 2019-09-20 MED ORDER — METHYLPREDNISOLONE ACETATE 40 MG/ML IJ SUSP
40.0000 mg | INTRAMUSCULAR | Status: AC | PRN
  Administered 2019-09-20: 40 mg via INTRA_ARTICULAR

## 2019-09-20 MED ORDER — LIDOCAINE HCL 1 % IJ SOLN
3.0000 mL | INTRAMUSCULAR | Status: AC | PRN
  Administered 2019-09-20: 3 mL

## 2019-09-20 NOTE — Progress Notes (Signed)
Office Visit Note   Patient: Jodi [name]           Date of Birth: [birth year]           MRN: [id number] Visit Date: 09/20/2019              Requested by: Ladell Pier, MD [address] [place],  [place] [address] PCP: Ladell Pier, MD   Assessment & Plan: Visit Diagnoses:  1. Left knee pain, unspecified chronicity     Plan: She is shown quad strengthening exercises for her to perform on her own.  We will see her back in 2 weeks see what type of response she had to the aspiration injection in the knee.  She may benefit from MRI of the knee if she continues to have mechanical symptoms.  Explained to her through the interpreter that she should be mindful of these mechanical symptoms.  Questions were encouraged and answered.  Interpreter was used throughout the exam to communicate with patient.  Follow-Up Instructions: Return in about 2 weeks (around 10/04/2019).   Orders:  Orders Placed This Encounter  Procedures  . Large Joint Inj  . XR Knee 1-2 Views Left   No orders of the defined types were placed in this encounter.     Procedures: Large Joint Inj: L knee on 09/20/2019 4:36 PM Indications: pain Details: 22 G 1.5 in needle, superolateral approach  Arthrogram: No  Medications: 3 mL lidocaine 1 %; 40 mg methylPREDNISolone acetate 40 MG/ML Aspirate: 5 mL yellow Outcome: tolerated well, no immediate complications Procedure, treatment alternatives, risks and benefits explained, specific risks discussed. Consent was given by the patient. Immediately prior to procedure a time out was called to verify the correct patient, procedure, equipment, support staff and site/side marked as required. Patient was prepped and draped in the usual sterile fashion.       Clinical Data: No additional findings.   Subjective: Chief Complaint  Patient presents with  . Left Knee - Pain    HPI Patient is pleasant 74 year old Spanish-speaking female comes in today with  left knee pain.  She is accompanied by an interpreter.  States she has had pain since December 2020.  She was washing dishes twisted turned had pain in the knee.  Since then she has had continued pain in the knee.  She did undergo a steroid injection in her knee in December and this helped.  She states she is having no pain.  However continues to have giving way catching painful popping in the knee.  Rates her pain to be 1-2 out of 10 pain at worst.  She tried naproxen knee brace.  Said no prior surgeries knee.  Had occasional pain in the knee prior to this.  She is diabetic and when asked about this she states she is not.  Glucose levels on epic show her glucose to be under good control.  She states the steroid injection in her knee causing a rise in her glucose levels. Review of Systems  Constitutional: Negative for chills and fever.     Objective: Vital Signs: There were no vitals taken for this visit.  Physical Exam Constitutional:      Appearance: She is not ill-appearing or diaphoretic.  Pulmonary:     Effort: Pulmonary effort is normal.  Neurological:     Mental Status: She is alert and oriented to person, place, and time.  Psychiatric:        Mood and Affect: Mood normal.  Ortho Exam Left knee tenderness along medial joint line.  Patellofemoral crepitus with passive range of motion.  No instability valgus varus stressing.  Slight effusion no abnormal warmth erythema.  McMurray's is negative. Specialty Comments:  No specialty comments available.  Imaging: XR Knee 1-2 Views Left  Result Date: 09/20/2019 Left knee 2 views: No subluxation dislocation.  No acute fractures.  Moderate patellofemoral changes.  Mild medial compartmental narrowing.    PMFS History: Patient Active Problem List   Diagnosis Date Noted  . Hyperlipidemia 05/13/2019  . Age-related osteoporosis without current pathological fracture 03/08/2019  . Chronic pain 01/01/2019  . Medication refill 01/01/2019    . Symptomatic anemia   . Iron deficiency anemia due to chronic blood loss 10/14/2017  . HTN (hypertension), benign 03/16/2017  . Primary insomnia 03/16/2017  . Osteoporosis 01/08/2017  . GERD (gastroesophageal reflux disease) 01/08/2017  . Vitamin D deficiency 01/08/2017  . Edema 03/13/2014   Past Medical History:  Diagnosis Date  . Anemia   . Diabetes mellitus without complication ([place])   . Hypertension   . Insomnia   . Polio    in childhood    History reviewed. No pertinent family history.  Past Surgical History:  Procedure Laterality Date  . CESAREAN SECTION    . SHOULDER SURGERY Left 2010   Social History   Occupational History  . Not on file  Tobacco Use  . Smoking status: Never Smoker  . Smokeless tobacco: Never Used  Substance and Sexual Activity  . Alcohol use: No  . Drug use: No  . Sexual activity: Not Currently

## 2019-09-23 ENCOUNTER — Other Ambulatory Visit: Payer: Self-pay | Admitting: Nurse Practitioner

## 2019-09-26 ENCOUNTER — Other Ambulatory Visit: Payer: Self-pay | Admitting: Internal Medicine

## 2019-09-26 DIAGNOSIS — D500 Iron deficiency anemia secondary to blood loss (chronic): Secondary | ICD-10-CM

## 2019-09-28 ENCOUNTER — Other Ambulatory Visit: Payer: Self-pay | Admitting: Internal Medicine

## 2019-09-28 DIAGNOSIS — D500 Iron deficiency anemia secondary to blood loss (chronic): Secondary | ICD-10-CM

## 2019-10-02 ENCOUNTER — Ambulatory Visit: Payer: Self-pay | Attending: Internal Medicine

## 2019-10-02 DIAGNOSIS — Z23 Encounter for immunization: Secondary | ICD-10-CM

## 2019-10-02 NOTE — Progress Notes (Signed)
   Covid-19 Vaccination Clinic  Name:  Jodi [name]    MRN: [id number] DOB: [birth year]  10/02/2019  [name] was observed post Covid-19 immunization for 15 minutes without incident. She was provided with Vaccine Information Sheet and instruction to access the V-Safe system.   [name] was instructed to call 911 with any severe reactions post vaccine: Marland Kitchen Difficulty breathing  . Swelling of face and throat  . A fast heartbeat  . A bad rash all over body  . Dizziness and weakness   Immunizations Administered    Name Date Dose VIS Date Route   Pfizer COVID-19 Vaccine 10/02/2019  8:54 AM 0.3 mL 06/30/2019 Intramuscular   Manufacturer: [place]   Lot: [id number]   [place]: [id number]

## 2019-10-05 ENCOUNTER — Ambulatory Visit: Payer: Medicare HMO | Admitting: Physician Assistant

## 2019-10-07 ENCOUNTER — Other Ambulatory Visit: Payer: Self-pay | Admitting: Internal Medicine

## 2019-10-10 ENCOUNTER — Other Ambulatory Visit: Payer: Self-pay | Admitting: Internal Medicine

## 2019-10-10 DIAGNOSIS — D500 Iron deficiency anemia secondary to blood loss (chronic): Secondary | ICD-10-CM

## 2019-10-12 ENCOUNTER — Encounter: Payer: Self-pay | Admitting: Physician Assistant

## 2019-10-12 ENCOUNTER — Ambulatory Visit (INDEPENDENT_AMBULATORY_CARE_PROVIDER_SITE_OTHER): Payer: Medicare HMO | Admitting: Physician Assistant

## 2019-10-12 ENCOUNTER — Other Ambulatory Visit: Payer: Self-pay

## 2019-10-12 DIAGNOSIS — M25562 Pain in left knee: Secondary | ICD-10-CM | POA: Diagnosis not present

## 2019-10-12 MED ORDER — DICLOFENAC SODIUM 1 % EX GEL: 4.0000 g | Freq: Four times a day (QID) | CUTANEOUS | 1 refills | Status: DC

## 2019-10-12 NOTE — Progress Notes (Signed)
Office Visit Note   Patient: Jodi [name]           Date of Birth: [birth year]           MRN: [id number] Visit Date: 10/12/2019              Requested by: Ladell Pier, MD [address] [place],  [place] [address] PCP: Ladell Pier, MD   Assessment & Plan: Visit Diagnoses:  1. Left knee pain, unspecified chronicity     Plan: I explained to her through the interpreter is present throughout examination today recommend physical therapy to work on range of motion strengthening particularly quad strengthening.  She did well also use Voltaren gel on her knee 4 g 4 times daily.  We will see her back in 6 weeks to see how she is doing.  Questions were encouraged and answered.  Follow-Up Instructions: Return in about 6 weeks (around 11/23/2019).   Orders:  No orders of the defined types were placed in this encounter.  Meds ordered this encounter  Medications  . diclofenac Sodium (VOLTAREN) 1 % GEL    Sig: Apply 4 g topically 4 (four) times daily. Left knee.    Dispense:  350 g    Refill:  1      Procedures: No procedures performed   Clinical Data: No additional findings.   Subjective: Chief Complaint  Patient presents with  . Left Knee - Follow-up    HPI  Jodi [name] returns today to follow-up after injection over the left knee.  She states the injection was very helpful.  She denies having any pain.  Still having some popping in the knee and giving way occasionally but not as often as it was.  She also notes some puffiness about the knee.  Review of Systems See HPI  Objective: Vital Signs: There were no vitals taken for this visit.  Physical Exam Neurological:     Mental Status: She is alert and oriented to person, place, and time.  Psychiatric:        Mood and Affect: Mood normal.     Ortho Exam Left knee no abnormal warmth erythema or effusion.  Positive patellofemoral crepitus passive range of motion.  No tenderness along medial  lateral joint line.  No instability valgus varus stressing.  Does have some tenderness just lateral to the patella.  Specialty Comments:  No specialty comments available.  Imaging: No results found.   PMFS History: Patient Active Problem List   Diagnosis Date Noted  . Hyperlipidemia 05/13/2019  . Age-related osteoporosis without current pathological fracture 03/08/2019  . Chronic pain 01/01/2019  . Medication refill 01/01/2019  . Symptomatic anemia   . Iron deficiency anemia due to chronic blood loss 10/14/2017  . HTN (hypertension), benign 03/16/2017  . Primary insomnia 03/16/2017  . Osteoporosis 01/08/2017  . GERD (gastroesophageal reflux disease) 01/08/2017  . Vitamin D deficiency 01/08/2017  . Edema 03/13/2014   Past Medical History:  Diagnosis Date  . Anemia   . Diabetes mellitus without complication ([place])   . Hypertension   . Insomnia   . Polio    in childhood    History reviewed. No pertinent family history.  Past Surgical History:  Procedure Laterality Date  . CESAREAN SECTION    . SHOULDER SURGERY Left 2010   Social History   Occupational History  . Not on file  Tobacco Use  . Smoking status: Never Smoker  . Smokeless tobacco: Never Used  Substance  and Sexual Activity  . Alcohol use: No  . Drug use: No  . Sexual activity: Not Currently

## 2019-10-17 ENCOUNTER — Other Ambulatory Visit: Payer: Self-pay | Admitting: Radiology

## 2019-10-17 DIAGNOSIS — M25562 Pain in left knee: Secondary | ICD-10-CM

## 2019-11-09 ENCOUNTER — Ambulatory Visit: Payer: Medicare HMO | Attending: Physician Assistant | Admitting: Physical Therapy

## 2019-11-09 DIAGNOSIS — R262 Difficulty in walking, not elsewhere classified: Secondary | ICD-10-CM | POA: Insufficient documentation

## 2019-11-09 DIAGNOSIS — M6281 Muscle weakness (generalized): Secondary | ICD-10-CM | POA: Insufficient documentation

## 2019-11-09 DIAGNOSIS — M25562 Pain in left knee: Secondary | ICD-10-CM | POA: Insufficient documentation

## 2019-11-17 ENCOUNTER — Ambulatory Visit: Payer: Medicare HMO | Admitting: Physical Therapy

## 2019-11-17 ENCOUNTER — Other Ambulatory Visit: Payer: Self-pay

## 2019-11-17 DIAGNOSIS — M25562 Pain in left knee: Secondary | ICD-10-CM | POA: Diagnosis not present

## 2019-11-17 DIAGNOSIS — R262 Difficulty in walking, not elsewhere classified: Secondary | ICD-10-CM

## 2019-11-17 DIAGNOSIS — M6281 Muscle weakness (generalized): Secondary | ICD-10-CM | POA: Diagnosis not present

## 2019-11-17 NOTE — Patient Instructions (Signed)
Access Code: [email or web address] URL: [email or web address] Date: 11/17/2019 Prepared by: Jari Favre  Exercises Seated Hip Abduction with Resistance - 1 x daily - 7 x weekly - 10 reps - 3 sets Seated March - 1 x daily - 7 x weekly - 10 reps - 3 sets Seated Hip Adduction Isometrics with Ball - 1 x daily - 7 x weekly - 10 reps - 3 sets Seated Long Arc Quad - 1 x daily - 7 x weekly - 10 reps - 3 sets Sit to Stand - 1 x daily - 7 x weekly - 3 sets - 5 reps

## 2019-11-17 NOTE — Therapy (Addendum)
[facility] [address], [place], [place], [address] Phone: [phone]   Fax:  [phone]  Physical Therapy Evaluation  Patient Details  Name: Jodi [name] MRN: [id number] Date of Birth: Nov 27, 1945 Referring Provider (PT): Pete Pelt, [place]   Encounter Date: 11/17/2019  PT End of Session - 11/17/19 1117    Visit Number  1    Date for PT Re-Evaluation  02/09/20    PT Start Time  1102    PT Stop Time  1144    PT Time Calculation (min)  42 min       Past Medical History:  Diagnosis Date  . Anemia   . Diabetes mellitus without complication ([place])   . Hypertension   . Insomnia   . Polio    in childhood    Past Surgical History:  Procedure Laterality Date  . CESAREAN SECTION    . SHOULDER SURGERY Left 2010    There were no vitals filed for this visit.   Subjective Assessment - 11/17/19 1105    Subjective  Pt states she made a strange turn and felt something pop in the knee (this happened in December) and she has had pain since that time.    Patient is accompained by:  Interpreter    Diagnostic tests  Xray    Patient Stated Goals  reduce pain with walking    Currently in Pain?  Yes    Pain Score  3     Pain Location  Knee    Pain Orientation  Left    Pain Descriptors / Indicators  Aching    Pain Type  Acute pain;Chronic pain    Pain Onset  More than a month ago    Pain Frequency  Intermittent    Aggravating Factors   walking and standing    Pain Relieving Factors  resting    Effect of Pain on Daily Activities  walking in the store hurts         [facility] PT Assessment - 11/19/19 0001      Assessment   Medical Diagnosis  M25.562 (ICD-10-CM) - Left knee pain, unspecified chronicity    Referring Provider (PT)  Pete Pelt, PA-C      Precautions   Precautions  None      Restrictions   Weight Bearing Restrictions  No      Balance Screen   Has the patient fallen in the past 6  months  Yes    How many times?  1    Has the patient had a decrease in activity level because of a fear of falling?   No    Is the patient reluctant to leave their home because of a fear of falling?   No      Home Film/video editor residence    Living Arrangements  Alone      Prior Function   Level of [place]  cares for her appartment      Cognition   Overall Cognitive Status  Within Functional Limits for tasks assessed      Observation/Other Assessments   Focus on Therapeutic Outcomes (FOTO)   57% limited      Observation/Other Assessments-Edema    Edema  Circumferential      Circumferential Edema   Circumferential - Right  33.5    Circumferential - Left   36.5      Posture/Postural Control  Posture/Postural Control  Postural limitations    Postural Limitations  Increased thoracic kyphosis;Weight shift right      ROM / Strength   AROM / PROM / Strength  AROM;Strength      AROM   Overall AROM Comments  Lt knee 0-130; Rt knee 0-140      Strength   Overall Strength Comments  Rt LE grossly 4+/5; Lt LE 5/5 with exceptions below    Strength Assessment Site  Hip;Knee    Right/Left Hip  Left    Left Hip Flexion  4+/5    Left Hip Internal Rotation  4/5    Left Hip ABduction  4/5    Right/Left Knee  Left    Left Knee Flexion  4/5    Left Knee Extension  4/5      Flexibility   Soft Tissue Assessment /Muscle Length  yes    Hamstrings  Lt tight      Palpation   Patella mobility  medial glide +pain Lt knee    Palpation comment  Lt quad and patellar tendon TTP      Special Tests   Other special tests  SLS <2 sec bilat; unable to stand in tandem stance      Transfers   Five time sit to stand comments   19 sec +UE support      Ambulation/Gait   Gait Pattern  Lateral trunk lean to right;Trendelenburg                Objective measurements completed on examination: See above findings.      [facility] Adult PT  Treatment/Exercise - 11/19/19 0001      Self-Care   Self-Care  Other Self-Care Comments    Other Self-Care Comments   intitial HEP             PT Education - 11/17/19 1148    Education Details  Access Code: [email or web address]    Person(s) Educated  Patient    Methods  Explanation;Demonstration;Handout;Verbal cues    Comprehension  Verbalized understanding;Returned demonstration       PT Short Term Goals - 11/19/19 1848      PT SHORT TERM GOAL #1   Title  ind with HEP    Time  6    Period  Weeks    Status  New    Target Date  12/28/19      PT SHORT TERM GOAL #2   Title  Pt will have 5/5 knee flex and ext without pain    Time  6    Period  Weeks    Status  New    Target Date  12/28/19        PT Long Term Goals - 11/19/19 1835      PT LONG TERM GOAL #1   Title  Pt will report at least 75% less pain    Time  8    Period  Weeks    Status  New    Target Date  02/09/20      PT LONG TERM GOAL #2   Title  Pt will be < or = to 50% limited on FOTO    Time  12    Period  Weeks    Status  New    Target Date  02/09/20      PT LONG TERM GOAL #3   Title  Pt will be able to walk around the grocery store without increased pain for return to community activities  Time  12    Period  Weeks    Status  New    Target Date  02/09/20      PT LONG TERM GOAL #4   Title  Pt will demonstrate Lt hip and knee strength of 5/5 MMT for improved gait and return to maximum functional activities.    Time  12    Period  Weeks    Status  New    Target Date  02/09/20      PT LONG TERM GOAL #5   Title  5 x sit to stand < or equal to 12 seconds for reduced risk of falls    Time  12    Period  Weeks    Status  New    Target Date  02/09/20             Plan - 11/19/19 1929    Clinical Impression Statement  Pt presents to clinic after acute strain of Lt knee.  She has swelling.  Pain is limiting her normal functional activities such as taking care of her home and community activities  are challenging due to increased pain when walking.  Pt has knee and LE weakness Lt side.  She has decreased knee ROM.  There is tenderness palpated Lt lateral quad and patellar tendon.  she also demonstrates increased risk of falls based on sit to stand test and decreased stance time in single leg and tandem stance.  Pt will benefit from skilled PTto address impairments so she can return to full functional activities.    Personal Factors and Comorbidities  Comorbidity 1    Comorbidities  polio history effecting Rt LE; Rt LE noticeably less muscle mass    Stability/Clinical Decision Making  Stable/Uncomplicated    Clinical Decision Making  Low    Rehab Potential  Excellent    PT Frequency  2x / week    PT Duration  12 weeks    PT Treatment/Interventions  ADLs/Self Care Home Management;Biofeedback;Cryotherapy;Electrical Stimulation;Iontophoresis 4mg /ml Dexamethasone;Moist Heat;Ultrasound;Gait training;Therapeutic activities;Neuromuscular re-education;Therapeutic exercise;Patient/family education;Manual techniques;Passive range of motion;Dry needling;Taping    PT Next Visit Plan  vaso at the end?; taping?; ionto? f/u on initial HEP and progress strength as able; nustep to warm up    PT Home Exercise Plan  Access Code: [email or web address]    Consulted and Agree with Plan of Care  Patient       Patient will benefit from skilled therapeutic intervention in order to improve the following deficits and impairments:  Abnormal gait, Decreased strength, Pain, Increased edema, Decreased range of motion  Visit Diagnosis: Acute pain of left knee  Muscle weakness (generalized)  Difficulty in walking, not elsewhere classified     Problem List Patient Active Problem List   Diagnosis Date Noted  . Hyperlipidemia 05/13/2019  . Age-related osteoporosis without current pathological fracture 03/08/2019  . Chronic pain 01/01/2019  . Medication refill 01/01/2019  . Symptomatic anemia   . Iron deficiency anemia due  to chronic blood loss 10/14/2017  . HTN (hypertension), benign 03/16/2017  . Primary insomnia 03/16/2017  . Osteoporosis 01/08/2017  . GERD (gastroesophageal reflux disease) 01/08/2017  . Vitamin D deficiency 01/08/2017  . Edema 03/13/2014    Jule Ser, PT 11/19/2019, 7:45 PM  [facility] [address] [place], [place], [address] Phone: [phone]   Fax:  [phone]  Name: Jodi [name] MRN: [id number] Date of Birth: [birth year]

## 2019-11-19 ENCOUNTER — Encounter: Payer: Self-pay | Admitting: Physical Therapy

## 2019-11-19 NOTE — Addendum Note (Signed)
Addended by: Su Hoff on: 11/19/2019 07:47 PM   Modules accepted: Orders

## 2019-11-23 ENCOUNTER — Other Ambulatory Visit: Payer: Self-pay

## 2019-11-23 ENCOUNTER — Ambulatory Visit: Payer: Medicare HMO | Admitting: Physician Assistant

## 2019-11-23 ENCOUNTER — Ambulatory Visit: Payer: Medicare HMO | Attending: Physician Assistant

## 2019-11-23 DIAGNOSIS — R262 Difficulty in walking, not elsewhere classified: Secondary | ICD-10-CM

## 2019-11-23 DIAGNOSIS — M6281 Muscle weakness (generalized): Secondary | ICD-10-CM | POA: Diagnosis not present

## 2019-11-23 DIAGNOSIS — M25562 Pain in left knee: Secondary | ICD-10-CM | POA: Diagnosis not present

## 2019-11-23 NOTE — Therapy (Signed)
[facility] [address], [place], [place], [address] Phone: [phone]   Fax:  [phone]  Physical Therapy Treatment  Patient Details  Name: Jodi [name] MRN: [id number] Date of Birth: 03-23-1946 Referring Provider (PT): Pete Pelt, [place]   Encounter Date: 11/23/2019  PT End of Session - 11/23/19 1222    Visit Number  2    Date for PT Re-Evaluation  02/09/20    Authorization Type  [employer]: 8 visits approved 4/30-7/23/21    Authorization - Visit Number  2    Authorization - Number of Visits  8    PT Start Time  [id number]    PT Stop Time  1231    PT Time Calculation (min)  44 min    Activity Tolerance  Patient tolerated treatment well    Behavior During Therapy  [facility] for tasks assessed/performed       Past Medical History:  Diagnosis Date  . Anemia   . Diabetes mellitus without complication ([place])   . Hypertension   . Insomnia   . Polio    in childhood    Past Surgical History:  Procedure Laterality Date  . CESAREAN SECTION    . SHOULDER SURGERY Left 2010    There were no vitals filed for this visit.  Subjective Assessment - 11/23/19 1148    Subjective  I have been hurting since I was here.  I haven't been able to do the exercises.    Currently in Pain?  Yes    Pain Score  7     Pain Location  Knee    Pain Orientation  Left    Pain Descriptors / Indicators  Aching    Pain Type  Acute pain    Pain Onset  More than a month ago    Pain Frequency  Constant    Aggravating Factors   it just hurts, walking    Pain Relieving Factors  ice, rest, elevation       Interpreter present for session.                [facility] Adult PT Treatment/Exercise - 11/23/19 0001      Exercises   Exercises  Knee/Hip;Lumbar      Lumbar Exercises: Stretches   Active Hamstring Stretch  Left;3 reps;20 seconds      Knee/Hip Exercises: Seated   Long Arc Quad  Strengthening;Both;2 sets;10 reps    [employer]  5" hold x 10    Marching  Both;2 sets;20 reps    Abd/Adduction Limitations  red band: 2x10      Knee/Hip Exercises: Supine   Quad Sets  Strengthening;Left;2 sets;10 reps    Heel Slides  Strengthening;Left;10 reps      Modalities   Modalities  Electrical Stimulation;Cryotherapy;Iontophoresis      Cryotherapy   Number Minutes Cryotherapy  15 Minutes    Cryotherapy Location  Knee    Type of Cryotherapy  Ice pack      Electrical Stimulation   Electrical Stimulation Location  Lt knee    Electrical Stimulation Action  IFC    Electrical Stimulation Parameters  15 minutes     Electrical Stimulation Goals  Pain      Iontophoresis   Type of Iontophoresis  Dexamethasone    Location  Lt lateral knee     Dose  1.0cc    Time  6 hour wear  PT Education - 11/23/19 1207    Education Details  Access Code: [email or web address], ionto information    Person(s) Educated  Patient    Methods  Explanation;Demonstration;Handout    Comprehension  Verbalized understanding;Returned demonstration       PT Short Term Goals - 11/19/19 1848      PT SHORT TERM GOAL #1   Title  ind with HEP    Time  6    Period  Weeks    Status  New    Target Date  12/28/19      PT SHORT TERM GOAL #2   Title  Pt will have 5/5 knee flex and ext without pain    Time  6    Period  Weeks    Status  New    Target Date  12/28/19        PT Long Term Goals - 11/19/19 1835      PT LONG TERM GOAL #1   Title  Pt will report at least 75% less pain    Time  8    Period  Weeks    Status  New    Target Date  02/09/20      PT LONG TERM GOAL #2   Title  Pt will be < or = to 50% limited on FOTO    Time  12    Period  Weeks    Status  New    Target Date  02/09/20      PT LONG TERM GOAL #3   Title  Pt will be able to walk around the grocery store without increased pain for return to community activities    Time  12    Period  Weeks    Status  New    Target Date  02/09/20      PT LONG TERM GOAL  #4   Title  Pt will demonstrate Lt hip and knee strength of 5/5 MMT for improved gait and return to maximum functional activities.    Time  12    Period  Weeks    Status  New    Target Date  02/09/20      PT LONG TERM GOAL #5   Title  5 x sit to stand < or equal to 12 seconds for reduced risk of falls    Time  12    Period  Weeks    Status  New    Target Date  02/09/20            Plan - 11/23/19 1221    Clinical Impression Statement  Pt with first time follow-up after evaluation.  Pt reports that her pain has been 7/10 constantly over the lateral aspect of her Lt knee.  Pt stopped doing exercises due to pain.  Pt spent session modifying HEP to address pain.  Pt with localized palpable tenderness over Lt lateral femoral condyle.  Electrical stimulation was used for pain.  Pt didn't have any increase in pain with exercises performed in the clinic today.  Pt will continue to benefit from skilled PT to address Lt knee pain.    PT Frequency  2x / week    PT Duration  12 weeks    PT Treatment/Interventions  ADLs/Self Care Home Management;Biofeedback;Cryotherapy;Electrical Stimulation;Iontophoresis 4mg /ml Dexamethasone;Moist Heat;Ultrasound;Gait training;Therapeutic activities;Neuromuscular re-education;Therapeutic exercise;Patient/family education;Manual techniques;Passive range of motion;Dry needling;Taping    PT Next Visit Plan  see how pt responded to updates to HEP, electrical stimulation and ionto patch.  Continue ionto  if tolerated.  Advance activity if able.    PT Home Exercise Plan  Access Code: [email or web address]    Recommended Other Services  initial cert is signed    Consulted and Agree with Plan of Care  Patient       Patient will benefit from skilled therapeutic intervention in order to improve the following deficits and impairments:  Abnormal gait, Decreased strength, Pain, Increased edema, Decreased range of motion  Visit Diagnosis: Acute pain of left knee  Muscle weakness  (generalized)  Difficulty in walking, not elsewhere classified     Problem List Patient Active Problem List   Diagnosis Date Noted  . Hyperlipidemia 05/13/2019  . Age-related osteoporosis without current pathological fracture 03/08/2019  . Chronic pain 01/01/2019  . Medication refill 01/01/2019  . Symptomatic anemia   . Iron deficiency anemia due to chronic blood loss 10/14/2017  . HTN (hypertension), benign 03/16/2017  . Primary insomnia 03/16/2017  . Osteoporosis 01/08/2017  . GERD (gastroesophageal reflux disease) 01/08/2017  . Vitamin D deficiency 01/08/2017  . Edema 03/13/2014    Sigurd Sos, PT 11/23/19 12:26 PM  [facility] [address] [place], [place], [address] Phone: [phone]   Fax:  [phone]  Name: Jodi [name] MRN: [id number] Date of Birth: 1945/08/10

## 2019-11-23 NOTE — Patient Instructions (Signed)
IONTOPHORESIS PATIENT PRECAUTIONS & CONTRAINDICATIONS:  . Redness under one or both electrodes can occur.  This characterized by a uniform redness that usually disappears within 12 hours of treatment. . Small pinhead size blisters may result in response to the drug.  Contact your physician if the problem persists more than 24 hours. . On rare occasions, iontophoresis therapy can result in temporary skin reactions such as rash, inflammation, irritation or burns.  The skin reactions may be the result of individual sensitivity to the ionic solution used, the condition of the skin at the start of treatment, reaction to the materials in the electrodes, allergies or sensitivity to dexamethasone, or a poor connection between the patch and your skin.  Discontinue using iontophoresis if you have any of these reactions and report to your therapist. . Remove the Patch or electrodes if you have any undue sensation of pain or burning during the treatment and report discomfort to your therapist. . Tell your Therapist if you have had known adverse reactions to the application of electrical current. . If using the Patch, the LED light will turn off when treatment is complete and the patch can be removed.  Approximate treatment time is 1-3 hours.  Remove the patch when light goes off or after 6 hours. . The Patch can be worn during normal activity, however excessive motion where the electrodes have been placed can cause poor contact between the skin and the electrode or uneven electrical current resulting in greater risk of skin irritation. Marland Kitchen Keep out of the reach of children.   . DO NOT use if you have a cardiac pacemaker or any other electrically sensitive implanted device. . DO NOT use if you have a known sensitivity to dexamethasone. . DO NOT use during Magnetic Resonance Imaging (MRI). . DO NOT use over broken or compromised skin (e.g. sunburn, cuts, or acne) due to the increased risk of skin reaction. . DO  NOT SHAVE over the area to be treated:  To establish good contact between the Patch and the skin, excessive hair may be clipped. . DO NOT place the Patch or electrodes on or over your eyes, directly over your heart, or brain. . DO NOT reuse the Patch or electrodes as this may cause burns to occur. Access Code: [email or web address] URL: [email or web address] Date: 11/23/2019 Prepared by: Claiborne Billings  Exercises Seated Hip Abduction with Resistance - 1 x daily - 7 x weekly - 10 reps - 3 sets Seated March - 1 x daily - 7 x weekly - 10 reps - 3 sets Seated Hip Adduction Isometrics with Ball - 1 x daily - 7 x weekly - 10 reps - 3 sets Seated Hamstring Stretch - 1 x daily - 7 x weekly - 10 reps - 3 sets Supine Heel Slide - 2 x daily - 7 x weekly - 2 sets - 10 reps Supine Quad Set - 2 x daily - 7 x weekly - 2 sets - 10 reps - 5 hold  [facility] [address] [place], [place] [address] Phone # [phone] Fax [phone]

## 2019-11-25 ENCOUNTER — Other Ambulatory Visit: Payer: Self-pay | Admitting: Physician Assistant

## 2019-11-27 ENCOUNTER — Ambulatory Visit: Payer: Medicare HMO | Admitting: Physical Therapy

## 2019-11-27 ENCOUNTER — Other Ambulatory Visit: Payer: Self-pay

## 2019-11-27 ENCOUNTER — Encounter: Payer: Self-pay | Admitting: Physical Therapy

## 2019-11-27 DIAGNOSIS — M6281 Muscle weakness (generalized): Secondary | ICD-10-CM

## 2019-11-27 DIAGNOSIS — R262 Difficulty in walking, not elsewhere classified: Secondary | ICD-10-CM

## 2019-11-27 DIAGNOSIS — M25562 Pain in left knee: Secondary | ICD-10-CM

## 2019-11-27 NOTE — Therapy (Signed)
[facility] [address] [place], [place], [address] Phone: [phone]   Fax:  [phone]  Physical Therapy Treatment  Patient Details  Name: Jodi [name] MRN: [id number] Date of Birth: 1946/02/14 Referring Provider (PT): Pete Pelt, [place]   Encounter Date: 11/27/2019  PT End of Session - 11/27/19 1521    Visit Number  3    Date for PT Re-Evaluation  02/09/20    Authorization Type  [employer]: 8 visits approved 4/30-7/23/21    Authorization - Visit Number  3    Authorization - Number of Visits  8    PT Start Time  1522    PT Stop Time  1610    PT Time Calculation (min)  48 min    Activity Tolerance  Patient tolerated treatment well    Behavior During Therapy  [facility] for tasks assessed/performed       Past Medical History:  Diagnosis Date  . Anemia   . Diabetes mellitus without complication ([place])   . Hypertension   . Insomnia   . Polio    in childhood    Past Surgical History:  Procedure Laterality Date  . CESAREAN SECTION    . SHOULDER SURGERY Left 2010    There were no vitals filed for this visit.  Subjective Assessment - 11/27/19 1525    Subjective  I am much better.  I feel the knee is heavy but no pain today.    Patient is accompained by:  Interpreter    Patient Stated Goals  reduce pain with walking    Currently in Pain?  No/denies                       [facility] Adult PT Treatment/Exercise - 11/27/19 0001      Lumbar Exercises: Sidelying   Hip Abduction  Left;10 reps      Knee/Hip Exercises: Stretches   Active Hamstring Stretch  Left;2 reps;20 seconds      Knee/Hip Exercises: Seated   Long Arc Quad  Strengthening;Both;2 sets;10 reps    Long Arc Quad Weight  2 lbs.    Sit to [employer]  10 reps      Knee/Hip Exercises: Supine   Short Arc Quad Sets  Strengthening;Left;20 reps    Short Arc Quad Sets Limitations  2lb    Heel Slides  Strengthening;Left;10 reps    Straight  Leg Raises  Strengthening;Left;10 reps    Straight Leg Raises Limitations  cue to do quad set    Other Supine Knee/Hip Exercises  hip abduction green band 10x      Cryotherapy   Number Minutes Cryotherapy  15 Minutes    Cryotherapy Location  Knee    Type of Cryotherapy  Ice pack      Electrical Stimulation   Electrical Stimulation Location  Lt knee    Electrical Stimulation Action  IFC    Electrical Stimulation Parameters  15 min    Electrical Stimulation Goals  Pain      Iontophoresis   Type of Iontophoresis  Dexamethasone    Location  Lt lateral knee     Dose  1.0cc dose #2    Time  6 hour wear               PT Short Term Goals - 11/27/19 1600      PT SHORT TERM GOAL #1   Title  ind with HEP    Status  Achieved  PT Long Term Goals - 11/19/19 1835      PT LONG TERM GOAL #1   Title  Pt will report at least 75% less pain    Time  8    Period  Weeks    Status  New    Target Date  02/09/20      PT LONG TERM GOAL #2   Title  Pt will be < or = to 50% limited on FOTO    Time  12    Period  Weeks    Status  New    Target Date  02/09/20      PT LONG TERM GOAL #3   Title  Pt will be able to walk around the grocery store without increased pain for return to community activities    Time  12    Period  Weeks    Status  New    Target Date  02/09/20      PT LONG TERM GOAL #4   Title  Pt will demonstrate Lt hip and knee strength of 5/5 MMT for improved gait and return to maximum functional activities.    Time  12    Period  Weeks    Status  New    Target Date  02/09/20      PT LONG TERM GOAL #5   Title  5 x sit to stand < or equal to 12 seconds for reduced risk of falls    Time  12    Period  Weeks    Status  New    Target Date  02/09/20            Plan - 11/27/19 1557    Clinical Impression Statement  Pt is responding well to pain management techniques used such as stim and ionto patch.  She was able to progress strengthening with added  resistance today.  She was able to perform her HEP this week without pain.  Pt will continue to benefit from skilled PT to progress LE strength and manage pain.    PT Treatment/Interventions  ADLs/Self Care Home Management;Biofeedback;Cryotherapy;Electrical Stimulation;Iontophoresis 4mg /ml Dexamethasone;Moist Heat;Ultrasound;Gait training;Therapeutic activities;Neuromuscular re-education;Therapeutic exercise;Patient/family education;Manual techniques;Passive range of motion;Dry needling;Taping    PT Next Visit Plan  ionto 3; porgress LE strength as able; try nustep if pain is under control    PT Home Exercise Plan  Access Code: [email or web address]    Consulted and Agree with Plan of Care  Patient       Patient will benefit from skilled therapeutic intervention in order to improve the following deficits and impairments:  Abnormal gait, Decreased strength, Pain, Increased edema, Decreased range of motion  Visit Diagnosis: Acute pain of left knee  Muscle weakness (generalized)  Difficulty in walking, not elsewhere classified     Problem List Patient Active Problem List   Diagnosis Date Noted  . Hyperlipidemia 05/13/2019  . Age-related osteoporosis without current pathological fracture 03/08/2019  . Chronic pain 01/01/2019  . Medication refill 01/01/2019  . Symptomatic anemia   . Iron deficiency anemia due to chronic blood loss 10/14/2017  . HTN (hypertension), benign 03/16/2017  . Primary insomnia 03/16/2017  . Osteoporosis 01/08/2017  . GERD (gastroesophageal reflux disease) 01/08/2017  . Vitamin D deficiency 01/08/2017  . Edema 03/13/2014    Jule Ser, PT 11/27/2019, 4:03 PM  [facility] [address], [place], [place], [address] Phone: [phone]   Fax:  [phone]  Name: Jodi [name] MRN: [id number]  Date of Birth: 1946-07-15

## 2019-12-05 ENCOUNTER — Other Ambulatory Visit: Payer: Self-pay | Admitting: Internal Medicine

## 2019-12-05 DIAGNOSIS — M810 Age-related osteoporosis without current pathological fracture: Secondary | ICD-10-CM

## 2019-12-20 ENCOUNTER — Other Ambulatory Visit: Payer: Self-pay

## 2019-12-20 ENCOUNTER — Ambulatory Visit: Payer: Medicare HMO | Attending: Physician Assistant

## 2019-12-20 DIAGNOSIS — M25562 Pain in left knee: Secondary | ICD-10-CM | POA: Diagnosis not present

## 2019-12-20 DIAGNOSIS — R262 Difficulty in walking, not elsewhere classified: Secondary | ICD-10-CM | POA: Diagnosis not present

## 2019-12-20 DIAGNOSIS — M6281 Muscle weakness (generalized): Secondary | ICD-10-CM

## 2019-12-20 NOTE — Therapy (Signed)
[facility] [address], [place], [place], [address] Phone: [phone]   Fax:  [phone]  Physical Therapy Treatment  Patient Details  Name: Jodi [name] MRN: [id number] Date of Birth: [birth year] Referring Provider (PT): Pete Pelt, [place]   Encounter Date: 12/20/2019  PT End of Session - 12/20/19 1427    Visit Number  4    Date for PT Re-Evaluation  02/09/20    Authorization Type  [employer]: 8 visits approved 4/30-7/23/21    Authorization - Visit Number  4    Authorization - Number of Visits  8    PT Start Time  9735    PT Stop Time  1426    PT Time Calculation (min)  23 min    Activity Tolerance  Patient tolerated treatment well    Behavior During Therapy  [facility] for tasks assessed/performed       Past Medical History:  Diagnosis Date  . Anemia   . Diabetes mellitus without complication ([place])   . Hypertension   . Insomnia   . Polio    in childhood    Past Surgical History:  Procedure Laterality Date  . CESAREAN SECTION    . SHOULDER SURGERY Left 2010    There were no vitals filed for this visit.  Subjective Assessment - 12/20/19 1434    Subjective  I havent been having any pain in my knee.  I'm doing my exercises.    Patient is accompained by:  Interpreter    Currently in Pain?  No/denies         [facility] PT Assessment - 12/20/19 0001      Assessment   Medical Diagnosis  M25.562 (ICD-10-CM) - Left knee pain, unspecified chronicity    Referring Provider (PT)  Pete Pelt, PA-C      Precautions   Precautions  None      Cognition   Overall Cognitive Status  Within Functional Limits for tasks assessed      Circumferential Edema   Circumferential - Right  33.5   history of polio with Rt LE atrophy   Circumferential - Left   34.5      AROM   Overall AROM Comments  0-135      Strength   Left Hip Flexion  4+/5    Left Knee Flexion  5/5    Left Knee Extension  5/5       Special Tests   Other special tests  SLS on Lt x 8 seconds      Transfers   Five time sit to stand comments   13.6 seconds without hands.      Ambulation/Gait   Gait Pattern  Within Functional Limits                    [facility] Adult PT Treatment/Exercise - 12/20/19 0001      Iontophoresis   Type of Iontophoresis  Dexamethasone    Location  Lt lateral knee     Dose  1.0cc dose #3    Time  6 hour wear             PT Education - 12/20/19 1424    Education Details  review of all HEP with pt using [employer] website    Person(s) Educated  Patient    Methods  Explanation    Comprehension  Verbalized understanding       PT Short Term Goals - 11/27/19 1600  PT SHORT TERM GOAL #1   Title  ind with HEP    Status  Achieved        PT Long Term Goals - 12/20/19 1417      PT LONG TERM GOAL #1   Title  Pt will report at least 75% less pain    Status  Achieved      PT LONG TERM GOAL #2   Title  Pt will be < or = to 50% limited on FOTO    Baseline  33% limitation    Status  Achieved      PT LONG TERM GOAL #3   Title  Pt will be able to walk around the grocery store without increased pain for return to community activities    Status  Achieved      PT LONG TERM GOAL #4   Title  Pt will demonstrate Lt hip and knee strength of 5/5 MMT for improved gait and return to maximum functional activities.    Baseline  5/5 knee, 4+/5 hip    Status  Achieved      PT LONG TERM GOAL #5   Title  5 x sit to stand < or equal to 12 seconds for reduced risk of falls    Baseline  13.6 seconds    Status  Partially Met            Plan - 12/20/19 1433    Clinical Impression Statement  Pt has not had Lt knee pain since visit 3 weeks ago.  Pt has been compliant in HEP for strength and flexibility.  FOTO is improved to 33% limitation.  Pt is no longer waking at night with knee pain and is able to perform community ambulation without pain.  Lt knee strength is 5/5 and pt  performed 5x sit to stand in 13.6 seconds without use of hands.  Pt will D/C to HEP and follow-up with MD as needed.    PT Next Visit Plan  D/C PT to HEP    PT Home Exercise Plan  Access Code: [email or web address]    Consulted and Agree with Plan of Care  Patient       Patient will benefit from skilled therapeutic intervention in order to improve the following deficits and impairments:     Visit Diagnosis: Acute pain of left knee  Muscle weakness (generalized)  Difficulty in walking, not elsewhere classified     Problem List Patient Active Problem List   Diagnosis Date Noted  . Hyperlipidemia 05/13/2019  . Age-related osteoporosis without current pathological fracture 03/08/2019  . Chronic pain 01/01/2019  . Medication refill 01/01/2019  . Symptomatic anemia   . Iron deficiency anemia due to chronic blood loss 10/14/2017  . HTN (hypertension), benign 03/16/2017  . Primary insomnia 03/16/2017  . Osteoporosis 01/08/2017  . GERD (gastroesophageal reflux disease) 01/08/2017  . Vitamin D deficiency 01/08/2017  . Edema 03/13/2014    PHYSICAL THERAPY DISCHARGE SUMMARY  Visits from Start of Care: 4  Current functional level related to goals / functional outcomes: See above for current status.  Pt has met all goals and will be discharged to HEP.     Remaining deficits: No functional deficits remain.  Pt experiences a clicking with open chain Lt knee flexion/extension over the lateral aspect.  This does not limit function.     Education / Equipment: HEP Plan: Patient agrees to discharge.  Patient goals were met. Patient is being discharged due to meeting the stated rehab goals.  ?????  Sigurd Sos, PT 12/20/19 2:35 PM  [facility] [address] [place], [place], [address] Phone: [phone]   Fax:  [phone]  Name: Jodi [name] MRN: [id number] Date of Birth: 01/04/1946

## 2019-12-25 ENCOUNTER — Encounter: Payer: Medicare HMO | Admitting: Physical Therapy

## 2019-12-26 DIAGNOSIS — Z1231 Encounter for screening mammogram for malignant neoplasm of breast: Secondary | ICD-10-CM | POA: Diagnosis not present

## 2020-01-01 ENCOUNTER — Encounter: Payer: Medicare HMO | Admitting: Physical Therapy

## 2020-01-03 ENCOUNTER — Encounter: Payer: Self-pay | Admitting: Internal Medicine

## 2020-01-03 NOTE — Progress Notes (Signed)
I received mammogram report from [facility] mammography.  Patient had mammogram 12/26/2019.  The mammogram was normal.

## 2020-01-04 ENCOUNTER — Other Ambulatory Visit: Payer: Self-pay | Admitting: Internal Medicine

## 2020-01-14 ENCOUNTER — Other Ambulatory Visit: Payer: Self-pay | Admitting: Orthopaedic Surgery

## 2020-01-15 ENCOUNTER — Encounter: Payer: Self-pay | Admitting: Internal Medicine

## 2020-01-15 ENCOUNTER — Other Ambulatory Visit: Payer: Self-pay

## 2020-01-15 ENCOUNTER — Ambulatory Visit: Payer: Medicare HMO | Attending: Internal Medicine | Admitting: Internal Medicine

## 2020-01-15 ENCOUNTER — Encounter: Payer: Medicare HMO | Admitting: Physical Therapy

## 2020-01-15 VITALS — BP 130/84 | HR 86 | Temp 98.1°F | Resp 16 | Ht 60.0 in | Wt 153.0 lb

## 2020-01-15 DIAGNOSIS — E663 Overweight: Secondary | ICD-10-CM | POA: Diagnosis not present

## 2020-01-15 DIAGNOSIS — F320 Major depressive disorder, single episode, mild: Secondary | ICD-10-CM | POA: Diagnosis not present

## 2020-01-15 DIAGNOSIS — R300 Dysuria: Secondary | ICD-10-CM | POA: Diagnosis not present

## 2020-01-15 DIAGNOSIS — E119 Type 2 diabetes mellitus without complications: Secondary | ICD-10-CM

## 2020-01-15 DIAGNOSIS — N3281 Overactive bladder: Secondary | ICD-10-CM

## 2020-01-15 DIAGNOSIS — I10 Essential (primary) hypertension: Secondary | ICD-10-CM | POA: Diagnosis not present

## 2020-01-15 DIAGNOSIS — F330 Major depressive disorder, recurrent, mild: Secondary | ICD-10-CM | POA: Insufficient documentation

## 2020-01-15 LAB — POCT GLYCOSYLATED HEMOGLOBIN (HGB A1C): Hemoglobin A1C: 6.5 % — AB (ref 4.0–5.6)

## 2020-01-15 LAB — GLUCOSE, POCT (MANUAL RESULT ENTRY): POC Glucose: 130 mg/dl — AB (ref 70–99)

## 2020-01-15 MED ORDER — ACCU-CHEK GUIDE VI STRP: ORAL_STRIP | 12 refills | Status: DC

## 2020-01-15 MED ORDER — MIRABEGRON ER 50 MG PO TB24: ORAL_TABLET | ORAL | 6 refills | Status: DC

## 2020-01-15 MED ORDER — ACCU-CHEK FASTCLIX LANCET KIT: PACK | 6 refills | Status: DC

## 2020-01-15 MED ORDER — ACCU-CHEK GUIDE ME W/DEVICE KIT: PACK | 0 refills | Status: DC

## 2020-01-15 MED ORDER — MIRABEGRON ER 50 MG PO TB24: 50.0000 mg | ORAL_TABLET | Freq: Every day | ORAL | 6 refills | Status: DC

## 2020-01-15 NOTE — Progress Notes (Signed)
Patient ID: Jodi [name], female    DOB: [birth year]  MRN: [id number]  CC: No chief complaint on file.   Subjective: Jodi [name] is a 74 y.o. female who presents for chronic ds management Her concerns today include:  Ptwith hx of GERD, osteoporosis, Vit D def, diverticulosis,iron def,HTN, HL and pre-DM.   still c/o has dysuria, bad urine odor and incontinence.  She has had urge incontinence for a while.  The dysuria and bad odor she reports having for the past 3 months.  Symptoms come and go.  On Myrbetriq 25 mg which helps for the first 2 hrs of the day when she takes it.  She is frustrated.  She wears panty shields/pads but just avoids going out too much because of fear of having an accident with the incontinence.  She screened positive for depression today.  She associates her depression with her ongoing urinary symptoms.  "I just wanted to get better."  She declines referral for counseling or taking medication for the depression.  Had shingles in 2019 around waist. Thinks she now has it again in the abdominal fold x 3 days.  The area is burning.    PreDM: compliant with Metformin Feels she can do better with her eating habits.  Eats a lot of bread, tortia and spaghetti  Walks daily for 30-40 mins  HYPERTENSION Currently taking: see medication list Med Adherence: _0  Yes  -norvasc  _1  No Medication side effects: _2  Yes    _3  No Adherence with salt restriction: _4  Yes    _5  No Home Monitoring?: _6  Yes    _7  No but has a device Monitoring Frequency: _8  Yes    _9  No Home BP results range: _10  Yes    _11  No SOB? _12  Yes    _13  No Chest Pain?: _14  Yes    _15  No Leg swelling?: _16  Yes    _17  No Headaches?: _18  Yes    _19  No Dizziness? _20  Yes    _21  No Comments:    Patient Active Problem List   Diagnosis Date Noted  . Hyperlipidemia 05/13/2019  . Age-related osteoporosis without current pathological fracture 03/08/2019  . Chronic pain 01/01/2019  . Medication  refill 01/01/2019  . Symptomatic anemia   . Iron deficiency anemia due to chronic blood loss 10/14/2017  . HTN (hypertension), benign 03/16/2017  . Primary insomnia 03/16/2017  . Osteoporosis 01/08/2017  . GERD (gastroesophageal reflux disease) 01/08/2017  . Vitamin D deficiency 01/08/2017  . Edema 03/13/2014     Current Outpatient Medications on File Prior to Visit  Medication Sig Dispense Refill  . metFORMIN (GLUCOPHAGE) 500 MG tablet TOME UNA TABLETA TODOS LOS DIAS 90 tablet 0  . ACCU-CHEK SOFTCLIX LANCETS lancets Use as instructed to test blood glucose twice daily. 100 each 12  . alendronate (FOSAMAX) 70 MG tablet TAKE 1 TABLET (70 MG TOTAL) BY MOUTH ONCE A WEEK. 12 tablet 1  . amLODipine (NORVASC) 5 MG tablet Take 0.5 tablets (2.5 mg total) by mouth daily. 45 tablet 3  . atorvastatin (LIPITOR) 10 MG tablet TOME UNA TABLETA TODOS LOS DIAS 90 tablet 1  . Blood Glucose Monitoring Suppl (ACCU-CHEK AVIVA PLUS) w/Device KIT To test blood glucose twice daily. 1 kit 0  . CALCIUM PO Take 1 tablet by mouth daily.    Marland Kitchen dexlansoprazole (DEXILANT) 60 MG capsule TOME UNA CAPSULA TODOS LOS DIAS. 30 capsule 5  . diclofenac Sodium (VOLTAREN) 1 % GEL APPLY 4 G TOPICALLY 4 (FOUR) TIMES  DAILY. LEFT KNEE. 400 g 1  . Elastic Bandages & Supports (LUMBAR BACK BRACE/SUPPORT PAD) MISC 1 application by Does not apply route daily. 1 each 0  . ferrous sulfate 325 (65 FE) MG tablet TOME UNA TABLETA TODOS LOS DIAS CON EL DESAYUNO 90 tablet 1  . hydrochlorothiazide (HYDRODIURIL) 12.5 MG tablet Take 1 tablet (12.5 mg total) by mouth daily. 90 tablet 3  . Iron-FA-B Cmp-C-Biot-Probiotic (FUSION PLUS) CAPS Take 1 tablet by mouth every other day. As tolerated 90 capsule 2  . Lancet Devices (ACCU-CHEK SOFTCLIX) lancets Use as instructed to test blood glucose twice daily. 1 each 0  . senna-docusate (SENOKOT-S) 8.6-50 MG tablet Take 1 tablet by mouth at bedtime as needed for mild constipation. 120 tablet 3  . traMADol  (ULTRAM) 50 MG tablet Take 1 tablet (50 mg total) by mouth every 12 (twelve) hours as needed. 40 tablet 0  . Vitamin D, Cholecalciferol, 10 MCG (400 UNIT) CHEW Chew 2 tablets (800 Units total) by mouth daily. 60 tablet 6   No current facility-administered medications on file prior to visit.    Allergies  Allergen Reactions  . Cortisone     Rash, swelling  . Gabapentin Nausea Only    With dizziness  . Pork-Derived Products     Rash, joint pain  . Shellfish Allergy Rash and Other (See Comments)    joint pain, GI pain    Social History   Socioeconomic History  . Marital status: Single    Spouse name: Not on file  . Number of children: Not on file  . Years of education: Not on file  . Highest education level: Not on file  Occupational History  . Not on file  Tobacco Use  . Smoking status: Never Smoker  . Smokeless tobacco: Never Used  Vaping Use  . Vaping Use: Never used  Substance and Sexual Activity  . Alcohol use: No  . Drug use: No  . Sexual activity: Not Currently  Other Topics Concern  . Not on file  Social History Narrative  . Not on file   Social Determinants of Health   Financial Resource Strain:   . Difficulty of Paying Living Expenses:   Food Insecurity:   . Worried About Charity fundraiser in the Last Year:   . Arboriculturist in the Last Year:   Transportation Needs:   . Film/video editor (Medical):   Marland Kitchen Lack of Transportation (Non-Medical):   Physical Activity:   . Days of Exercise per Week:   . Minutes of Exercise per Session:   Stress:   . Feeling of Stress :   Social Connections:   . Frequency of Communication with Friends and Family:   . Frequency of Social Gatherings with Friends and Family:   . Attends Religious Services:   . Active Member of Clubs or Organizations:   . Attends Archivist Meetings:   Marland Kitchen Marital Status:   Intimate Partner Violence:   . Fear of Current or Ex-Partner:   . Emotionally Abused:   Marland Kitchen Physically  Abused:   . Sexually Abused:     No family history on file.  Past Surgical History:  Procedure Laterality Date  . CESAREAN SECTION    . SHOULDER SURGERY Left 2010    ROS: Review of Systems Negative except as stated above  PHYSICAL EXAM: BP 130/84   Pulse 86   Temp 98.1 F (36.7 C)   Resp 16   Ht 5' (1.524  m)   Wt 153 lb (69.4 kg)   SpO2 96%   BMI 29.88 kg/m   Wt Readings from Last 3 Encounters:  01/15/20 153 lb (69.4 kg)  03/07/19 149 lb 12.8 oz (67.9 kg)  01/10/19 147 lb (66.7 kg)    Physical Exam  General appearance - alert, well appearing, and in no distress Mental status - normal mood, behavior, speech, dress, motor activity, and thought processes Neck - supple, no significant adenopathy Chest - clear to auscultation, no wheezes, rales or rhonchi, symmetric air entry Heart - normal rate, regular rhythm, normal S1, S2, no murmurs, rubs, clicks or gallops Extremities - peripheral pulses normal, no pedal edema, no clubbing or cyanosis Skin -no rash seen below the abdominal fold Depression screen [facility] 2/9 01/15/2020 01/10/2019 08/29/2018  Decreased Interest 2 0 2  Down, Depressed, Hopeless - 0 0  PHQ - 2 Score 2 0 2  Altered sleeping _0 Tired, decreased energy _1 Change in appetite _2 Feeling bad or failure about yourself  0 0 0  Trouble concentrating 2 0 3  Moving slowly or fidgety/restless 2 0 (No Data)  Suicidal thoughts 0 0 (No Data)  PHQ-9 Score _3 Some recent data might be hidden   Results for orders placed or performed in visit on 01/15/20  POCT glucose (manual entry)  Result Value Ref Range   POC Glucose 130 (A) 70 - 99 mg/dl  POCT glycosylated hemoglobin (Hb A1C)  Result Value Ref Range   Hemoglobin A1C 6.5 (A) 4.0 - 5.6 %   HbA1c POC (<> result, manual entry)     HbA1c, POC (prediabetic range)     HbA1c, POC (controlled diabetic range)       CMP Latest Ref Rng & Units 05/12/2019 03/25/2018 11/19/2017  Glucose 65 - 99 mg/dL  102(H) 108(H) 102  BUN 8 - 27 mg/dL _4 Creatinine 0.57 - 1.00 mg/dL 0.63 0.72 0.69  Sodium 134 - 144 mmol/L 139 142 140  Potassium 3.5 - 5.2 mmol/L 4.3 4.1 4.7  Chloride 96 - 106 mmol/L 104 107 109  CO2 20 - 29 mmol/L _5 Calcium 8.7 - 10.3 mg/dL 9.3 9.6 9.2  Total Protein 6.0 - 8.5 g/dL 7.0 7.5 7.6  Total Bilirubin 0.0 - 1.2 mg/dL 0.5 0.6 0.4  Alkaline Phos 39 - 117 IU/L 86 74 80  AST 0 - 40 IU/L _6 ALT 0 - 32 IU/L _7 Lipid Panel     Component Value Date/Time   CHOL 238 (H) 05/12/2019 0907   TRIG 152 (H) 05/12/2019 0907   HDL 63 05/12/2019 0907   CHOLHDL 3.8 05/12/2019 0907   CHOLHDL 2.8 04/02/2016 1042   VLDL 21 04/02/2016 1042   LDLCALC 148 (H) 05/12/2019 0907    CBC    Component Value Date/Time   WBC 5.6 05/12/2019 0907   WBC 4.4 03/25/2018 0912   WBC 5.1 10/15/2017 0820   RBC 3.82 05/12/2019 0907   RBC 4.16 03/25/2018 0912   RBC 4.15 03/25/2018 0912   HGB 11.3 05/12/2019 0907   HCT 34.5 05/12/2019 0907   PLT 419 05/12/2019 0907   MCV 90 05/12/2019 0907   MCH 29.6 05/12/2019 0907   MCH 30.6 03/25/2018 0912   MCHC 32.8 05/12/2019 0907   MCHC 32.6 03/25/2018 0912   RDW 13.9 05/12/2019 0907   LYMPHSABS 1.3 03/25/2018 0912  MONOABS 0.5 03/25/2018 0912   EOSABS 0.2 03/25/2018 0912   BASOSABS 0.1 03/25/2018 0912    ASSESSMENT AND PLAN: 1. Type 2 diabetes mellitus without complication, without long-term current use of insulin (HCC) A1c today is in the range for diabetes. Continue Metformin. Dietary counseling given.  Advised her to cut back on white carbohydrates.  She will try purchasing whole-grain wheat bread and eating brown rice instead of white rice.  Advised to eat more lean white meat instead of red meat or pork.  Continue regular exercise - POCT glucose (manual entry) - POCT glycosylated hemoglobin (Hb A1C) - Microalbumin / creatinine urine ratio - Ambulatory referral to Ophthalmology - Blood Glucose Monitoring Suppl  (ACCU-CHEK GUIDE ME) w/Device KIT; Use as directed  Dispense: 1 kit; Refill: 0 - glucose blood (ACCU-CHEK GUIDE) test strip; Use as instructed  Dispense: 100 each; Refill: 12 - Lancets Misc. (ACCU-CHEK FASTCLIX LANCET) KIT; Use as directed  Dispense: 1 kit; Refill: 6  2. Essential hypertension Close to goal.  Encourage patient to check blood pressure at least twice a week with goal being 130/80 or lower.  Continue current dose of amlodipine for now.  3. Overactive bladder Increase Myrbetriq to 50 mg a day -We will try increasing the dose of Myrbetriq.  Advised patient that she may not achieve 100% improvement in the urinary incontinence.  Advised to use pads when she is going out in public to decrease her fear of having embarrassing episode. - mirabegron ER (MYRBETRIQ) 50 MG TB24 tablet; TAKE 1 TABLET PO daily- Ambulatory referral to Urology  4. Dysuria - Ambulatory referral to Urology - Urinalysis, Routine w reflex microscopic  5. Mild major depression, single episode ([place]) Discussed.  Patient relates this to her ongoing urinary symptom issues.  She declines referral to see a therapist and does not feel that she needs to be on medication at this time.  6. Overweight See #1 above  Patient was given the opportunity to ask questions.  Patient verbalized understanding of the plan and was able to repeat key elements of the plan.  [employer] interpreter used during this encounter. #[id number]  Orders Placed This Encounter  Procedures  . Microalbumin / creatinine urine ratio  . Urinalysis, Routine w reflex microscopic  . Ambulatory referral to Urology  . Ambulatory referral to Ophthalmology  . POCT glucose (manual entry)  . POCT glycosylated hemoglobin (Hb A1C)     Requested Prescriptions   Signed Prescriptions Disp Refills  . Blood Glucose Monitoring Suppl (ACCU-CHEK GUIDE ME) w/Device KIT 1 kit 0    Sig: Use as directed  . glucose blood (ACCU-CHEK GUIDE) test strip 100 each 12    Sig:  Use as instructed  . Lancets Misc. (ACCU-CHEK FASTCLIX LANCET) KIT 1 kit 6    Sig: Use as directed  . mirabegron ER (MYRBETRIQ) 50 MG TB24 tablet 30 tablet 6    Sig: Take 1 tablet (50 mg total) by mouth daily.    Return in about 4 months (around 05/16/2020) for Amy in 1 mth for Medicare Wellness Visit.  Karle Plumber, MD, FACP

## 2020-01-15 NOTE — Patient Instructions (Signed)
Diabetes mellitus y nutricin, en adultos Diabetes Mellitus and Nutrition, Adult Si sufre de diabetes (diabetes mellitus), es muy importante tener hbitos alimenticios saludables debido a que sus niveles de Designer, television/film set sangre (glucosa) se ven afectados en gran medida por lo que come y bebe. [place] alimentos saludables en las cantidades [place], aproximadamente a la [employer], [place] ayudar a:  Aeronautical engineer glucemia.  Disminuir el riesgo de sufrir una enfermedad cardaca.  Mejorar la presin arterial.  Science writer o mantener un peso saludable. Todas las personas que sufren de diabetes son diferentes y cada una tiene necesidades diferentes en cuanto a un plan de alimentacin. El mdico puede recomendarle que trabaje con un especialista en dietas y nutricin (nutricionista) para Financial trader plan para usted. Su plan de alimentacin puede variar segn factores como:  Las caloras que necesita.  Los medicamentos que toma.  Su peso.  Sus niveles de glucemia, presin arterial y colesterol.  Su nivel de [place].  Otras afecciones que tenga, como enfermedades cardacas o renales. Cmo me afectan los carbohidratos? Los carbohidratos, o hidratos de carbono, afectan su nivel de glucemia ms que cualquier otro tipo de alimento. La ingesta de carbohidratos naturalmente aumenta la cantidad de [employer]. El recuento de carbohidratos es un mtodo destinado a Catering manager un registro de la cantidad de carbohidratos que se consumen. El recuento de carbohidratos es importante para Theatre manager la glucemia a un nivel saludable, especialmente si utiliza insulina o toma determinados medicamentos por va oral para la diabetes. Es importante conocer la cantidad de carbohidratos que se pueden ingerir en cada comida sin correr Engineer, manufacturing. Esto es Psychologist, forensic. Su nutricionista puede ayudarlo a calcular la cantidad de carbohidratos que debe ingerir en cada comida y en cada  refrigerio. Entre los alimentos que contienen carbohidratos, se incluyen:  Pan, cereal, arroz, pastas y galletas.  Papas y maz.  Guisantes, frijoles y lentejas.  Leche y Estate agent.  Lambert Mody y [place].  Postres, como pasteles, galletas, helado y caramelos. Cmo me afecta el alcohol? El alcohol puede provocar disminuciones sbitas de la glucemia (hipoglucemia), especialmente si utiliza insulina o toma determinados medicamentos por va oral para la diabetes. La hipoglucemia es una afeccin potencialmente mortal. Los sntomas de la hipoglucemia (somnolencia, mareos y confusin) son similares a los sntomas de haber consumido demasiado alcohol. Si el mdico afirma que el alcohol es seguro para usted, [place] estas pautas:  Limite el consumo de alcohol a no ms de 26mdida por da si es mujer y no est e[place] y a 219midas si es hombre. Una medida equivale a 12oz (35564mde cerveza, 5oz (148m60me vino o 1oz (44ml75m bebidas alcohlicas de alta graduacin.  No beba con el estmago vaco.  Mantngase hidratado bebiendo agua, refrescos dietticos o t helado sin azcar.  Tenga en cuenta que los refrescos comunes, los jugos y otras bebida para mezclOptician, dispensingen contener mucha azcar y se deben contar como carbohidratos. Cules son algunos consejos para seguir este plan?  Leer las etiquetas de los alimentos  Comience por leer el tamao de la porcin en la "Informacin nutricional" en las etiquetas de los alimentos envasados y las bebidas. La cantidad de caloras, carbohidratos, grasas y otros nutrientes mencionados en la etiqueta se basan en una porcin del alimento. Muchos alimentos contienen ms de una porcin por envase.  Verifique la cantidad total de gramos (g) de carbohidratos totales en una porcin. Puede calcular la cantidad de porciones de carbohidratos al dividir el  total de carbohidratos por 15. Por ejemplo, si un alimento tiene un total de 30g de carbohidratos, equivale a 2  porciones de carbohidratos.  Verifique la cantidad de gramos (g) de grasas saturadas y grasas trans en una porcin. Escoja alimentos que no contengan grasa o que tengan un bajo contenido.  Verifique la cantidad de miligramos (mg) de sal (sodio) en una porcin. La [employer] de las personas deben limitar la ingesta de sodio total a menos de 2325m por dTraining and development officer  Siempre consulte la informacin nutricional de los alimentos etiquetados como "con bajo contenido de grasa" o "sin grasa". Estos alimentos pueden tener un mayor contenido de aLocation manageragregada o carbohidratos refinados, y deben evitarse.  Hable con su nutricionista para identificar sus objetivos diarios en cuanto a los nutrientes mencionados en la etiqueta. Al ir de compras  Evite comprar alimentos procesados, enlatados o precocinados. Estos alimentos tienden a tSpecial educational needs teachermayor cantidad de gGerlach sodio y azcar agregada.  Compre en la zona exterior de la tienda de comestibles. Esta zona incluye frutas y verduras frescas, granos a granel, carnes frescas y productos lcteos frescos. Al cocinar  Utilice mtodos de coccin a baja temperatura, como hornear, en lugar de mtodos de coccin a alta temperatura, como frer en abundante aceite.  Cocine con aceites saludables, como el aceite de oPeebles canola o gMilton  Evite cocinar con manteca, crema o carnes con alto contenido de grasa. Planificacin de las comidas  Coma las comidas y los refrigerios regularmente, preferentemente a la misma hora todos lMorrilton Evite pasar largos perodos de tiempo sin comer.  Consuma alimentos ricos en fibra, como frutas frescas, verduras, frijoles y cereales integrales. Consulte a su nutricionista sobre cuntas porciones de carbohidratos puede consumir en cada comida.  Consuma entre 4 y 6 onzas (oz) de protenas magras por da, como carnes mEllsworth pollo, pescado, huevos o tofu. Una onza de protena magra equivale a: ? 1 onza de carne, pollo o  pescado. ? 1huevo. ?  taza de tofu.  Coma algunos alimentos por da que contengan grasas saludables, como aguacates, frutos secos, semillas y pescado. Estilo de vida  Controle su nivel de glucemia con regularidad.  Haga actividad fsica habitualmente como se lo haya indicado el mdico. Esto puede incluir lo siguiente: ? 1551mutos semanales de ejercicio de intensidad moderada o alta. Esto podra incluir caminatas dinmicas, ciclismo o gimnasia acutica. ? Realizar ejercicios de elongacin y de fortalecimiento, como yoga o levantamiento de pesas, por lo menos 2veces por semana.  Tome los meTenneco Ince lo haya indicado el mdico.  No consuma ningn producto que contenga nicotina o tabaco, como cigarrillos y ciPsychologist, sport and exerciseSi necesita ayuda para dejar de fumar, consulte al mdHess Corporationon un asesor o instructor en diabetes para identificar estrategias para controlar el estrs y cualquier desafo emocional y social. Preguntas para hacerle al mdico  Es necesario que consulte a unRadio broadcast assistantn el cuidado de la diabetes?  Es necesario que me rena con un nutricionista?  A qu nmero puedo llamar si tengo preguntas?  Cules son los mejores momentos para controlar la glucemia? Dnde encontrar ms informacin:  Asociacin Estadounidense de la Diabetes (American Diabetes Association): diabetes.org  Academia de Nutricin y DiInformation systems managerAcademy of Nutrition and Dietetics): www.eatright.orAieaiabetes y las Enfermedades Digestivas y Renales (NSt John'S Episcopal Hospital [facility] Shoref Diabetes and Digestive and Kidney Diseases, NIH): wwDesMoinesFuneral.dkesumen  Un plan de alimentacin saludable lo ayudar a coAeronautical engineerlucemia y maTheatre managern estilo de vida saludable.  Trabajar con un especialista en dietas y nutricin (nutricionista) puede ayudarlo a elaborar el mejor plan de alimentacin para usted.  Tenga en cuenta que los carbohidratos (hidratos de  carbono) y el alcohol tienen efectos inmediatos en sus niveles de glucemia. Es importante contar los carbohidratos que ingiere y consumir alcohol con prudencia. Esta informacin no tiene como fin reemplazar el consejo del mdico. Asegrese de hacerle al mdico cualquier pregunta que tenga. Document Revised: 03/16/2017 Document Reviewed: 10/26/2016 Elsevier Patient Education  2020 Elsevier Inc.  

## 2020-01-15 NOTE — Progress Notes (Signed)
Routine check up/   C /o of rash with itching sensation among the panties line X 3 days  unable to hold her urine X 3 yrs /would like to be referred to a specialist

## 2020-01-16 LAB — URINALYSIS, ROUTINE W REFLEX MICROSCOPIC
Bilirubin, UA: NEGATIVE
Glucose, UA: NEGATIVE
Ketones, UA: NEGATIVE
Leukocytes,UA: NEGATIVE
Nitrite, UA: NEGATIVE
Protein,UA: NEGATIVE
RBC, UA: NEGATIVE
Specific Gravity, UA: 1.016 (ref 1.005–1.030)
Urobilinogen, Ur: 0.2 mg/dL (ref 0.2–1.0)
pH, UA: 6 (ref 5.0–7.5)

## 2020-01-16 LAB — MICROALBUMIN / CREATININE URINE RATIO
Creatinine, Urine: 42.6 mg/dL
Microalb/Creat Ratio: <7 mg/g creat (ref 0–29)
Microalbumin, Urine: <3 ug/mL

## 2020-01-18 ENCOUNTER — Encounter: Payer: Medicare HMO | Admitting: Physical Therapy

## 2020-01-25 ENCOUNTER — Telehealth: Payer: Self-pay | Admitting: Internal Medicine

## 2020-01-26 NOTE — Telephone Encounter (Signed)
Patient called in and requested for lab results. Patient was identified by 2 patient identifiers. results given, no further questions or concerns.

## 2020-02-06 ENCOUNTER — Other Ambulatory Visit: Payer: Self-pay | Admitting: Internal Medicine

## 2020-02-06 DIAGNOSIS — K219 Gastro-esophageal reflux disease without esophagitis: Secondary | ICD-10-CM

## 2020-02-06 NOTE — Telephone Encounter (Signed)
Requested Prescriptions  Pending Prescriptions Disp Refills  . DEXILANT 60 MG capsule [Pharmacy Med Name: [place] DR 60 MG CAPSULE] 90 capsule 3    Sig: TOME UNA CAPSULA TODOS LOS DIAS.     Gastroenterology: Proton Pump Inhibitors Passed - 02/06/2020  2:02 AM      Passed - Valid encounter within last 12 months    Recent Outpatient Visits          3 weeks ago Type 2 diabetes mellitus without complication, without long-term current use of insulin ([place])   [place] Ladell Pier, MD   4 months ago Acute pain of left knee   [place], Deborah B, MD   1 year ago Essential hypertension   [place], Deborah B, MD   1 year ago Osteoporosis with current pathological fracture with routine healing, unspecified osteoporosis type, subsequent encounter   [place], Michelle P, NP   1 year ago Prediabetes   [place] Ladell Pier, MD

## 2020-02-24 DIAGNOSIS — Z20822 Contact with and (suspected) exposure to covid-19: Secondary | ICD-10-CM | POA: Diagnosis not present

## 2020-03-06 ENCOUNTER — Encounter (HOSPITAL_COMMUNITY): Payer: Self-pay

## 2020-03-06 ENCOUNTER — Ambulatory Visit (INDEPENDENT_AMBULATORY_CARE_PROVIDER_SITE_OTHER): Payer: Medicare HMO

## 2020-03-06 ENCOUNTER — Ambulatory Visit (HOSPITAL_COMMUNITY)
Admission: EM | Admit: 2020-03-06 | Discharge: 2020-03-06 | Disposition: A | Discharge status: Home / Self Care | Payer: Medicare HMO | Attending: Urgent Care | Admitting: Urgent Care

## 2020-03-06 ENCOUNTER — Other Ambulatory Visit: Payer: Self-pay

## 2020-03-06 DIAGNOSIS — R1032 Left lower quadrant pain: Secondary | ICD-10-CM | POA: Diagnosis not present

## 2020-03-06 DIAGNOSIS — M4125 Other idiopathic scoliosis, thoracolumbar region: Secondary | ICD-10-CM | POA: Diagnosis not present

## 2020-03-06 DIAGNOSIS — M545 Low back pain, unspecified: Secondary | ICD-10-CM

## 2020-03-06 DIAGNOSIS — K449 Diaphragmatic hernia without obstruction or gangrene: Secondary | ICD-10-CM | POA: Diagnosis not present

## 2020-03-06 DIAGNOSIS — M1612 Unilateral primary osteoarthritis, left hip: Secondary | ICD-10-CM | POA: Diagnosis not present

## 2020-03-06 DIAGNOSIS — M419 Scoliosis, unspecified: Secondary | ICD-10-CM | POA: Diagnosis not present

## 2020-03-06 DIAGNOSIS — R269 Unspecified abnormalities of gait and mobility: Secondary | ICD-10-CM

## 2020-03-06 DIAGNOSIS — M25552 Pain in left hip: Secondary | ICD-10-CM

## 2020-03-06 DIAGNOSIS — R109 Unspecified abdominal pain: Secondary | ICD-10-CM

## 2020-03-06 LAB — POCT URINALYSIS DIPSTICK, ED / UC
Bilirubin Urine: NEGATIVE
Glucose, UA: NEGATIVE mg/dL
Ketones, ur: NEGATIVE mg/dL
Leukocytes,Ua: NEGATIVE
Nitrite: NEGATIVE
Protein, ur: NEGATIVE mg/dL
Specific Gravity, Urine: >=1.03 (ref 1.005–1.030)
Urobilinogen, UA: 0.2 mg/dL (ref 0.0–1.0)
pH: 5.5 (ref 5.0–8.0)

## 2020-03-06 MED ORDER — TRAMADOL HCL 50 MG PO TABS
50.0000 mg | ORAL_TABLET | Freq: Four times a day (QID) | ORAL | 0 refills | Status: DC | PRN
Start: 2020-03-06 — End: 2020-12-20

## 2020-03-06 MED ORDER — METHOCARBAMOL 500 MG PO TABS: 500.0000 mg | ORAL_TABLET | Freq: Two times a day (BID) | ORAL | 0 refills | Status: DC

## 2020-03-06 NOTE — ED Provider Notes (Signed)
[place]   MRN: [id number] DOB: [birth year]  Subjective:   Jodi [name] is a 74 y.o. female presenting for 1 week history of recurrent left flank pain that radiates anteriorly toward her left lower side and groin area.  Patient has a history of osteoporosis and chronic pain.  Has been using naproxen for symptom relief.  Denies fever, nausea, vomiting, dysuria, hematuria.  She saw her PCP at the end of June and had a urinalysis done which was negative.  Denies history of kidney stones.  She does have a history of well-controlled diabetes.  Denies falls, trauma.  No current facility-administered medications for this encounter.  Current Outpatient Medications:    metFORMIN (GLUCOPHAGE) 500 MG tablet, TOME UNA TABLETA TODOS LOS DIAS, Disp: 90 tablet, Rfl: 0   ACCU-CHEK SOFTCLIX LANCETS lancets, Use as instructed to test blood glucose twice daily., Disp: 100 each, Rfl: 12   alendronate (FOSAMAX) 70 MG tablet, TAKE 1 TABLET (70 MG TOTAL) BY MOUTH ONCE A WEEK., Disp: 12 tablet, Rfl: 1   amLODipine (NORVASC) 5 MG tablet, Take 0.5 tablets (2.5 mg total) by mouth daily., Disp: 45 tablet, Rfl: 3   atorvastatin (LIPITOR) 10 MG tablet, TOME UNA TABLETA TODOS LOS DIAS, Disp: 90 tablet, Rfl: 1   Blood Glucose Monitoring Suppl (ACCU-CHEK AVIVA PLUS) w/Device KIT, To test blood glucose twice daily., Disp: 1 kit, Rfl: 0   Blood Glucose Monitoring Suppl (ACCU-CHEK GUIDE ME) w/Device KIT, Use as directed, Disp: 1 kit, Rfl: 0   CALCIUM PO, Take 1 tablet by mouth daily., Disp: , Rfl:    DEXILANT 60 MG capsule, TOME UNA CAPSULA TODOS LOS DIAS., Disp: 90 capsule, Rfl: 3   diclofenac Sodium (VOLTAREN) 1 % GEL, APPLY 4 G TOPICALLY 4 (FOUR) TIMES DAILY. LEFT KNEE., Disp: 400 g, Rfl: 1   Elastic Bandages & Supports (LUMBAR BACK BRACE/SUPPORT PAD) MISC, 1 application by Does not apply route daily., Disp: 1 each, Rfl: 0   ferrous sulfate 325 (65 FE) MG tablet, TOME UNA TABLETA TODOS LOS DIAS  CON EL [place], Disp: 90 tablet, Rfl: 1   glucose blood (ACCU-CHEK GUIDE) test strip, Use as instructed, Disp: 100 each, Rfl: 12   hydrochlorothiazide (HYDRODIURIL) 12.5 MG tablet, Take 1 tablet (12.5 mg total) by mouth daily., Disp: 90 tablet, Rfl: 3   Iron-FA-B Cmp-C-Biot-Probiotic (FUSION PLUS) CAPS, Take 1 tablet by mouth every other day. As tolerated, Disp: 90 capsule, Rfl: 2   Lancet Devices (ACCU-CHEK SOFTCLIX) lancets, Use as instructed to test blood glucose twice daily., Disp: 1 each, Rfl: 0   Lancets Misc. (ACCU-CHEK FASTCLIX LANCET) KIT, Use as directed, Disp: 1 kit, Rfl: 6   mirabegron ER (MYRBETRIQ) 50 MG TB24 tablet, Take 1 tablet (50 mg total) by mouth daily., Disp: 30 tablet, Rfl: 6   senna-docusate (SENOKOT-S) 8.6-50 MG tablet, Take 1 tablet by mouth at bedtime as needed for mild constipation., Disp: 120 tablet, Rfl: 3   traMADol (ULTRAM) 50 MG tablet, Take 1 tablet (50 mg total) by mouth every 12 (twelve) hours as needed., Disp: 40 tablet, Rfl: 0   Vitamin D, Cholecalciferol, 10 MCG (400 UNIT) CHEW, Chew 2 tablets (800 Units total) by mouth daily., Disp: 60 tablet, Rfl: 6   Allergies  Allergen Reactions   Cortisone     Rash, swelling   Gabapentin Nausea Only    With dizziness   Pork-Derived Products     Rash, joint pain   Shellfish Allergy Rash and Other (See Comments)  joint pain, GI pain    Past Medical History:  Diagnosis Date   Anemia    Diabetes mellitus without complication ([place])    Hypertension    Insomnia    Polio    in childhood     Past Surgical History:  Procedure Laterality Date   CESAREAN SECTION     SHOULDER SURGERY Left 2010    No family history on file.  Social History   Tobacco Use   Smoking status: Never Smoker   Smokeless tobacco: Never Used  Vaping Use   Vaping Use: Never used  Substance Use Topics   Alcohol use: No   Drug use: No    ROS   Objective:   Vitals: BP 139/75    Pulse 87    Temp  98.6 F (37 C) (Oral)    Resp 18    Ht 5' (1.524 m)    Wt 152 lb (68.9 kg)    SpO2 100%    BMI 29.69 kg/m   Physical Exam Constitutional:      General: She is not in acute distress.    Appearance: Normal appearance. She is well-developed and normal weight. She is not ill-appearing, toxic-appearing or diaphoretic.  HENT:     Head: Normocephalic and atraumatic.     Right Ear: External ear normal.     Left Ear: External ear normal.     Nose: Nose normal.     Mouth/Throat:     Mouth: Mucous membranes are moist.     Pharynx: Oropharynx is clear.  Eyes:     General: No scleral icterus.       Right eye: No discharge.        Left eye: No discharge.     Extraocular Movements: Extraocular movements intact.     Conjunctiva/sclera: Conjunctivae normal.     Pupils: Pupils are equal, round, and reactive to light.  Cardiovascular:     Rate and Rhythm: Normal rate and regular rhythm.     Pulses: Normal pulses.     Heart sounds: Normal heart sounds. No murmur heard.  No friction rub. No gallop.   Pulmonary:     Effort: Pulmonary effort is normal. No respiratory distress.     Breath sounds: Normal breath sounds. No stridor. No wheezing, rhonchi or rales.  Abdominal:     General: Bowel sounds are normal. There is no distension.     Palpations: Abdomen is soft. There is no mass.     Tenderness: There is abdominal tenderness (along area outlined). There is no right CVA tenderness, left CVA tenderness, guarding or rebound.    Musculoskeletal:     Lumbar back: Spasms and tenderness (over area oultined) present. No swelling, edema, deformity, signs of trauma, lacerations or bony tenderness. Normal range of motion. Negative right straight leg raise test and negative left straight leg raise test. No scoliosis.       Back:  Skin:    General: Skin is warm and dry.     Coloration: Skin is not pale.     Findings: No rash.  Neurological:     General: No focal deficit present.     Mental Status: She  is alert and oriented to person, place, and time.  Psychiatric:        Mood and Affect: Mood normal.        Behavior: Behavior normal.        Thought Content: Thought content normal.        Judgment: Judgment  normal.     Results for orders placed or performed during the hospital encounter of 03/06/20 (from the past 24 hour(s))  POC Urinalysis dipstick     Status: Abnormal   Collection Time: 03/06/20  2:58 PM  Result Value Ref Range   Glucose, UA NEGATIVE NEGATIVE mg/dL   Bilirubin Urine NEGATIVE NEGATIVE   Ketones, ur NEGATIVE NEGATIVE mg/dL   Specific Gravity, Urine >=1.030 1.005 - 1.030   Hgb urine dipstick TRACE (A) NEGATIVE   pH 5.5 5.0 - 8.0   Protein, ur NEGATIVE NEGATIVE mg/dL   Urobilinogen, UA 0.2 0.0 - 1.0 mg/dL   Nitrite NEGATIVE NEGATIVE   Leukocytes,Ua NEGATIVE NEGATIVE    DG Abd 2 Views  Result Date: 03/06/2020 CLINICAL DATA:  Lower abdominal and back pain for 1 week, left lower quadrant pain EXAM: ABDOMEN - 2 VIEW COMPARISON:  03/26/2005 FINDINGS: Supine and upright frontal views of the abdomen and pelvis demonstrate an unremarkable bowel gas pattern. There are no masses or abnormal calcifications. Large hiatal hernia is identified. There is right convex scoliosis of the spine centered at the thoracolumbar junction. No acute bony abnormalities. IMPRESSION: 1. Unremarkable bowel gas pattern. 2. Hiatal hernia. 3. Marked right convex scoliosis centered at the thoracolumbar junction. Electronically Signed   By: Randa Ngo M.D.   On: 03/06/2020 15:47   DG Hip Unilat W or Wo Pelvis 2-3 Views Left  Result Date: 03/06/2020 CLINICAL DATA:  Left hip pain, altered gait EXAM: DG HIP (WITH OR WITHOUT PELVIS) 2-3V LEFT COMPARISON:  None. FINDINGS: Frontal and frogleg lateral views of the left hip are obtained. No acute displaced fracture. Alignment is anatomic. Mild osteoarthritis with superior joint space narrowing and osteophyte formation. Soft tissues are unremarkable.  IMPRESSION: 1. Osteoarthritis.  No acute fracture. Electronically Signed   By: Randa Ngo M.D.   On: 03/06/2020 15:48     Assessment and Plan :   PDMP not reviewed this encounter.  1. Left flank pain   2. Acute left-sided low back pain without sciatica   3. Left groin pain   4. Other idiopathic scoliosis, thoracolumbar region   5. Hiatal hernia   6. Osteoarthritis of left hip, unspecified osteoarthritis type     Suspect that her pain is likely related to her significant thoracolumbar scoliosis.  Recommended scheduling Tylenol, use tramadol and Robaxin as needed.  Follow-up with neurosurgery.  Counseled on back care.  Patient has known hiatal hernia and is being managed by GI doctor. Counseled patient on potential for adverse effects with medications prescribed/recommended today, ER and return-to-clinic precautions discussed, patient verbalized understanding.    Jaynee Eagles, PA-C 03/06/20 1606

## 2020-03-06 NOTE — ED Notes (Signed)
Patient transported to X-ray 

## 2020-03-06 NOTE — ED Triage Notes (Signed)
Pt c/o 8/10 constant pain in left hip that radiates to left ribs and groin areax1 week. Pt denies injury. Pt had a slight limp to triage.

## 2020-03-06 NOTE — Discharge Instructions (Addendum)
Please schedule Tylenol at 500 mg - 650 mg once every 6 hours as needed for aches and pains.  If you still have pain despite taking Tylenol regularly, this is breakthrough pain.  You can use tramadol once every 6 hours for this.  Once your pain is better controlled, switch back to just Tylenol.  

## 2020-03-06 NOTE — ED Notes (Signed)
I used interpreter ipad to triage pt

## 2020-03-12 ENCOUNTER — Other Ambulatory Visit: Payer: Self-pay | Admitting: Orthopaedic Surgery

## 2020-03-24 ENCOUNTER — Other Ambulatory Visit: Payer: Self-pay | Admitting: Internal Medicine

## 2020-03-24 DIAGNOSIS — D500 Iron deficiency anemia secondary to blood loss (chronic): Secondary | ICD-10-CM

## 2020-03-24 NOTE — Telephone Encounter (Signed)
Requested Prescriptions  Pending Prescriptions Disp Refills  . ferrous sulfate 325 (65 FE) MG tablet [Pharmacy Med Name: FERROUS SULFATE 325 MG TABLET] 90 tablet 1    Sig: TOME UNA TABLETA TODOS LOS DIAS CON [place]     Endocrinology:  Minerals - Iron Supplementation Failed - 03/24/2020  9:24 AM      Failed - Fe (serum) in normal range and within 360 days    Iron  Date Value Ref Range Status  03/25/2018 70 41 - 142 ug/dL Final   %SAT  Date Value Ref Range Status  04/28/2016 79 (H) 11 - 50 % Final   Saturation Ratios  Date Value Ref Range Status  03/25/2018 25 21 - 57 % Final         Failed - Ferritin in normal range and within 360 days    Ferritin  Date Value Ref Range Status  03/25/2018 135 11 - 307 ng/mL Final    Comment:    Performed at [facility], [place] [address] [place], [place] [address]         Passed - HGB in normal range and within 360 days    Hemoglobin  Date Value Ref Range Status  05/12/2019 11.3 11.1 - 15.9 g/dL Final         Passed - HCT in normal range and within 360 days    Hematocrit  Date Value Ref Range Status  05/12/2019 34.5 34.0 - 46.6 % Final         Passed - RBC in normal range and within 360 days    RBC  Date Value Ref Range Status  05/12/2019 3.82 3.77 - 5.28 x10E6/uL Final  03/25/2018 4.16 3.70 - 5.45 MIL/uL Final   RBC.  Date Value Ref Range Status  03/25/2018 4.15 3.70 - 5.45 MIL/uL Final         Passed - Valid encounter within last 12 months    Recent Outpatient Visits          2 months ago Type 2 diabetes mellitus without complication, without long-term current use of insulin ([place])   [place] Ladell Pier, MD   6 months ago Acute pain of left knee   [place], Deborah B, MD   1 year ago Essential hypertension   [place], Deborah B, MD   1 year ago Osteoporosis with current  pathological fracture with routine healing, unspecified osteoporosis type, subsequent encounter   [place], Michelle P, NP   1 year ago Prediabetes   [place] Ladell Pier, MD

## 2020-03-26 DIAGNOSIS — M545 Low back pain: Secondary | ICD-10-CM | POA: Diagnosis not present

## 2020-03-31 ENCOUNTER — Other Ambulatory Visit: Payer: Self-pay | Admitting: Internal Medicine

## 2020-03-31 NOTE — Telephone Encounter (Signed)
Requested Prescriptions  Pending Prescriptions Disp Refills   metFORMIN (GLUCOPHAGE) 500 MG tablet [Pharmacy Med Name: METFORMIN HCL 500 MG TABLET] 90 tablet 1    Sig: [place] LOS DIAS     Endocrinology:  Diabetes - Biguanides Passed - 03/31/2020  9:14 AM      Passed - Cr in normal range and within 360 days    Creatinine  Date Value Ref Range Status  03/25/2018 0.72 0.44 - 1.00 mg/dL Final   Creat  Date Value Ref Range Status  10/05/2016 0.66 0.60 - 0.93 mg/dL Final    Comment:      For patients > or = 76 years of age: The upper reference limit for Creatinine is approximately 13% higher for people identified as African-American.      Creatinine, Ser  Date Value Ref Range Status  05/12/2019 0.63 0.57 - 1.00 mg/dL Final         Passed - HBA1C is between 0 and 7.9 and within 180 days    Hemoglobin A1C  Date Value Ref Range Status  01/15/2020 6.5 (A) 4.0 - 5.6 % Final   HbA1c POC (<> result, manual entry)  Date Value Ref Range Status  04/08/2018 5.4 4.0 - 5.6 % Final         Passed - eGFR in normal range and within 360 days    GFR, Est African American  Date Value Ref Range Status  10/05/2016 >89 >=60 mL/min Final   GFR, Est AFR Am  Date Value Ref Range Status  03/25/2018 >60 >60 mL/min Final    Comment:    (NOTE) The eGFR has been calculated using the CKD EPI equation. This calculation has not been validated in all clinical situations. eGFR's persistently <60 mL/min signify possible Chronic Kidney Disease.    GFR calc Af Amer  Date Value Ref Range Status  05/12/2019 101 >59 mL/min/1.73 Final   GFR, Est Non African American  Date Value Ref Range Status  10/05/2016 89 >=60 mL/min Final   GFR, Est Non Af Am  Date Value Ref Range Status  03/25/2018 >60 >60 mL/min Final   GFR calc non Af Amer  Date Value Ref Range Status  05/12/2019 88 >59 mL/min/1.73 Final         Passed - Valid encounter within last 6 months    Recent Outpatient Visits           2 months ago Type 2 diabetes mellitus without complication, without long-term current use of insulin ([place])   [place] Karle Plumber B, MD   6 months ago Acute pain of left knee   [place] Ladell Pier, MD   1 year ago Essential hypertension   [place] Ladell Pier, MD   1 year ago Osteoporosis with current pathological fracture with routine healing, unspecified osteoporosis type, subsequent encounter   [place], Michelle P, NP   1 year ago Prediabetes   [place] Ladell Pier, MD             '

## 2020-05-03 ENCOUNTER — Other Ambulatory Visit: Payer: Self-pay | Admitting: Internal Medicine

## 2020-05-03 NOTE — Telephone Encounter (Signed)
Requested medications are due for refill today yes  Requested medications are on the active medication list yes  Last refill 02/08/20  Last visit 12/2019  Future visit scheduled no  Notes to clinic Lipid lab  work is due, no upcoming appt, was seen in June, please assess.

## 2020-05-07 ENCOUNTER — Other Ambulatory Visit: Payer: Self-pay | Admitting: Internal Medicine

## 2020-05-09 ENCOUNTER — Other Ambulatory Visit: Payer: Self-pay | Admitting: Orthopaedic Surgery

## 2020-05-09 NOTE — Telephone Encounter (Signed)
Please advise 

## 2020-05-17 ENCOUNTER — Other Ambulatory Visit: Payer: Self-pay | Admitting: Internal Medicine

## 2020-05-17 DIAGNOSIS — M810 Age-related osteoporosis without current pathological fracture: Secondary | ICD-10-CM

## 2020-07-05 ENCOUNTER — Other Ambulatory Visit: Payer: Self-pay | Admitting: Orthopaedic Surgery

## 2020-08-05 ENCOUNTER — Other Ambulatory Visit: Payer: Self-pay | Admitting: Internal Medicine

## 2020-08-05 DIAGNOSIS — M810 Age-related osteoporosis without current pathological fracture: Secondary | ICD-10-CM

## 2020-09-04 ENCOUNTER — Other Ambulatory Visit: Payer: Self-pay | Admitting: Orthopaedic Surgery

## 2020-09-12 ENCOUNTER — Other Ambulatory Visit: Payer: Self-pay | Admitting: Internal Medicine

## 2020-09-12 DIAGNOSIS — I10 Essential (primary) hypertension: Secondary | ICD-10-CM

## 2020-09-12 NOTE — Telephone Encounter (Signed)
Requested medications are due for refill today yes  Requested medications are on the active medication list yes  Last refill 11/29  Last visit 12/2019  Future visit scheduled No  Notes to clinic OV note states to return in 4 months, Oct 2021, did not return, no upcoming visit scheduled.

## 2020-09-19 ENCOUNTER — Other Ambulatory Visit: Payer: Self-pay | Admitting: Internal Medicine

## 2020-09-19 DIAGNOSIS — N3281 Overactive bladder: Secondary | ICD-10-CM

## 2020-09-23 ENCOUNTER — Other Ambulatory Visit: Payer: Self-pay | Admitting: Internal Medicine

## 2020-09-23 DIAGNOSIS — D500 Iron deficiency anemia secondary to blood loss (chronic): Secondary | ICD-10-CM

## 2020-09-23 NOTE — Telephone Encounter (Signed)
Requested medication (s) are due for refill today: yes  Requested medication (s) are on the active medication list: yes  Last refill: 06/26/2020  Future visit scheduled: no  Notes to clinic:  overdue for appointment and labs  Due for follow up on 05/16/2020 Message left to contact office   Requested Prescriptions  Pending Prescriptions Disp Refills   ferrous sulfate 325 (65 FE) MG tablet [Pharmacy Med Name: FERROUS SULFATE 325 MG TABLET] 90 tablet 1    Sig: TOME UNA TABLETA TODOS LOS [place]      Endocrinology:  Minerals - Iron Supplementation Failed - 09/23/2020  1:32 AM      Failed - HGB in normal range and within 360 days    Hemoglobin  Date Value Ref Range Status  05/12/2019 11.3 11.1 - 15.9 g/dL Final          Failed - HCT in normal range and within 360 days    Hematocrit  Date Value Ref Range Status  05/12/2019 34.5 34.0 - 46.6 % Final          Failed - RBC in normal range and within 360 days    RBC  Date Value Ref Range Status  05/12/2019 3.82 3.77 - 5.28 x10E6/uL Final  03/25/2018 4.16 3.70 - 5.45 MIL/uL Final   RBC.  Date Value Ref Range Status  03/25/2018 4.15 3.70 - 5.45 MIL/uL Final          Failed - Fe (serum) in normal range and within 360 days    Iron  Date Value Ref Range Status  03/25/2018 70 41 - 142 ug/dL Final   %SAT  Date Value Ref Range Status  04/28/2016 79 (H) 11 - 50 % Final   Saturation Ratios  Date Value Ref Range Status  03/25/2018 25 21 - 57 % Final          Failed - Ferritin in normal range and within 360 days    Ferritin  Date Value Ref Range Status  03/25/2018 135 11 - 307 ng/mL Final    Comment:    Performed at [facility], [place] [address] [place], [place] [address]          Passed - Valid encounter within last 12 months    Recent Outpatient Visits           8 months ago Type 2 diabetes mellitus without complication, without long-term current use of insulin [facility])   [place] Ladell Pier, MD   1 year ago Acute pain of left knee   [place], Deborah B, MD   1 year ago Essential hypertension   [place], Deborah B, MD   1 year ago Osteoporosis with current pathological fracture with routine healing, unspecified osteoporosis type, subsequent encounter   [place], Michelle P, NP   2 years ago Prediabetes   [place] Ladell Pier, MD

## 2020-09-25 DIAGNOSIS — H524 Presbyopia: Secondary | ICD-10-CM | POA: Diagnosis not present

## 2020-09-25 DIAGNOSIS — H52209 Unspecified astigmatism, unspecified eye: Secondary | ICD-10-CM | POA: Diagnosis not present

## 2020-09-25 DIAGNOSIS — H5203 Hypermetropia, bilateral: Secondary | ICD-10-CM | POA: Diagnosis not present

## 2020-09-26 ENCOUNTER — Other Ambulatory Visit: Payer: Self-pay | Admitting: Internal Medicine

## 2020-09-26 NOTE — Telephone Encounter (Signed)
Courtesy refill - patient needs appointment. Requested Prescriptions  Pending Prescriptions Disp Refills  . metFORMIN (GLUCOPHAGE) 500 MG tablet [Pharmacy Med Name: METFORMIN HCL 500 MG TABLET] 30 tablet 0    Sig: TOME UNA [place] LOS DIAS     Endocrinology:  Diabetes - Biguanides Failed - 09/26/2020  1:23 AM      Failed - Cr in normal range and within 360 days    Creatinine  Date Value Ref Range Status  03/25/2018 0.72 0.44 - 1.00 mg/dL Final   Creat  Date Value Ref Range Status  10/05/2016 0.66 0.60 - 0.93 mg/dL Final    Comment:      For patients > or = 77 years of age: The upper reference limit for Creatinine is approximately 13% higher for people identified as African-American.      Creatinine, Ser  Date Value Ref Range Status  05/12/2019 0.63 0.57 - 1.00 mg/dL Final         Failed - HBA1C is between 0 and 7.9 and within 180 days    Hemoglobin A1C  Date Value Ref Range Status  01/15/2020 6.5 (A) 4.0 - 5.6 % Final   HbA1c POC (<> result, manual entry)  Date Value Ref Range Status  04/08/2018 5.4 4.0 - 5.6 % Final         Failed - eGFR in normal range and within 360 days    GFR, Est African American  Date Value Ref Range Status  10/05/2016 >89 >=60 mL/min Final   GFR, Est AFR Am  Date Value Ref Range Status  03/25/2018 >60 >60 mL/min Final    Comment:    (NOTE) The eGFR has been calculated using the CKD EPI equation. This calculation has not been validated in all clinical situations. eGFR's persistently <60 mL/min signify possible Chronic Kidney Disease.    GFR calc Af Amer  Date Value Ref Range Status  05/12/2019 101 >59 mL/min/1.73 Final   GFR, Est Non African American  Date Value Ref Range Status  10/05/2016 89 >=60 mL/min Final   GFR, Estimated  Date Value Ref Range Status  03/25/2018 >60 >60 mL/min Final   GFR calc non Af Amer  Date Value Ref Range Status  05/12/2019 88 >59 mL/min/1.73 Final         Failed - Valid encounter within  last 6 months    Recent Outpatient Visits          8 months ago Type 2 diabetes mellitus without complication, without long-term current use of insulin ([place])   [place] Ladell Pier, MD   1 year ago Acute pain of left knee   [place], Deborah B, MD   1 year ago Essential hypertension   [place], Deborah B, MD   1 year ago Osteoporosis with current pathological fracture with routine healing, unspecified osteoporosis type, subsequent encounter   [place], Michelle P, NP   2 years ago Prediabetes   [place] Ladell Pier, MD

## 2020-10-17 ENCOUNTER — Other Ambulatory Visit: Payer: Self-pay | Admitting: Internal Medicine

## 2020-10-17 NOTE — Telephone Encounter (Signed)
Patient needs to make an appointment for further refills. Requested Prescriptions  Pending Prescriptions Disp Refills  . atorvastatin (LIPITOR) 10 MG tablet [Pharmacy Med Name: ATORVASTATIN 10 MG TABLET] 90 tablet 0    Sig: TOME UNA TABLETA TODOS LOS [place]     Cardiovascular:  Antilipid - Statins Failed - 10/17/2020  2:04 AM      Failed - Total Cholesterol in normal range and within 360 days    Cholesterol, Total  Date Value Ref Range Status  05/12/2019 238 (H) 100 - 199 mg/dL Final         Failed - LDL in normal range and within 360 days    LDL Chol Calc (NIH)  Date Value Ref Range Status  05/12/2019 148 (H) 0 - 99 mg/dL Final         Failed - HDL in normal range and within 360 days    HDL  Date Value Ref Range Status  05/12/2019 63 >39 mg/dL Final         Failed - Triglycerides in normal range and within 360 days    Triglycerides  Date Value Ref Range Status  05/12/2019 152 (H) 0 - 149 mg/dL Final         Passed - Patient is not pregnant      Passed - Valid encounter within last 12 months    Recent Outpatient Visits          9 months ago Type 2 diabetes mellitus without complication, without long-term current use of insulin ([place])   [place] Ladell Pier, MD   1 year ago Acute pain of left knee   [place], Deborah B, MD   1 year ago Essential hypertension   [place], Deborah B, MD   1 year ago Osteoporosis with current pathological fracture with routine healing, unspecified osteoporosis type, subsequent encounter   [place], Michelle P, NP   2 years ago Prediabetes   [place] Ladell Pier, MD

## 2020-10-19 ENCOUNTER — Other Ambulatory Visit: Payer: Self-pay | Admitting: Internal Medicine

## 2020-10-19 NOTE — Telephone Encounter (Signed)
Requested medication (s) are due for refill today: yes  Requested medication (s) are on the active medication list: yes  Last refill:  09/26/20 (courtesy refill)  Future visit scheduled: no- Using ([employer] (# [phone]), called pt and LM on VM  to call office to schedule appt  Notes to clinic:  Needs appt and lab work   Requested Prescriptions  Pending Prescriptions Disp Refills   metFORMIN (GLUCOPHAGE) 500 MG tablet [Pharmacy Med Name: METFORMIN HCL 500 MG TABLET] 30 tablet 0    Sig: [place]      Endocrinology:  Diabetes - Biguanides Failed - 10/19/2020 11:31 AM      Failed - Cr in normal range and within 360 days    Creatinine  Date Value Ref Range Status  03/25/2018 0.72 0.44 - 1.00 mg/dL Final   Creat  Date Value Ref Range Status  10/05/2016 0.66 0.60 - 0.93 mg/dL Final    Comment:      For patients > or = 77 years of age: The upper reference limit for Creatinine is approximately 13% higher for people identified as African-American.      Creatinine, Ser  Date Value Ref Range Status  05/12/2019 0.63 0.57 - 1.00 mg/dL Final          Failed - HBA1C is between 0 and 7.9 and within 180 days    Hemoglobin A1C  Date Value Ref Range Status  01/15/2020 6.5 (A) 4.0 - 5.6 % Final   HbA1c POC (<> result, manual entry)  Date Value Ref Range Status  04/08/2018 5.4 4.0 - 5.6 % Final          Failed - eGFR in normal range and within 360 days    GFR, Est African American  Date Value Ref Range Status  10/05/2016 >89 >=60 mL/min Final   GFR, Est AFR Am  Date Value Ref Range Status  03/25/2018 >60 >60 mL/min Final    Comment:    (NOTE) The eGFR has been calculated using the CKD EPI equation. This calculation has not been validated in all clinical situations. eGFR's persistently <60 mL/min signify possible Chronic Kidney Disease.    GFR calc Af Amer  Date Value Ref Range Status  05/12/2019 101 >59 mL/min/1.73 Final    GFR, Est Non African American  Date Value Ref Range Status  10/05/2016 89 >=60 mL/min Final   GFR, Estimated  Date Value Ref Range Status  03/25/2018 >60 >60 mL/min Final   GFR calc non Af Amer  Date Value Ref Range Status  05/12/2019 88 >59 mL/min/1.73 Final          Failed - Valid encounter within last 6 months    Recent Outpatient Visits           9 months ago Type 2 diabetes mellitus without complication, without long-term current use of insulin ([place])   [place] Ladell Pier, MD   1 year ago Acute pain of left knee   [place], Deborah B, MD   1 year ago Essential hypertension   [place], Deborah B, MD   1 year ago Osteoporosis with current pathological fracture with routine healing, unspecified osteoporosis type, subsequent encounter   [place], Michelle P, NP   2 years ago Prediabetes   [place] Ladell Pier, MD

## 2020-10-26 ENCOUNTER — Other Ambulatory Visit: Payer: Self-pay | Admitting: Internal Medicine

## 2020-10-26 DIAGNOSIS — I10 Essential (primary) hypertension: Secondary | ICD-10-CM

## 2020-10-26 NOTE — Telephone Encounter (Signed)
Requested Prescriptions  Pending Prescriptions Disp Refills  . hydrochlorothiazide (HYDRODIURIL) 12.5 MG tablet [Pharmacy Med Name: HYDROCHLOROTHIAZIDE 12.5 MG TB] 30 tablet 0    Sig: TOME UNA TABLETA TODOS LOS [place]     Cardiovascular: Diuretics - Thiazide Failed - 10/26/2020  9:02 AM      Failed - Ca in normal range and within 360 days    Calcium  Date Value Ref Range Status  05/12/2019 9.3 8.7 - 10.3 mg/dL Final         Failed - Cr in normal range and within 360 days    Creatinine  Date Value Ref Range Status  03/25/2018 0.72 0.44 - 1.00 mg/dL Final   Creat  Date Value Ref Range Status  10/05/2016 0.66 0.60 - 0.93 mg/dL Final    Comment:      For patients > or = 77 years of age: The upper reference limit for Creatinine is approximately 13% higher for people identified as African-American.      Creatinine, Ser  Date Value Ref Range Status  05/12/2019 0.63 0.57 - 1.00 mg/dL Final         Failed - K in normal range and within 360 days    Potassium  Date Value Ref Range Status  05/12/2019 4.3 3.5 - 5.2 mmol/L Final         Failed - Na in normal range and within 360 days    Sodium  Date Value Ref Range Status  05/12/2019 139 134 - 144 mmol/L Final         Failed - Valid encounter within last 6 months    Recent Outpatient Visits          9 months ago Type 2 diabetes mellitus without complication, without long-term current use of insulin ([place])   [place] Ladell Pier, MD   1 year ago Acute pain of left knee   [place], Deborah B, MD   1 year ago Essential hypertension   [place], Deborah B, MD   1 year ago Osteoporosis with current pathological fracture with routine healing, unspecified osteoporosis type, subsequent encounter   [place], Michelle P, NP   2 years ago Prediabetes   [place] Ladell Pier, MD             Passed - Last BP in normal range    BP Readings from Last 1 Encounters:  03/06/20 139/75

## 2020-11-07 ENCOUNTER — Other Ambulatory Visit: Payer: Self-pay | Admitting: Internal Medicine

## 2020-11-07 NOTE — Telephone Encounter (Signed)
Requested medications are due for refill today.  yes  Requested medications are on the active medications list.  yes  Last refill. 09/26/2020  Future visit scheduled.   no  Notes to clinic.  Courtesy refill already given.

## 2020-11-19 ENCOUNTER — Other Ambulatory Visit: Payer: Self-pay | Admitting: Internal Medicine

## 2020-11-19 DIAGNOSIS — I10 Essential (primary) hypertension: Secondary | ICD-10-CM

## 2020-11-19 NOTE — Telephone Encounter (Signed)
Requested medication (s) are due for refill today - yes  Requested medication (s) are on the active medication list -yes  Future visit scheduled -yes  Last refill: 10/26/20  Notes to clinic: Patient has scheduled appointment 01/07/21- courtesy RF has already been given-request RF until appointment  Requested Prescriptions  Pending Prescriptions Disp Refills   hydrochlorothiazide (HYDRODIURIL) 12.5 MG tablet [Pharmacy Med Name: HYDROCHLOROTHIAZIDE 12.5 MG TB] 30 tablet 0    Sig: TOME UNA TABLETA TODOS LOS [place]      Cardiovascular: Diuretics - Thiazide Failed - 11/19/2020  1:33 PM      Failed - Ca in normal range and within 360 days    Calcium  Date Value Ref Range Status  05/12/2019 9.3 8.7 - 10.3 mg/dL Final          Failed - Cr in normal range and within 360 days    Creatinine  Date Value Ref Range Status  03/25/2018 0.72 0.44 - 1.00 mg/dL Final   Creat  Date Value Ref Range Status  10/05/2016 0.66 0.60 - 0.93 mg/dL Final    Comment:      For patients > or = 77 years of age: The upper reference limit for Creatinine is approximately 13% higher for people identified as African-American.      Creatinine, Ser  Date Value Ref Range Status  05/12/2019 0.63 0.57 - 1.00 mg/dL Final          Failed - K in normal range and within 360 days    Potassium  Date Value Ref Range Status  05/12/2019 4.3 3.5 - 5.2 mmol/L Final          Failed - Na in normal range and within 360 days    Sodium  Date Value Ref Range Status  05/12/2019 139 134 - 144 mmol/L Final          Failed - Valid encounter within last 6 months    Recent Outpatient Visits           10 months ago Type 2 diabetes mellitus without complication, without long-term current use of insulin ([place])   [place] Ladell Pier, MD   1 year ago Acute pain of left knee   [place], Deborah B, MD   1 year ago Essential hypertension   [place], Deborah B, MD   1 year ago Osteoporosis with current pathological fracture with routine healing, unspecified osteoporosis type, subsequent encounter   [place], Michelle P, NP   2 years ago Prediabetes   [place], MD       Future Appointments             In 1 month Ladell Pier, MD [place] - Last BP in normal range    BP Readings from Last 1 Encounters:  03/06/20 139/75              Requested Prescriptions  Pending Prescriptions Disp Refills   hydrochlorothiazide (HYDRODIURIL) 12.5 MG tablet [Pharmacy Med Name: HYDROCHLOROTHIAZIDE 12.5 MG TB] 30 tablet 0    Sig: TOME UNA TABLETA TODOS LOS DI[place]      Cardiovascular: Diuretics - Thiazide Failed - 11/19/2020  1:33 PM      Failed - Ca in normal  range and within 360 days    Calcium  Date Value Ref Range Status  05/12/2019 9.3 8.7 - 10.3 mg/dL Final          Failed - Cr in normal range and within 360 days    Creatinine  Date Value Ref Range Status  03/25/2018 0.72 0.44 - 1.00 mg/dL Final   Creat  Date Value Ref Range Status  10/05/2016 0.66 0.60 - 0.93 mg/dL Final    Comment:      For patients > or = 77 years of age: The upper reference limit for Creatinine is approximately 13% higher for people identified as African-American.      Creatinine, Ser  Date Value Ref Range Status  05/12/2019 0.63 0.57 - 1.00 mg/dL Final          Failed - K in normal range and within 360 days    Potassium  Date Value Ref Range Status  05/12/2019 4.3 3.5 - 5.2 mmol/L Final          Failed - Na in normal range and within 360 days    Sodium  Date Value Ref Range Status  05/12/2019 139 134 - 144 mmol/L Final          Failed - Valid encounter within last 6 months    Recent Outpatient Visits           10 months ago Type 2  diabetes mellitus without complication, without long-term current use of insulin ([place])   [place] Ladell Pier, MD   1 year ago Acute pain of left knee   [place], Deborah B, MD   1 year ago Essential hypertension   [place], Deborah B, MD   1 year ago Osteoporosis with current pathological fracture with routine healing, unspecified osteoporosis type, subsequent encounter   [place], Michelle P, NP   2 years ago Prediabetes   [place], MD       Future Appointments             In 1 month Ladell Pier, MD [place] - Last BP in normal range    BP Readings from Last 1 Encounters:  03/06/20 139/75

## 2020-11-23 ENCOUNTER — Other Ambulatory Visit: Payer: Self-pay | Admitting: Internal Medicine

## 2020-11-23 NOTE — Telephone Encounter (Signed)
Requested Prescriptions  Pending Prescriptions Disp Refills  . metFORMIN (GLUCOPHAGE) 500 MG tablet [Pharmacy Med Name: METFORMIN HCL 500 MG TABLET] 45 tablet 0    Sig: TOME UNA [place] LOS DIAS     Endocrinology:  Diabetes - Biguanides Failed - 11/23/2020  9:45 AM      Failed - Cr in normal range and within 360 days    Creatinine  Date Value Ref Range Status  03/25/2018 0.72 0.44 - 1.00 mg/dL Final   Creat  Date Value Ref Range Status  10/05/2016 0.66 0.60 - 0.93 mg/dL Final    Comment:      For patients > or = 77 years of age: The upper reference limit for Creatinine is approximately 13% higher for people identified as African-American.      Creatinine, Ser  Date Value Ref Range Status  05/12/2019 0.63 0.57 - 1.00 mg/dL Final         Failed - HBA1C is between 0 and 7.9 and within 180 days    Hemoglobin A1C  Date Value Ref Range Status  01/15/2020 6.5 (A) 4.0 - 5.6 % Final   HbA1c POC (<> result, manual entry)  Date Value Ref Range Status  04/08/2018 5.4 4.0 - 5.6 % Final         Failed - eGFR in normal range and within 360 days    GFR, Est African American  Date Value Ref Range Status  10/05/2016 >89 >=60 mL/min Final   GFR, Est AFR Am  Date Value Ref Range Status  03/25/2018 >60 >60 mL/min Final    Comment:    (NOTE) The eGFR has been calculated using the CKD EPI equation. This calculation has not been validated in all clinical situations. eGFR's persistently <60 mL/min signify possible Chronic Kidney Disease.    GFR calc Af Amer  Date Value Ref Range Status  05/12/2019 101 >59 mL/min/1.73 Final   GFR, Est Non African American  Date Value Ref Range Status  10/05/2016 89 >=60 mL/min Final   GFR, Estimated  Date Value Ref Range Status  03/25/2018 >60 >60 mL/min Final   GFR calc non Af Amer  Date Value Ref Range Status  05/12/2019 88 >59 mL/min/1.73 Final         Failed - Valid encounter within last 6 months    Recent Outpatient Visits           10 months ago Type 2 diabetes mellitus without complication, without long-term current use of insulin ([place])   [place] Ladell Pier, MD   1 year ago Acute pain of left knee   [place], Deborah B, MD   1 year ago Essential hypertension   [place], Deborah B, MD   1 year ago Osteoporosis with current pathological fracture with routine healing, unspecified osteoporosis type, subsequent encounter   [place], Michelle P, NP   2 years ago Prediabetes   [place], MD      Future Appointments            In 1 month [place] Emery, Dalbert Batman, MD [place]

## 2020-12-15 ENCOUNTER — Other Ambulatory Visit: Payer: Self-pay | Admitting: Internal Medicine

## 2020-12-15 DIAGNOSIS — I10 Essential (primary) hypertension: Secondary | ICD-10-CM

## 2020-12-15 NOTE — Telephone Encounter (Signed)
Requested medication (s) are due for refill today: yes  Requested medication (s) are on the active medication list: yes  Last refill:  11/19/20 #30  Future visit scheduled: yes  Notes to clinic:  overdue labs   Requested Prescriptions  Pending Prescriptions Disp Refills   hydrochlorothiazide (HYDRODIURIL) 12.5 MG tablet [Pharmacy Med Name: HYDROCHLOROTHIAZIDE 12.5 MG TB] 30 tablet 0    Sig: TOME UNA TABLETA TODOS LOS DIAS      Cardiovascular: Diuretics - Thiazide Failed - 12/15/2020  1:30 PM      Failed - Ca in normal range and within 360 days    Calcium  Date Value Ref Range Status  05/12/2019 9.3 8.7 - 10.3 mg/dL Final          Failed - Cr in normal range and within 360 days    Creatinine  Date Value Ref Range Status  03/25/2018 0.72 0.44 - 1.00 mg/dL Final   Creat  Date Value Ref Range Status  10/05/2016 0.66 0.60 - 0.93 mg/dL Final    Comment:      For patients > or = 77 years of age: The upper reference limit for Creatinine is approximately 13% higher for people identified as African-American.      Creatinine, Ser  Date Value Ref Range Status  05/12/2019 0.63 0.57 - 1.00 mg/dL Final          Failed - K in normal range and within 360 days    Potassium  Date Value Ref Range Status  05/12/2019 4.3 3.5 - 5.2 mmol/L Final          Failed - Na in normal range and within 360 days    Sodium  Date Value Ref Range Status  05/12/2019 139 134 - 144 mmol/L Final          Failed - Valid encounter within last 6 months    Recent Outpatient Visits           11 months ago Type 2 diabetes mellitus without complication, without long-term current use of insulin ([place])   [place] Ladell Pier, MD   1 year ago Acute pain of left knee   [place], Deborah B, MD   1 year ago Essential hypertension   [place], Deborah B, MD   1 year ago Osteoporosis  with current pathological fracture with routine healing, unspecified osteoporosis type, subsequent encounter   [place], Michelle P, NP   2 years ago Prediabetes   [place], MD       Future Appointments             In 3 weeks Ladell Pier, MD [place] - Last BP in normal range    BP Readings from Last 1 Encounters:  03/06/20 139/75

## 2020-12-20 ENCOUNTER — Ambulatory Visit (HOSPITAL_COMMUNITY)
Admission: EM | Admit: 2020-12-20 | Discharge: 2020-12-20 | Disposition: A | Discharge status: Home / Self Care | Payer: Medicare HMO | Attending: Physician Assistant | Admitting: Physician Assistant

## 2020-12-20 ENCOUNTER — Encounter (HOSPITAL_COMMUNITY): Payer: Self-pay

## 2020-12-20 ENCOUNTER — Ambulatory Visit: Payer: Self-pay | Admitting: *Deleted

## 2020-12-20 ENCOUNTER — Other Ambulatory Visit: Payer: Self-pay

## 2020-12-20 DIAGNOSIS — B029 Zoster without complications: Secondary | ICD-10-CM

## 2020-12-20 DIAGNOSIS — R21 Rash and other nonspecific skin eruption: Secondary | ICD-10-CM | POA: Diagnosis not present

## 2020-12-20 MED ORDER — VALACYCLOVIR HCL 1 G PO TABS: 1000.0000 mg | ORAL_TABLET | Freq: Three times a day (TID) | ORAL | 0 refills | Status: AC

## 2020-12-20 MED ORDER — HYDROCODONE-ACETAMINOPHEN 5-325 MG PO TABS
1.0000 | ORAL_TABLET | Freq: Four times a day (QID) | ORAL | 0 refills | Status: DC | PRN
Start: 2020-12-20 — End: 2021-04-11

## 2020-12-20 NOTE — ED Provider Notes (Signed)
[place]    CSN: [id number] Arrival date & time: 12/20/20  1028      History   Chief Complaint Chief Complaint  Patient presents with  . Rash    HPI Jodi [name] is a 75 y.o. female.   Patient presents today with a several day history of painful rash on right abdomen.  Patient is Spanish-speaking and phone interpreter was utilized during visit.  Patient reports current symptoms began a few days ago and have gradually progressed.  Rash has improved and is no longer vesicular but she continues to have pain.  She had an episode of similar symptoms several years ago that was diagnosed as shingles and she was prescribed hydrocodone to help manage pain.  She is requesting refill of this medication today.  She is unsure if she has had Shingrix in the past and we have no record of this vaccine.  She denies any history of immunosuppression.  She reports pain is rated 7 on a 0-10 pain scale, described as burning/stinging, no aggravating or alleviating factors identified.  She has tried over-the-counter medications without improvement of symptoms.  She denies any changes to medication, soaps, detergents.  Denies additional symptoms including fever, nausea, vomiting, headache, vision changes.     Past Medical History:  Diagnosis Date  . Anemia   . Diabetes mellitus without complication ([place])   . Hypertension   . Insomnia   . Polio    in childhood    Patient Active Problem List   Diagnosis Date Noted  . Mild major depression, single episode ([place]) 01/15/2020  . Overweight 01/15/2020  . Overactive bladder 01/15/2020  . Hyperlipidemia 05/13/2019  . Age-related osteoporosis without current pathological fracture 03/08/2019  . Chronic pain 01/01/2019  . Medication refill 01/01/2019  . Symptomatic anemia   . Iron deficiency anemia due to chronic blood loss 10/14/2017  . HTN (hypertension), benign 03/16/2017  . Primary insomnia 03/16/2017  . Osteoporosis 01/08/2017  .  GERD (gastroesophageal reflux disease) 01/08/2017  . Vitamin D deficiency 01/08/2017  . Edema 03/13/2014    Past Surgical History:  Procedure Laterality Date  . CESAREAN SECTION    . SHOULDER SURGERY Left 2010    OB History   No obstetric history on file.      Home Medications    Prior to Admission medications   Medication Sig Start Date End Date Taking? Authorizing Provider  HYDROcodone-acetaminophen (NORCO/VICODIN) 5-325 MG tablet Take 1 tablet by mouth every 6 (six) hours as needed for up to 4 doses. 12/20/20  Yes Ishika Chesterfield, Derry Skill, PA-C  valACYclovir (VALTREX) 1000 MG tablet Take 1 tablet (1,000 mg total) by mouth 3 (three) times daily for 7 days. 12/20/20 12/27/20 Yes Devaeh Amadi, Derry Skill, PA-C  ACCU-CHEK SOFTCLIX LANCETS lancets Use as instructed to test blood glucose twice daily. 07/15/16   Maren Reamer, MD  alendronate (FOSAMAX) 70 MG tablet TAKE 1 TABLET (70 MG TOTAL) BY MOUTH ONCE A WEEK. 08/05/20   Ladell Pier, MD  amLODipine (NORVASC) 5 MG tablet Take 0.5 tablets (2.5 mg total) by mouth daily. 09/15/19   Ladell Pier, MD  atorvastatin (LIPITOR) 10 MG tablet TOME UNA TABLETA TODOS LOS DIAS 10/17/20   Ladell Pier, MD  Blood Glucose Monitoring Suppl (ACCU-CHEK AVIVA PLUS) w/Device KIT To test blood glucose twice daily. 07/15/16   Maren Reamer, MD  Blood Glucose Monitoring Suppl (ACCU-CHEK GUIDE ME) w/Device KIT Use as directed 01/15/20   Ladell Pier, MD  CALCIUM PO Take 1 tablet by mouth daily.    [provider]  DEXILANT 60 MG capsule TOME UNA CAPSULA TODOS LOS DIAS. 02/06/20   Ladell Pier, MD  diclofenac Sodium (VOLTAREN) 1 % GEL APPLY 4 G TOPICALLY 4 (FOUR) TIMES DAILY. LEFT KNEE. 09/04/20   Mcarthur Rossetti, MD  Elastic Bandages & Supports (LUMBAR BACK BRACE/SUPPORT PAD) MISC 1 application by Does not apply route daily. 04/02/16   Maren Reamer, MD  ferrous sulfate 325 (65 FE) MG tablet TOME UNA TABLETA TODOS LOS DIAS CON EL  DESAYUNO 03/24/20   Ladell Pier, MD  glucose blood (ACCU-CHEK GUIDE) test strip Use as instructed 01/15/20   Ladell Pier, MD  hydrochlorothiazide (HYDRODIURIL) 12.5 MG tablet TOME UNA TABLETA TODOS LOS DIAS 12/17/20   Ladell Pier, MD  Iron-FA-B Cmp-C-Biot-Probiotic (FUSION PLUS) CAPS Take 1 tablet by mouth every other day. As tolerated 10/10/16   Langeland, Dawn T, MD  Lancet Devices [facility]) lancets Use as instructed to test blood glucose twice daily. 07/15/16   Maren Reamer, MD  Lancets Misc. (ACCU-CHEK FASTCLIX LANCET) KIT Use as directed 01/15/20   Ladell Pier, MD  metFORMIN (GLUCOPHAGE) 500 MG tablet TOME UNA TABLETA TODOS LOS DIAS 11/23/20   Ladell Pier, MD  methocarbamol (ROBAXIN) 500 MG tablet Take 1 tablet (500 mg total) by mouth 2 (two) times daily. 03/06/20   Jaynee Eagles, PA-C  MYRBETRIQ 50 MG TB24 tablet TOME UNA TABLETA TODOS LOS DIAS 09/19/20   Ladell Pier, MD  senna-docusate (SENOKOT-S) 8.6-50 MG tablet Take 1 tablet by mouth at bedtime as needed for mild constipation. 10/07/16   Maren Reamer, MD  Vitamin D, Cholecalciferol, 10 MCG (400 UNIT) CHEW Chew 2 tablets (800 Units total) by mouth daily. 09/15/19   Ladell Pier, MD    Family History Family History  Family history unknown: Yes    Social History Social History   Tobacco Use  . Smoking status: Never Smoker  . Smokeless tobacco: Never Used  Vaping Use  . Vaping Use: Never used  Substance Use Topics  . Alcohol use: No  . Drug use: No     Allergies   Cortisone, Gabapentin, Pork-derived products, and Shellfish allergy   Review of Systems Review of Systems  Constitutional: Positive for activity change. Negative for appetite change, fatigue and fever.  Respiratory: Negative for cough and shortness of breath.   Cardiovascular: Negative for chest pain.  Gastrointestinal: Negative for abdominal pain, diarrhea, nausea and vomiting.  Musculoskeletal: Negative  for arthralgias and myalgias.  Skin: Positive for rash. Negative for color change.  Neurological: Negative for dizziness, weakness, light-headedness, numbness and headaches.     Physical Exam Triage Vital Signs ED Triage Vitals  Enc Vitals Group     BP 12/20/20 1144 (!) 152/94     Pulse Rate 12/20/20 1144 88     Resp 12/20/20 1144 18     Temp 12/20/20 1144 98.1 F (36.7 C)     Temp Source 12/20/20 1144 Oral     SpO2 12/20/20 1144 100 %     Weight --      Height --      Head Circumference --      Peak Flow --      Pain Score 12/20/20 1145 7     Pain Loc --      Pain Edu? --      Excl. in GC? --    No  data found.  Updated Vital Signs BP (!) 152/94 (BP Location: Left Arm) Comment: did not take medication today  Pulse 88   Temp 98.1 F (36.7 C) (Oral)   Resp 18   SpO2 100%   Visual Acuity Right Eye Distance:   Left Eye Distance:   Bilateral Distance:    Right Eye Near:   Left Eye Near:    Bilateral Near:     Physical Exam Vitals reviewed.  Constitutional:      General: She is awake. She is not in acute distress.    Appearance: Normal appearance. She is normal weight. She is not ill-appearing.     Comments: Very pleasant female appears stated age in no acute distress  HENT:     Head: Normocephalic and atraumatic.     Mouth/Throat:     Mouth: Mucous membranes are moist.     Pharynx: Uvula midline. No oropharyngeal exudate or posterior oropharyngeal erythema.  Cardiovascular:     Rate and Rhythm: Normal rate and regular rhythm.     Heart sounds: Normal heart sounds. No murmur heard.   Pulmonary:     Effort: Pulmonary effort is normal.     Breath sounds: Normal breath sounds. No wheezing, rhonchi or rales.     Comments: Clear to auscultation bilaterally Abdominal:     Palpations: Abdomen is soft.     Tenderness: There is no abdominal tenderness.  Skin:    Findings: Rash present. Rash is macular and papular.          Comments: Maculopapular rash in T11  distribution.  No vesicles noted on exam.  Psychiatric:        Behavior: Behavior is cooperative.      UC Treatments / Results  Labs (all labs ordered are listed, but only abnormal results are displayed) Labs Reviewed - No data to display  EKG   Radiology No results found.  Procedures Procedures (including critical care time)  Medications Ordered in UC Medications - No data to display  Initial Impression / Assessment and Plan / UC Course  I have reviewed the triage vital signs and the nursing notes.  Pertinent labs & imaging results that were available during my care of the patient were reviewed by me and considered in my medical decision making (see chart for details).     Rash is not vesicular but given presentation with associated prodromal pain we will treat for herpes zoster.  She was prescribed Valtrex to be used 3 times a day for 1 week.  She was given 4 doses of hydrocodone to use as needed for pain with instruction not to drive or drink alcohol with this medication as drowsiness is a common side effect.  Discussed that if symptoms persist she should follow-up with her primary care to consider gabapentin or other medication.  Strict return precautions given to which patient expressed understanding.  Final Clinical Impressions(s) / UC Diagnoses   Final diagnoses:  Herpes zoster without complication  Rash     Discharge Instructions     Take Valtrex 3 times a day to treat shingles.  I have called in 4 doses of hydrocodone to help with pain.  This can make you sleepy do not drive or drink alcohol with it.  If you have any worsening symptoms please return for reevaluation.  Follow-up with your primary care provider within 1 week for reevaluation.    ED Prescriptions    Medication Sig Dispense Auth. Provider   valACYclovir (VALTREX) 1000 MG  tablet Take 1 tablet (1,000 mg total) by mouth 3 (three) times daily for 7 days. 21 tablet [place] Pam K, PA-C    HYDROcodone-acetaminophen (NORCO/VICODIN) 5-325 MG tablet Take 1 tablet by mouth every 6 (six) hours as needed for up to 4 doses. 4 tablet Jaeceon Michelin K, PA-C     I have reviewed the PDMP during this encounter.   Terrilee Croak, PA-C 12/20/20 1207

## 2020-12-20 NOTE — ED Triage Notes (Signed)
Pt presents with rash on abdomen that is painful X 1 week; pt states she has had shingles before and it feels like the same.

## 2020-12-20 NOTE — Telephone Encounter (Signed)
Assisted by interpreter # [phone], Roma [facility]; pt called with complaints of itchy rash on her belly that is similar to when she had shingles; she has a ttrobbing sensation, itching and soreness; her symptoms started "a week ago" recommendations made per nurse triage protocol; she verbalized understanding; the pt is seen by Dr Karle Plumber at [place]; there is no availability within the time frame per guidelines; pt encouraged to consider a [place] or being evaluation at Urgent Care; thept says she will go to Urgent Care; will route to office for notification.   Reason for Disposition . [1] Looks infected (spreading redness, pus) AND [2] no fever  Answer Assessment - Initial Assessment Questions 1. APPEARANCE of RASH: "Describe the rash."      Looks like a burn; no blisters 2. LOCATION: "Where is the rash located?"      abdomen 3. NUMBER: "How many spots are there?"       4. SIZE: "How big are the spots?" (Inches, centimeters or compare to size of a coin)      4-fingers in lenth 5. ONSET: "When did the rash start?"      *No Answer* 6. ITCHING: "Does the rash itch?" If Yes, ask: "How bad is the itch?"  (Scale 0-10; or none, mild, moderate, severe)     9 out of 10 7. PAIN: "Does the rash hurt?" If Yes, ask: "How bad is the pain?"  (Scale 0-10; or none, mild, moderate, severe)    - NONE (0): no pain    - MILD (1-3): doesn't interfere with normal activities     - MODERATE (4-7): interferes with normal activities or awakens from sleep     - SEVERE (8-10): excruciating pain, unable to do any normal activities    Throbbing rated 10 out of 10 8. OTHER SYMPTOMS: "Do you have any other symptoms?" (e.g., fever)     no 9. PREGNANCY: "Is there any chance you are pregnant?" "When was your last menstrual period?"    no  Protocols used: RASH OR REDNESS - LOCALIZED-A-AH

## 2020-12-20 NOTE — Discharge Instructions (Addendum)
Take Valtrex 3 times a day to treat shingles.  I have called in 4 doses of hydrocodone to help with pain.  This can make you sleepy do not drive or drink alcohol with it.  If you have any worsening symptoms please return for reevaluation.  Follow-up with your primary care provider within 1 week for reevaluation.

## 2020-12-26 ENCOUNTER — Other Ambulatory Visit: Payer: Self-pay | Admitting: Internal Medicine

## 2020-12-26 DIAGNOSIS — N3281 Overactive bladder: Secondary | ICD-10-CM

## 2020-12-26 NOTE — Telephone Encounter (Signed)
Requested Prescriptions  Pending Prescriptions Disp Refills  . MYRBETRIQ 50 MG TB24 tablet [Pharmacy Med Name: MYRBETRIQ ER 50 MG TABLET] 30 tablet 0    Sig: TOME UNA [place] LOS DIAS     Urology: Bladder Agents - mirabegron Failed - 12/26/2020  1:45 AM      Failed - Last BP in normal range    BP Readings from Last 1 Encounters:  12/20/20 (!) 152/94         Passed - Valid encounter within last 12 months    Recent Outpatient Visits          11 months ago Type 2 diabetes mellitus without complication, without long-term current use of insulin ([place])   [place], Deborah B, MD   1 year ago Acute pain of left knee   [place], Deborah B, MD   1 year ago Essential hypertension   [place], Deborah B, MD   2 years ago Osteoporosis with current pathological fracture with routine healing, unspecified osteoporosis type, subsequent encounter   [place], Michelle P, NP   2 years ago Prediabetes   [place], MD      Future Appointments            In 1 week Ladell Pier, MD [place]

## 2020-12-30 DIAGNOSIS — Z1231 Encounter for screening mammogram for malignant neoplasm of breast: Secondary | ICD-10-CM | POA: Diagnosis not present

## 2021-01-01 ENCOUNTER — Encounter: Payer: Self-pay | Admitting: Internal Medicine

## 2021-01-01 NOTE — Progress Notes (Signed)
I received mammogram report from [facility] mammography.  Mammogram done 12/30/2020 was normal.

## 2021-01-06 ENCOUNTER — Other Ambulatory Visit: Payer: Self-pay | Admitting: Internal Medicine

## 2021-01-06 NOTE — Telephone Encounter (Signed)
  Notes to clinic: Patient has appt tomorrow    Requested Prescriptions  Pending Prescriptions Disp Refills   metFORMIN (GLUCOPHAGE) 500 MG tablet [Pharmacy Med Name: METFORMIN HCL 500 MG TABLET] 45 tablet 0    Sig: TOME UNA [place] LOS DIAS      Endocrinology:  Diabetes - Biguanides Failed - 01/06/2021  9:31 AM      Failed - Cr in normal range and within 360 days    Creatinine  Date Value Ref Range Status  03/25/2018 0.72 0.44 - 1.00 mg/dL Final   Creat  Date Value Ref Range Status  10/05/2016 0.66 0.60 - 0.93 mg/dL Final    Comment:      For patients > or = 77 years of age: The upper reference limit for Creatinine is approximately 13% higher for people identified as African-American.      Creatinine, Ser  Date Value Ref Range Status  05/12/2019 0.63 0.57 - 1.00 mg/dL Final          Failed - HBA1C is between 0 and 7.9 and within 180 days    Hemoglobin A1C  Date Value Ref Range Status  01/15/2020 6.5 (A) 4.0 - 5.6 % Final   HbA1c POC (<> result, manual entry)  Date Value Ref Range Status  04/08/2018 5.4 4.0 - 5.6 % Final          Failed - eGFR in normal range and within 360 days    GFR, Est African American  Date Value Ref Range Status  10/05/2016 >89 >=60 mL/min Final   GFR, Est AFR Am  Date Value Ref Range Status  03/25/2018 >60 >60 mL/min Final    Comment:    (NOTE) The eGFR has been calculated using the CKD EPI equation. This calculation has not been validated in all clinical situations. eGFR's persistently <60 mL/min signify possible Chronic Kidney Disease.    GFR calc Af Amer  Date Value Ref Range Status  05/12/2019 101 >59 mL/min/1.73 Final   GFR, Est Non African American  Date Value Ref Range Status  10/05/2016 89 >=60 mL/min Final   GFR, Estimated  Date Value Ref Range Status  03/25/2018 >60 >60 mL/min Final   GFR calc non Af Amer  Date Value Ref Range Status  05/12/2019 88 >59 mL/min/1.73 Final          Failed - Valid  encounter within last 6 months    Recent Outpatient Visits           11 months ago Type 2 diabetes mellitus without complication, without long-term current use of insulin ([place])   [place] Ladell Pier, MD   1 year ago Acute pain of left knee   [place], Deborah B, MD   1 year ago Essential hypertension   [place], MD   2 years ago Osteoporosis with current pathological fracture with routine healing, unspecified osteoporosis type, subsequent encounter   [place], Milford Cage, NP   2 years ago Prediabetes   [place], [place], MD       Future Appointments             Tomorrow Ladell Pier, MD [place]

## 2021-01-07 ENCOUNTER — Encounter: Payer: Self-pay | Admitting: Internal Medicine

## 2021-01-07 ENCOUNTER — Ambulatory Visit: Payer: Medicare HMO | Attending: Internal Medicine | Admitting: Internal Medicine

## 2021-01-07 ENCOUNTER — Other Ambulatory Visit: Payer: Self-pay

## 2021-01-07 VITALS — BP 129/84 | HR 99 | Resp 16 | Wt 146.2 lb

## 2021-01-07 DIAGNOSIS — M810 Age-related osteoporosis without current pathological fracture: Secondary | ICD-10-CM | POA: Diagnosis not present

## 2021-01-07 DIAGNOSIS — L309 Dermatitis, unspecified: Secondary | ICD-10-CM

## 2021-01-07 DIAGNOSIS — N3281 Overactive bladder: Secondary | ICD-10-CM

## 2021-01-07 DIAGNOSIS — M1712 Unilateral primary osteoarthritis, left knee: Secondary | ICD-10-CM | POA: Diagnosis not present

## 2021-01-07 DIAGNOSIS — I10 Essential (primary) hypertension: Secondary | ICD-10-CM | POA: Diagnosis not present

## 2021-01-07 DIAGNOSIS — Z23 Encounter for immunization: Secondary | ICD-10-CM

## 2021-01-07 DIAGNOSIS — E119 Type 2 diabetes mellitus without complications: Secondary | ICD-10-CM | POA: Diagnosis not present

## 2021-01-07 DIAGNOSIS — Z8639 Personal history of other endocrine, nutritional and metabolic disease: Secondary | ICD-10-CM

## 2021-01-07 DIAGNOSIS — M79672 Pain in left foot: Secondary | ICD-10-CM

## 2021-01-07 DIAGNOSIS — E1140 Type 2 diabetes mellitus with diabetic neuropathy, unspecified: Secondary | ICD-10-CM

## 2021-01-07 DIAGNOSIS — E785 Hyperlipidemia, unspecified: Secondary | ICD-10-CM

## 2021-01-07 DIAGNOSIS — E1169 Type 2 diabetes mellitus with other specified complication: Secondary | ICD-10-CM

## 2021-01-07 LAB — POCT GLYCOSYLATED HEMOGLOBIN (HGB A1C): HbA1c, POC (controlled diabetic range): 5.8 % (ref 0.0–7.0)

## 2021-01-07 LAB — GLUCOSE, POCT (MANUAL RESULT ENTRY): POC Glucose: 120 mg/dl — AB (ref 70–99)

## 2021-01-07 MED ORDER — ATORVASTATIN CALCIUM 10 MG PO TABS: 10.0000 mg | ORAL_TABLET | Freq: Every day | ORAL | 2 refills | Status: DC

## 2021-01-07 MED ORDER — FERROUS SULFATE 325 (65 FE) MG PO TABS: 325.0000 mg | ORAL_TABLET | Freq: Every day | ORAL | 1 refills | Status: AC

## 2021-01-07 MED ORDER — HYDROCHLOROTHIAZIDE 12.5 MG PO TABS: ORAL_TABLET | ORAL | 1 refills | Status: DC

## 2021-01-07 MED ORDER — DICLOFENAC SODIUM 1 % EX GEL: 4.0000 g | Freq: Four times a day (QID) | CUTANEOUS | 6 refills | Status: DC

## 2021-01-07 MED ORDER — AMLODIPINE BESYLATE 5 MG PO TABS: 2.5000 mg | ORAL_TABLET | Freq: Every day | ORAL | 3 refills | Status: DC

## 2021-01-07 MED ORDER — METFORMIN HCL 500 MG PO TABS: 500.0000 mg | ORAL_TABLET | Freq: Every day | ORAL | 1 refills | Status: DC

## 2021-01-07 MED ORDER — ALENDRONATE SODIUM 70 MG PO TABS: ORAL_TABLET | ORAL | 1 refills | Status: DC

## 2021-01-07 MED ORDER — TRIAMCINOLONE ACETONIDE 0.1 % EX CREA: 1.0000 "application " | TOPICAL_CREAM | Freq: Two times a day (BID) | CUTANEOUS | 0 refills | Status: DC

## 2021-01-07 NOTE — Progress Notes (Signed)
Patient ID: Jodi [name], female    DOB: 1945-10-15  MRN: [id number]  CC: Diabetes, Hypertension, and Hospitalization Follow-up (ED)   Subjective: Jodi [name] is a 75 y.o. female who presents for chronbic ds management Her concerns today include: Pt  with hx of GERD, osteoporosis, Vit D def, OAB, diverticulosis, iron def, HTN, HL and DM.     Seen in ER earlier this mth with rash on RLQ abdomen thought to be herpes zoster.  Pt states the rash was itchy but not painful so she wounders whether it was truly zoster or something else. Given Valtrex which did not help.  Still very itchy. Never had vesicles.   DM:  Results for orders placed or performed in visit on 01/07/21  POCT glucose (manual entry)  Result Value Ref Range   POC Glucose 120 (A) 70 - 99 mg/dl  POCT glycosylated hemoglobin (Hb A1C)  Result Value Ref Range   Hemoglobin A1C     HbA1c POC (<> result, manual entry)     HbA1c, POC (prediabetic range)     HbA1c, POC (controlled diabetic range) 5.8 0.0 - 7.0 %   checks BS once a day in mornings.  Gives range 90-105.  Wants to know if she has to continue checking BS daily Compliant with metformin Does well with eating habits. Walks daily 30-40 mins.  Some pain in LT knee and LT mid foot when walking.  Some swelling in knee.  She has known osteoarthritis in the knee and has had steroid injections through orthopedics in the past.  Pain in foot x 3 yrs.  Saw podiatrist in past. She was using Naprosyn but was told she would need authorization from me to continue  Osteoporosis:  tolerating Fosamax once a wk.  Endorses taking Calcium and vitamin D.  Does not recall the dose of Calcium supplement that she is currently taking.  HTN:  compliant with HCTZ and Norvasc and salt restriction No device to check BP No CP/SOB/LE edema  HL:  taking and tolerating Lipitor.  Anemia: still takes iron daily.  She has had to have blood transfusions in the past.  OAB: on Mybetriq.   However she wonders whether some procedure can be done because she finds the incontinence of urine quite bothersome.  Even with the medication she has episodes where she can barely make it to the restroom.     Patient Active Problem List   Diagnosis Date Noted   Mild major depression, single episode ([place]) 01/15/2020   Overweight 01/15/2020   Overactive bladder 01/15/2020   Hyperlipidemia 05/13/2019   Age-related osteoporosis without current pathological fracture 03/08/2019   Chronic pain 01/01/2019   Medication refill 01/01/2019   Symptomatic anemia    Iron deficiency anemia due to chronic blood loss 10/14/2017   HTN (hypertension), benign 03/16/2017   Primary insomnia 03/16/2017   Osteoporosis 01/08/2017   GERD (gastroesophageal reflux disease) 01/08/2017   Vitamin D deficiency 01/08/2017   Edema 03/13/2014     Current Outpatient Medications on File Prior to Visit  Medication Sig Dispense Refill   ACCU-CHEK SOFTCLIX LANCETS lancets Use as instructed to test blood glucose twice daily. 100 each 12   Blood Glucose Monitoring Suppl (ACCU-CHEK AVIVA PLUS) w/Device KIT To test blood glucose twice daily. 1 kit 0   Blood Glucose Monitoring Suppl (ACCU-CHEK GUIDE ME) w/Device KIT Use as directed 1 kit 0   CALCIUM PO Take 1 tablet by mouth daily.     DEXILANT 60 MG  capsule TOME UNA CAPSULA TODOS LOS DIAS. 90 capsule 3   Elastic Bandages & Supports (LUMBAR BACK BRACE/SUPPORT PAD) MISC 1 application by Does not apply route daily. 1 each 0   glucose blood (ACCU-CHEK GUIDE) test strip Use as instructed 100 each 12   HYDROcodone-acetaminophen (NORCO/VICODIN) 5-325 MG tablet Take 1 tablet by mouth every 6 (six) hours as needed for up to 4 doses. 4 tablet 0   Iron-FA-B Cmp-C-Biot-Probiotic (FUSION PLUS) CAPS Take 1 tablet by mouth every other day. As tolerated 90 capsule 2   Lancet Devices (ACCU-CHEK SOFTCLIX) lancets Use as instructed to test blood glucose twice daily. 1 each 0   Lancets Misc.  (ACCU-CHEK FASTCLIX LANCET) KIT Use as directed 1 kit 6   methocarbamol (ROBAXIN) 500 MG tablet Take 1 tablet (500 mg total) by mouth 2 (two) times daily. 30 tablet 0   MYRBETRIQ 50 MG TB24 tablet TOME UNA TABLETA TODOS LOS DIAS 30 tablet 0   senna-docusate (SENOKOT-S) 8.6-50 MG tablet Take 1 tablet by mouth at bedtime as needed for mild constipation. 120 tablet 3   Vitamin D, Cholecalciferol, 10 MCG (400 UNIT) CHEW Chew 2 tablets (800 Units total) by mouth daily. 60 tablet 6   No current facility-administered medications on file prior to visit.    Allergies  Allergen Reactions   Cortisone     Rash, swelling   Gabapentin Nausea Only    With dizziness   Pork-Derived Products     Rash, joint pain   Shellfish Allergy Rash and Other (See Comments)    joint pain, GI pain    Social History   Socioeconomic History   Marital status: Single    Spouse name: Not on file   Number of children: Not on file   Years of education: Not on file   Highest education level: Not on file  Occupational History   Not on file  Tobacco Use   Smoking status: Never   Smokeless tobacco: Never  Vaping Use   Vaping Use: Never used  Substance and Sexual Activity   Alcohol use: No   Drug use: No   Sexual activity: Not Currently  Other Topics Concern   Not on file  Social History Narrative   Not on file   Social Determinants of Health   Financial Resource Strain: Not on file  Food Insecurity: Not on file  Transportation Needs: Not on file  Physical Activity: Not on file  Stress: Not on file  Social Connections: Not on file  Intimate Partner Violence: Not on file    Family History  Family history unknown: Yes    Past Surgical History:  Procedure Laterality Date   CESAREAN SECTION     SHOULDER SURGERY Left 2010    ROS: Review of Systems Negative except as stated above  PHYSICAL EXAM: BP 129/84   Pulse 99   Resp 16   Wt 146 lb 3.2 oz (66.3 kg)   SpO2 96%   BMI 28.55 kg/m   Wt  Readings from Last 3 Encounters:  01/07/21 146 lb 3.2 oz (66.3 kg)  03/06/20 152 lb (68.9 kg)  01/15/20 153 lb (69.4 kg)    Physical Exam  General appearance - alert, well appearing, and in no distress Mental status - normal mood, behavior, speech, dress, motor activity, and thought processes Neck - supple, no significant adenopathy Chest - clear to auscultation, no wheezes, rales or rhonchi, symmetric air entry Heart - normal rate, regular rhythm, normal S1, S2, no murmurs,  rubs, clicks or gallops Musculoskeletal -left knee: Moderate joint enlargement.  No point tenderness.  Moderate crepitus with passive range of motion.  Gait is normal. Left foot: No edema.  Good range of motion of the ankle in all directions. Extremities -no lower extremity edema. Skin: About 3 cm mild hypopigmented area in the right lower quadrant of the abdomen.  No scaling or scabs noted. Diabetic Foot Exam - Simple   Simple Foot Form Visual Inspection See comments: Yes Sensation Testing See comments: Yes Pulse Check Posterior Tibialis and Dorsalis pulse intact bilaterally: Yes Comments Patient has high arches.  Decree sensation on plantar surface of the left foot.       Depression screen [facility] 2/9 01/07/2021 01/15/2020 01/10/2019  Decreased Interest 1 2 0  Down, Depressed, Hopeless 3 - 0  PHQ - 2 Score 4 2 0  Altered sleeping _0 Tired, decreased energy _1 Change in appetite _2 Feeling bad or failure about yourself  0 0 0  Trouble concentrating 0 2 0  Moving slowly or fidgety/restless 0 2 0  Suicidal thoughts 0 0 0  PHQ-9 Score _3 Some recent data might be hidden    CMP Latest Ref Rng & Units 05/12/2019 03/25/2018 11/19/2017  Glucose 65 - 99 mg/dL 102(H) 108(H) 102  BUN 8 - 27 mg/dL _4 Creatinine 0.57 - 1.00 mg/dL 0.63 0.72 0.69  Sodium 134 - 144 mmol/L 139 142 140  Potassium 3.5 - 5.2 mmol/L 4.3 4.1 4.7  Chloride 96 - 106 mmol/L 104 107 109  CO2 20 - 29 mmol/L _5 Calcium 8.7 - 10.3 mg/dL 9.3 9.6 9.2  Total Protein 6.0 - 8.5 g/dL 7.0 7.5 7.6  Total Bilirubin 0.0 - 1.2 mg/dL 0.5 0.6 0.4  Alkaline Phos 39 - 117 IU/L 86 74 80  AST 0 - 40 IU/L _6 ALT 0 - 32 IU/L _7 Lipid Panel     Component Value Date/Time   CHOL 238 (H) 05/12/2019 0907   TRIG 152 (H) 05/12/2019 0907   HDL 63 05/12/2019 0907   CHOLHDL 3.8 05/12/2019 0907   CHOLHDL 2.8 04/02/2016 1042   VLDL 21 04/02/2016 1042   LDLCALC 148 (H) 05/12/2019 0907    CBC    Component Value Date/Time   WBC 5.6 05/12/2019 0907   WBC 4.4 03/25/2018 0912   WBC 5.1 10/15/2017 0820   RBC 3.82 05/12/2019 0907   RBC 4.16 03/25/2018 0912   RBC 4.15 03/25/2018 0912   HGB 11.3 05/12/2019 0907   HCT 34.5 05/12/2019 0907   PLT 419 05/12/2019 0907   MCV 90 05/12/2019 0907   MCH 29.6 05/12/2019 0907   MCH 30.6 03/25/2018 0912   MCHC 32.8 05/12/2019 0907   MCHC 32.6 03/25/2018 0912   RDW 13.9 05/12/2019 0907   LYMPHSABS 1.3 03/25/2018 0912   MONOABS 0.5 03/25/2018 0912   EOSABS 0.2 03/25/2018 0912   BASOSABS 0.1 03/25/2018 0912    ASSESSMENT AND PLAN:  1. Type 2 diabetes, controlled, with neuropathy ([place]) At goal.  Continue metformin, healthy eating habits and regular exercise. - POCT glucose (manual entry) - POCT glycosylated hemoglobin (Hb A1C) - Microalbumin / creatinine urine ratio - CBC - Comprehensive metabolic panel - Lipid panel - metFORMIN (GLUCOPHAGE) 500 MG tablet; Take 1 tablet (500 mg total) by mouth daily with breakfast.  Dispense: 90 tablet; Refill: 1  2.  Essential hypertension Close to goal.  Continue current medications and DASH diet. - hydrochlorothiazide (HYDRODIURIL) 12.5 MG tablet; TOME UNA TABLETA TODOS LOS DIAS  Dispense: 90 tablet; Refill: 1 - amLODipine (NORVASC) 5 MG tablet; Take 0.5 tablets (2.5 mg total) by mouth daily.  Dispense: 45 tablet; Refill: 3  3. Overactive bladder Advised patient that I can refer her to a urogynecologist to discuss  other options for her incontinence - Ambulatory referral to Urogynecology  4. Age-related osteoporosis without current pathological fracture It has been about 4 years since her last bone density.  We will repeat at this time to see if there has been any improvement on Fosamax. Advised patient that she should be taking calcium 600 mg twice a day and at least 800 mcg of vitamin D daily - DG Bone Density; Future - alendronate (FOSAMAX) 70 MG tablet; TAKE 1 TABLET (70 MG TOTAL) BY MOUTH ONCE A WEEK.  Dispense: 12 tablet; Refill: 1  5. Primary osteoarthritis of left knee - diclofenac Sodium (VOLTAREN) 1 % GEL; Apply 4 g topically 4 (four) times daily. Left knee.  Dispense: 100 g; Refill: 6  6. Hyperlipidemia associated with type 2 diabetes mellitus (HCC) - atorvastatin (LIPITOR) 10 MG tablet; Take 1 tablet (10 mg total) by mouth daily.  Dispense: 90 tablet; Refill: 2  7. History of iron deficiency - ferrous sulfate 325 (65 FE) MG tablet; Take 1 tablet (325 mg total) by mouth daily with breakfast.  Dispense: 90 tablet; Refill: 1  8. Left foot pain Recommend referral back to podiatry.  Patient states she will schedule on her own.  9. Dermatitis - triamcinolone cream (KENALOG) 0.1 %; Apply 1 application topically 2 (two) times daily.  Dispense: 15 g; Refill: 0  11. Need for shingles vaccine Recommend shingles vaccine.  Patient agreeable to receiving the first dose of Shingrix.    Patient was given the opportunity to ask questions.  Patient verbalized understanding of the plan and was able to repeat key elements of the plan.  [employer] Language interpreter used during this encounter. #[id number], Sharyn Lull  Orders Placed This Encounter  Procedures   DG Bone Density   Varicella-zoster vaccine IM (Shingrix)   Microalbumin / creatinine urine ratio   CBC   Comprehensive metabolic panel   Lipid panel   Ambulatory referral to Urogynecology   POCT glucose (manual entry)   POCT glycosylated hemoglobin  (Hb A1C)     Requested Prescriptions   Signed Prescriptions Disp Refills   triamcinolone cream (KENALOG) 0.1 % 15 g 0    Sig: Apply 1 application topically 2 (two) times daily.   diclofenac Sodium (VOLTAREN) 1 % GEL 100 g 6    Sig: Apply 4 g topically 4 (four) times daily. Left knee.   atorvastatin (LIPITOR) 10 MG tablet 90 tablet 2    Sig: Take 1 tablet (10 mg total) by mouth daily.   ferrous sulfate 325 (65 FE) MG tablet 90 tablet 1    Sig: Take 1 tablet (325 mg total) by mouth daily with breakfast.   hydrochlorothiazide (HYDRODIURIL) 12.5 MG tablet 90 tablet 1    Sig: TOME UNA TABLETA TODOS LOS DIAS   metFORMIN (GLUCOPHAGE) 500 MG tablet 90 tablet 1    Sig: Take 1 tablet (500 mg total) by mouth daily with breakfast.   amLODipine (NORVASC) 5 MG tablet 45 tablet 3    Sig: Take 0.5 tablets (2.5 mg total) by mouth daily.   alendronate (FOSAMAX) 70 MG tablet 12 tablet 1  Sig: TAKE 1 TABLET (70 MG TOTAL) BY MOUTH ONCE A WEEK.    Return in about 4 months (around 05/09/2021).  Karle Plumber, MD, FACP

## 2021-01-07 NOTE — Progress Notes (Signed)
Pt is requesting new glucometer and supplies

## 2021-01-08 LAB — COMPREHENSIVE METABOLIC PANEL
ALT: 15 IU/L (ref 0–32)
AST: 20 IU/L (ref 0–40)
Albumin/Globulin Ratio: 1.6 (ref 1.2–2.2)
Albumin: 4.5 g/dL (ref 3.7–4.7)
Alkaline Phosphatase: 95 IU/L (ref 44–121)
BUN/Creatinine Ratio: 29 — ABNORMAL HIGH (ref 12–28)
BUN: 16 mg/dL (ref 8–27)
Bilirubin Total: 0.3 mg/dL (ref 0.0–1.2)
CO2: 23 mmol/L (ref 20–29)
Calcium: 9.9 mg/dL (ref 8.7–10.3)
Chloride: 102 mmol/L (ref 96–106)
Creatinine, Ser: 0.56 mg/dL — ABNORMAL LOW (ref 0.57–1.00)
Globulin, Total: 2.9 g/dL (ref 1.5–4.5)
Glucose: 108 mg/dL — ABNORMAL HIGH (ref 65–99)
Potassium: 4.5 mmol/L (ref 3.5–5.2)
Sodium: 141 mmol/L (ref 134–144)
Total Protein: 7.4 g/dL (ref 6.0–8.5)
eGFR: 94 mL/min/{1.73_m2} (ref >59)

## 2021-01-08 LAB — CBC
Hematocrit: 38 % (ref 34.0–46.6)
Hemoglobin: 12.4 g/dL (ref 11.1–15.9)
MCH: 29.6 pg (ref 26.6–33.0)
MCHC: 32.6 g/dL (ref 31.5–35.7)
MCV: 91 fL (ref 79–97)
Platelets: 404 10*3/uL (ref 150–450)
RBC: 4.19 x10E6/uL (ref 3.77–5.28)
RDW: 13.4 % (ref 11.7–15.4)
WBC: 6.8 10*3/uL (ref 3.4–10.8)

## 2021-01-08 LAB — MICROALBUMIN / CREATININE URINE RATIO
Creatinine, Urine: 66.4 mg/dL
Microalb/Creat Ratio: 9 mg/g creat (ref 0–29)
Microalbumin, Urine: 6.1 ug/mL

## 2021-01-08 LAB — LIPID PANEL
Chol/HDL Ratio: 3.5 ratio (ref 0.0–4.4)
Cholesterol, Total: 229 mg/dL — ABNORMAL HIGH (ref 100–199)
HDL: 66 mg/dL (ref >39)
LDL Chol Calc (NIH): 131 mg/dL — ABNORMAL HIGH (ref 0–99)
Triglycerides: 181 mg/dL — ABNORMAL HIGH (ref 0–149)
VLDL Cholesterol Cal: 32 mg/dL (ref 5–40)

## 2021-01-08 NOTE — Progress Notes (Signed)
Let patient know that her blood cell count is normal meaning she is not anemic.  Kidney and liver function tests normal.  Cholesterol is 131 with goal being less than 70.  Please confirm that she has been taking the atorvastatin 10 mg consistently.  If she has, I recommend increasing the dose to 20 mg daily which means she will take 2 of the 10 mg until she is done with her current bottle.  Please let me know her answer so that I know whether to send an updated prescription to her pharmacy for the 20 mg tablets going forward.

## 2021-01-09 ENCOUNTER — Telehealth: Payer: Self-pay

## 2021-01-09 NOTE — Telephone Encounter (Signed)
Attempt made to contact Jodi [name] is a 75 y.o. female re: New Patient appointment with Dr. Wannetta Sender for Overactive bladder Pt was not available.  LM on the VM for the patient to call me back.

## 2021-01-16 ENCOUNTER — Other Ambulatory Visit: Payer: Self-pay

## 2021-01-16 ENCOUNTER — Ambulatory Visit
Admission: RE | Admit: 2021-01-16 | Discharge: 2021-01-16 | Disposition: A | Discharge status: Home / Self Care | Payer: Medicare HMO | Source: Ambulatory Visit | Attending: Internal Medicine | Admitting: Internal Medicine

## 2021-01-16 DIAGNOSIS — M810 Age-related osteoporosis without current pathological fracture: Secondary | ICD-10-CM

## 2021-01-16 DIAGNOSIS — M85852 Other specified disorders of bone density and structure, left thigh: Secondary | ICD-10-CM | POA: Diagnosis not present

## 2021-01-16 DIAGNOSIS — Z780 Asymptomatic menopausal state: Secondary | ICD-10-CM | POA: Diagnosis not present

## 2021-01-18 ENCOUNTER — Other Ambulatory Visit: Payer: Self-pay | Admitting: Internal Medicine

## 2021-01-18 DIAGNOSIS — M1712 Unilateral primary osteoarthritis, left knee: Secondary | ICD-10-CM

## 2021-01-18 DIAGNOSIS — M810 Age-related osteoporosis without current pathological fracture: Secondary | ICD-10-CM

## 2021-01-18 NOTE — Progress Notes (Signed)
Let pt know that bone density study shows her bones are still thin consistent with osteoporosis.  She should continue Fosamax once a wk, Vit D and Calcium supplement.  I would like for her to have a f/u visit with the endocrinologist Dr. Kelton Pillar whom she same several yrs ago for the osteoporosis.  Referral submitted.

## 2021-01-24 ENCOUNTER — Other Ambulatory Visit: Payer: Self-pay | Admitting: Internal Medicine

## 2021-01-24 DIAGNOSIS — N3281 Overactive bladder: Secondary | ICD-10-CM

## 2021-02-03 ENCOUNTER — Other Ambulatory Visit: Payer: Self-pay | Admitting: Internal Medicine

## 2021-02-03 DIAGNOSIS — K219 Gastro-esophageal reflux disease without esophagitis: Secondary | ICD-10-CM

## 2021-02-03 NOTE — Telephone Encounter (Signed)
  Notes to clinic:  Alternative Requested:DRUG NOT COVERED UNDER PLAN   Requested Prescriptions  Pending Prescriptions Disp Refills   omeprazole (PRILOSEC) 40 MG capsule [Pharmacy Med Name: OMEPRAZOLE DR 40 MG [place]  0      Gastroenterology: Proton Pump Inhibitors Passed - 02/03/2021 11:24 AM      Passed - Valid encounter within last 12 months    Recent Outpatient Visits           3 weeks ago Type 2 diabetes, controlled, with neuropathy ([place])   [place], Neoma Laming B, MD   1 year ago Type 2 diabetes mellitus without complication, without long-term current use of insulin ([place])   [place], Deborah B, MD   1 year ago Acute pain of left knee   [place], Deborah B, MD   2 years ago Essential hypertension   [place], Deborah B, MD   2 years ago Osteoporosis with current pathological fracture with routine healing, unspecified osteoporosis type, subsequent encounter   [place], Grayson, NP       Future Appointments             In 3 months Wynetta Emery, Dalbert Batman, MD [place]

## 2021-02-04 NOTE — Telephone Encounter (Signed)
Dr. Wynetta Emery on PAL. Will route to Dr. Margarita Rana who is covering for her. Pt has been on Dexilant, however, her insurance will no longer cover this. Pharmacy requests covered alternative, omeprazole. Will route to provider to make change if appropriate.

## 2021-02-05 ENCOUNTER — Ambulatory Visit (INDEPENDENT_AMBULATORY_CARE_PROVIDER_SITE_OTHER): Payer: Medicare HMO | Admitting: Internal Medicine

## 2021-02-05 ENCOUNTER — Encounter: Payer: Self-pay | Admitting: Internal Medicine

## 2021-02-05 ENCOUNTER — Other Ambulatory Visit: Payer: Self-pay

## 2021-02-05 VITALS — BP 122/84 | HR 97 | Ht 60.0 in | Wt 145.0 lb

## 2021-02-05 DIAGNOSIS — R2689 Other abnormalities of gait and mobility: Secondary | ICD-10-CM

## 2021-02-05 DIAGNOSIS — M810 Age-related osteoporosis without current pathological fracture: Secondary | ICD-10-CM

## 2021-02-05 DIAGNOSIS — H04203 Unspecified epiphora, bilateral lacrimal glands: Secondary | ICD-10-CM

## 2021-02-05 NOTE — Patient Instructions (Signed)
PLease take Claritin over the counter for swollen and watery eyes

## 2021-02-05 NOTE — Progress Notes (Signed)
Name: Jodi [name]  MRN/ DOB: [id number], 1946-01-29    Age/ Sex: 75 y.o., female     PCP: Ladell Pier, MD   Reason for Endocrinology Evaluation: Osteoporosis     Initial Endocrinology Clinic Visit: 03/06/2021    PATIENT IDENTIFIER: Jodi [name] is a 75 y.o., female with a past medical history of GERD, Osteoporosis and HTN. She has followed with [facility] since 03/06/2021 for consultative assistance with management of her osteoporosis.   HISTORICAL SUMMARY:    Pt was diagnosed with osteoporosis:2018 Menarche at age : 46 Menopausal at age : 60 Fracture Hx: no Hx of HRT: no FH of osteoporosis or hip fracture: No Prior Hx of anti-estrogenic therapy : no Prior Hx of anti-resorptive therapy : Fosamax in 2018   GERD well controlled on Dexilant   She was started on alendronate   SUBJECTIVE:     Today (02/05/2021):  Jodi [name] is here for follow-up on osteoporosis.  She has been compliant with alendronate intake  She denies heartburn with alendronate  No bone fractures since last visit  No recent falls   She is also c/o headaches, watery swollen eyes, hard to hear , no sleep , heavy legs and feet for the past 1 month .Marland Kitchen Has had imbalance issues for the past yr    Medication:  Alendronate 70 mg weekly Calcium citrate 600  mg BID Vitamin D 800 IU daily    HISTORY:  Past Medical History:  Past Medical History:  Diagnosis Date   Anemia    Diabetes mellitus without complication ([place])    Hypertension    Insomnia    Polio    in childhood   Past Surgical History:  Past Surgical History:  Procedure Laterality Date   CESAREAN SECTION     SHOULDER SURGERY Left 2010   Social History:  reports that she has never smoked. She has never used smokeless tobacco. She reports that she does not drink alcohol and does not use drugs. Family History:  Family History  Family history unknown: Yes     HOME  MEDICATIONS: Allergies as of 02/05/2021       Reactions   Cortisone    Rash, swelling   Gabapentin Nausea Only   With dizziness   Pork-derived Products    Rash, joint pain   Shellfish Allergy Rash, Other (See Comments)   joint pain, GI pain        Medication List        Accurate as of February 05, 2021  2:48 PM. If you have any questions, ask your nurse or doctor.          Accu-Chek Aviva Plus w/Device Kit To test blood glucose twice daily.   Accu-Chek Guide Me w/Device Kit Use as directed   Accu-Chek [employer] Kit Use as directed   Accu-Chek Guide test strip Generic drug: glucose blood Use as instructed   accu-chek softclix lancets Use as instructed to test blood glucose twice daily.   Accu-Chek Softclix Lancets lancets Use as instructed to test blood glucose twice daily.   alendronate 70 MG tablet Commonly known as: FOSAMAX TAKE 1 TABLET (70 MG TOTAL) BY MOUTH ONCE A WEEK.   amLODipine 5 MG tablet Commonly known as: NORVASC Take 0.5 tablets (2.5 mg total) by mouth daily.   atorvastatin 10 MG tablet Commonly known as: LIPITOR Take 1 tablet (10 mg total) by mouth daily.   CALCIUM PO Take 1 tablet by mouth daily.  Dexilant 60 MG capsule Generic drug: dexlansoprazole TOME UNA CAPSULA TODOS LOS DIAS.   diclofenac Sodium 1 % Gel Commonly known as: VOLTAREN Apply 4 g topically 4 (four) times daily. Left knee.   ferrous sulfate 325 (65 FE) MG tablet Take 1 tablet (325 mg total) by mouth daily with breakfast.   Fusion Plus Caps Take 1 tablet by mouth every other day. As tolerated   hydrochlorothiazide 12.5 MG tablet Commonly known as: HYDRODIURIL TOME UNA TABLETA TODOS LOS DIAS   HYDROcodone-acetaminophen 5-325 MG tablet Commonly known as: NORCO/VICODIN Take 1 tablet by mouth every 6 (six) hours as needed for up to 4 doses.   Lumbar Back Brace/Support Pad Misc 1 application by Does not apply route daily.   metFORMIN 500 MG  tablet Commonly known as: GLUCOPHAGE Take 1 tablet (500 mg total) by mouth daily with breakfast.   methocarbamol 500 MG tablet Commonly known as: ROBAXIN Take 1 tablet (500 mg total) by mouth 2 (two) times daily.   Myrbetriq 50 MG Tb24 tablet Generic drug: mirabegron ER TOME UNA TABLETA TODOS LOS DIAS   senna-docusate 8.6-50 MG tablet Commonly known as: Senokot-S Take 1 tablet by mouth at bedtime as needed for mild constipation.   triamcinolone cream 0.1 % Commonly known as: KENALOG Apply 1 application topically 2 (two) times daily.   Vitamin D (Cholecalciferol) 10 MCG (400 UNIT) Chew Chew 2 tablets (800 Units total) by mouth daily.          OBJECTIVE:   PHYSICAL EXAM: VS: BP 122/84   Pulse 97   Ht 5' (1.524 m)   Wt 145 lb (65.8 kg)   SpO2 99%   BMI 28.32 kg/m    EXAM: General: Pt appears well and is in NAD  Lungs: Clear with good BS bilat with no rales, rhonchi, or wheezes  Heart: Auscultation: RRR.  Abdomen: Normoactive bowel sounds, soft, nontender, without masses or organomegaly palpable  Extremities:  BL LE: No pretibial edema normal ROM and strength.  Mental Status: Judgment, insight: Intact Orientation: Oriented to time, place, and person Memory: Intact for recent and remote events Mood and affect: No depression, anxiety, or agitation     DATA REVIEWED: Results for KEAH, LAMBA (MRN [id number]) as of 02/05/2021 14:48  Ref. Range 01/07/2021 15:54  Sodium Latest Ref Range: 134 - 144 mmol/L 141  Potassium Latest Ref Range: 3.5 - 5.2 mmol/L 4.5  Chloride Latest Ref Range: 96 - 106 mmol/L 102  CO2 Latest Ref Range: 20 - 29 mmol/L 23  Glucose Latest Ref Range: 65 - 99 mg/dL 108 (H)  BUN Latest Ref Range: 8 - 27 mg/dL 16  Creatinine Latest Ref Range: 0.57 - 1.00 mg/dL 0.56 (L)  Calcium Latest Ref Range: 8.7 - 10.3 mg/dL 9.9  BUN/Creatinine Ratio Latest Ref Range: 12 - 28  29 (H)  eGFR Latest Ref Range: >59 mL/min/1.73 94  Alkaline Phosphatase  Latest Ref Range: 44 - 121 IU/L 95  Albumin Latest Ref Range: 3.7 - 4.7 g/dL 4.5  Albumin/Globulin Ratio Latest Ref Range: 1.2 - 2.2  1.6  AST Latest Ref Range: 0 - 40 IU/L 20  ALT Latest Ref Range: 0 - 32 IU/L 15  Total Protein Latest Ref Range: 6.0 - 8.5 g/dL 7.4  Total Bilirubin Latest Ref Range: 0.0 - 1.2 mg/dL 0.3     DXA 01/16/2021 at [facility]  LEFT FEMUR neck   Bone Mineral Density (BMD):  0.586 g/cm2   Young Adult T-Score: -2.4   Z-Score:  -  0.3   LEFT FOREARM (1/3 RADIUS)   Bone Mineral Density (BMD):  0.527   Young Adult T Score:  -2.8   Z Score:  0.0   Unit: This study was performed at [facility] on the [place] (S/N [phone]), software version 13.4.2.   Scan quality: The scan quality is good. Exclusions: Lumbar spine is excluded because of degenerative changes.   ASSESSMENT: Patient's diagnostic category is OSTEOPOROSIS by WHO Criteria.   FRACTURE RISK: INCREASED   FRAX: World Health Organization FRAX assessment of absolute fracture risk is not calculated for this patient because the patient has T-score at or below -2.5.   COMPARISON: None.    ASSESSMENT / PLAN / RECOMMENDATIONS:   Osteoporosis  -The patient had a recent bone density but unfortunately this is impossible to compare to the previous bone density from 2018 due to change in location. -Even though her T score readings appear worse than 2018 but I explained to the patient that we are unable to say for certain that her bone density has worsened -I have recommended continuing with alendronate at this time since she is not having any side effects to it, I have emphasized the importance of taking calcium and vitamin D -We discussed repeating the bone density at the original location next year which she is in agreement of   Medications  Alendronate 70 mg weekly Calcium citrate 600  mg BID Vitamin D 800 IU daily  2.  Watery/swollen eyes:   -I discussed with the  patient this could be related to environmental allergies, I have advised her to try OTC medication  3.  Arthralgias:  -Patient under the impression this is related to osteoporosis but explained to her that she has osteoarthritis and she may have to see a specialist for this in the meantime she may use OTC Tylenol as needed -In the meantime I will check vitamin D  4.  Imbalance:  -Per patient this has been going on for over a year, I will check B12 as well as TFTs, I explained to the patient if these have these come back normal she will have to follow-up with her PCP for neurological work-up that is beyond the scope of endocrinology    Follow-up in 1 year Patient to stop by labs today at the [facility] location as we do not have a phlebotomist    Signed electronically by: Mack Guise, MD  [facility]  [address] [place], [place] [address] Phone: [phone] FAX: [phone]      CC: Ladell Pier, [place] [address] Phone: [phone]  Fax: [phone]   Return to Endocrinology clinic as below: Future Appointments  Date Time Provider [place]  02/05/2021  3:20 PM Jaydan Meidinger, Melanie Crazier, MD [facility] None  05/09/2021  3:50 PM Ladell Pier, MD [facility] None

## 2021-02-06 MED ORDER — ALENDRONATE SODIUM 70 MG PO TABS: 70.0000 mg | ORAL_TABLET | ORAL | 3 refills | Status: DC

## 2021-02-13 ENCOUNTER — Other Ambulatory Visit (INDEPENDENT_AMBULATORY_CARE_PROVIDER_SITE_OTHER): Payer: Medicare HMO

## 2021-02-13 DIAGNOSIS — M810 Age-related osteoporosis without current pathological fracture: Secondary | ICD-10-CM | POA: Diagnosis not present

## 2021-02-13 DIAGNOSIS — R2689 Other abnormalities of gait and mobility: Secondary | ICD-10-CM

## 2021-02-13 DIAGNOSIS — H04203 Unspecified epiphora, bilateral lacrimal glands: Secondary | ICD-10-CM | POA: Diagnosis not present

## 2021-02-13 LAB — TSH: TSH: 1.27 u[IU]/mL (ref 0.35–5.50)

## 2021-02-13 LAB — VITAMIN D 25 HYDROXY (VIT D DEFICIENCY, FRACTURES): VITD: 37.71 ng/mL (ref 30.00–100.00)

## 2021-02-13 LAB — T4, FREE: Free T4: 0.89 ng/dL (ref 0.60–1.60)

## 2021-02-13 LAB — VITAMIN B12: Vitamin B-12: 998 pg/mL — ABNORMAL HIGH (ref 211–911)

## 2021-02-17 ENCOUNTER — Encounter: Payer: Self-pay | Admitting: Internal Medicine

## 2021-03-23 ENCOUNTER — Telehealth: Payer: Self-pay

## 2021-03-23 NOTE — Telephone Encounter (Signed)
Called pt to schedule AWV, no answer, LVMTRC. 

## 2021-04-11 ENCOUNTER — Ambulatory Visit (HOSPITAL_BASED_OUTPATIENT_CLINIC_OR_DEPARTMENT_OTHER): Payer: Medicare HMO

## 2021-04-11 DIAGNOSIS — Z0000 Encounter for general adult medical examination without abnormal findings: Secondary | ICD-10-CM | POA: Diagnosis not present

## 2021-04-11 NOTE — Progress Notes (Signed)
Subjective:   Jodi [name] is a 75 y.o. female who presents for an Initial Medicare Annual Wellness Visit.I connected with  Angle Dirusso Marti-Torne on 04/11/21 by a audio enabled telemedicine application and verified that I am speaking with the correct person using two identifiers.   I discussed the limitations of evaluation and management by telemedicine. The patient expressed understanding and agreed to proceed.   Location of patient: Home Location of provider: Office  Persons participating in visit: Jodi [name] (patient) and Drenda Freeze, [place]  Review of Systems    Defer to PCP       Objective:    Today's Vitals   04/11/21 1407  PainSc: 7    There is no height or weight on file to calculate BMI.  Advanced Directives 04/11/2021 03/06/2020 01/15/2020 11/17/2019 11/19/2017 10/14/2017 10/14/2017  Does Patient Have a Medical Advance Directive? _0  No No  Would patient like information on creating a medical advance directive? - No - Patient declined - No - Patient declined No - Patient declined No - Patient declined No - Patient declined    Current Medications (verified) Outpatient Encounter Medications as of 04/11/2021  Medication Sig   ACCU-CHEK SOFTCLIX LANCETS lancets Use as instructed to test blood glucose twice daily.   alendronate (FOSAMAX) 70 MG tablet Take 1 tablet (70 mg total) by mouth once a week. TAKE 1 TABLET (70 MG TOTAL) BY MOUTH ONCE A WEEK.   amLODipine (NORVASC) 5 MG tablet Take 0.5 tablets (2.5 mg total) by mouth daily.   atorvastatin (LIPITOR) 10 MG tablet Take 1 tablet (10 mg total) by mouth daily.   Blood Glucose Monitoring Suppl (ACCU-CHEK AVIVA PLUS) w/Device KIT To test blood glucose twice daily.   Blood Glucose Monitoring Suppl (ACCU-CHEK GUIDE ME) w/Device KIT Use as directed   CALCIUM PO Take 1 tablet by mouth daily.   diclofenac Sodium (VOLTAREN) 1 % GEL Apply 4 g topically 4 (four) times daily. Left knee.   Elastic Bandages &  Supports (LUMBAR BACK BRACE/SUPPORT PAD) MISC 1 application by Does not apply route daily.   ferrous sulfate 325 (65 FE) MG tablet Take 1 tablet (325 mg total) by mouth daily with breakfast.   glucose blood (ACCU-CHEK GUIDE) test strip Use as instructed   hydrochlorothiazide (HYDRODIURIL) 12.5 MG tablet TOME UNA TABLETA TODOS LOS DIAS   Iron-FA-B Cmp-C-Biot-Probiotic (FUSION PLUS) CAPS Take 1 tablet by mouth every other day. As tolerated   Lancet Devices (ACCU-CHEK SOFTCLIX) lancets Use as instructed to test blood glucose twice daily.   Lancets Misc. (ACCU-CHEK FASTCLIX LANCET) KIT Use as directed   metFORMIN (GLUCOPHAGE) 500 MG tablet Take 1 tablet (500 mg total) by mouth daily with breakfast.   MYRBETRIQ 50 MG TB24 tablet TOME UNA TABLETA TODOS LOS DIAS   omeprazole (PRILOSEC) 40 MG capsule 1 tab p.o. daily   triamcinolone cream (KENALOG) 0.1 % Apply 1 application topically 2 (two) times daily.   Vitamin D, Cholecalciferol, 10 MCG (400 UNIT) CHEW Chew 2 tablets (800 Units total) by mouth daily.   methocarbamol (ROBAXIN) 500 MG tablet Take 1 tablet (500 mg total) by mouth 2 (two) times daily. (Patient not taking: Reported on 04/11/2021)   senna-docusate (SENOKOT-S) 8.6-50 MG tablet Take 1 tablet by mouth at bedtime as needed for mild constipation. (Patient not taking: Reported on 04/11/2021)   [DISCONTINUED] HYDROcodone-acetaminophen (NORCO/VICODIN) 5-325 MG tablet Take 1 tablet by mouth every 6 (six) hours as needed for up to 4 doses.  No facility-administered encounter medications on file as of 04/11/2021.    Allergies (verified) Cortisone, Gabapentin, Pork-derived products, and Shellfish allergy   History: Past Medical History:  Diagnosis Date   Anemia    Diabetes mellitus without complication ([place])    Hypertension    Insomnia    Polio    in childhood   Past Surgical History:  Procedure Laterality Date   CESAREAN SECTION     SHOULDER SURGERY Left 2010   Family History  Family  history unknown: Yes   Social History   Socioeconomic History   Marital status: Single    Spouse name: Not on file   Number of children: Not on file   Years of education: Not on file   Highest education level: Not on file  Occupational History   Not on file  Tobacco Use   Smoking status: Never   Smokeless tobacco: Never  Vaping Use   Vaping Use: Never used  Substance and Sexual Activity   Alcohol use: No   Drug use: No   Sexual activity: Not Currently  Other Topics Concern   Not on file  Social History Narrative   Not on file   Social Determinants of Health   Financial Resource Strain: Low Risk    Difficulty of Paying Living Expenses: Not hard at all  Food Insecurity: No Food Insecurity   Worried About Charity fundraiser in the Last Year: Never true   [place] in the Last Year: Never true  Transportation Needs: No Transportation Needs   Lack of Transportation (Medical): No   Lack of Transportation (Non-Medical): No  Physical Activity: Sufficiently Active   Days of Exercise per Week: 7 days   Minutes of Exercise per Session: 40 min  Stress: Stress Concern Present   Feeling of Stress : Rather much  Social Connections: Moderately Isolated   Frequency of Communication with Friends and Family: More than three times a week   Frequency of Social Gatherings with Friends and Family: More than three times a week   Attends Religious Services: More than 4 times per year   Active Member of [employer] or Organizations: No   Attends Music therapist: Never   Marital Status: Divorced    Tobacco Counseling Counseling given: Not Answered   Clinical Intake:  Pre-visit preparation completed: Yes  Pain : 0-10 Pain Score: 7  Pain Location: Leg Pain Onset: More than a month ago     Diabetes: No  How often do you need to have someone help you when you read instructions, pamphlets, or other written materials from your doctor or pharmacy?: 1 - Never What is  the last grade level you completed in school?: University  Diabetic?No  Interpreter Needed?: No  Information entered by :: Drenda Freeze, [place] of Daily Living In your present state of health, do you have any difficulty performing the following activities: 04/11/2021  Hearing? N  Vision? N  Difficulty concentrating or making decisions? N  Walking or climbing stairs? Y  Dressing or bathing? N  Doing errands, shopping? N  Preparing Food and eating ? N  Using the Toilet? N  In the past six months, have you accidently leaked urine? Y  Do you have problems with loss of bowel control? N  Managing your Medications? N  Managing your Finances? N  Housekeeping or managing your Housekeeping? N  Some recent data might be hidden    Patient Care Team: Ladell Pier,  MD as PCP - General (Internal Medicine) Ladell Pier, MD (Internal Medicine)  Indicate any recent Medical Services you may have received from other than [facility] providers in the past year (date may be approximate).     Assessment:   This is a routine wellness examination for [facility].  Hearing/Vision screen No results found.  Dietary issues and exercise activities discussed:     Goals Addressed   None    Depression Screen PHQ 2/9 Scores 04/11/2021 01/07/2021 01/15/2020 01/10/2019 08/29/2018 04/08/2018 11/24/2017  PHQ - 2 Score _0 0 _1 PHQ- 9 Score - _2 Fall Risk Fall Risk  04/11/2021 01/07/2021 01/10/2019 04/08/2018 03/16/2017  Falls in the past year? 1 0 0 No Yes  Number falls in past yr: 1 0 - - 1  Injury with Fall? 0 0 - - No  Risk for fall due to : - No Fall Risks - - -    FALL RISK PREVENTION PERTAINING TO THE HOME:  Any stairs in or around the home? No  If so, are there any without handrails?  N/A Home free of loose throw rugs in walkways, pet beds, electrical cords, etc? Yes  Adequate lighting in your home to reduce risk of falls? Yes   ASSISTIVE DEVICES UTILIZED TO  PREVENT FALLS:  Life alert? No  Use of a cane, walker or w/c? No  Grab bars in the bathroom? No  Shower chair or bench in shower? No  Elevated toilet seat or a handicapped toilet? No   TIMED UP AND GO:  Was the test performed?  N/A .  Length of time to ambulate 10 feet: N/A sec.     Cognitive Function:     6CIT Screen 04/11/2021  What Year? 0 points  What month? 0 points  What time? 0 points  Count back from 20 0 points  Months in reverse 0 points  Repeat phrase 0 points  Total Score 0    Immunizations Immunization History  Administered Date(s) Administered   Influenza,inj,Quad PF,6+ Mos 04/02/2016, 03/16/2017, 04/08/2018   PFIZER(Purple Top)SARS-COV-2 Vaccination 09/09/2019, 09/13/2019, 10/02/2019   Pneumococcal Conjugate-13 09/28/2017   Pneumococcal Polysaccharide-23 04/02/2016   Tdap 04/02/2016   Zoster Recombinat (Shingrix) 01/07/2021    TDAP status: Up to date  Flu Vaccine status: Due, Education has been provided regarding the importance of this vaccine. Advised may receive this vaccine at local pharmacy or Health Dept. Aware to provide a copy of the vaccination record if obtained from local pharmacy or Health Dept. Verbalized acceptance and understanding.  Covid-19 vaccine status: Completed vaccines  Qualifies for Shingles Vaccine? Yes   Zostavax completed Yes   Shingrix Completed?: Yes  Screening Tests Health Maintenance  Topic Date Due   FOOT EXAM  04/02/2017   OPHTHALMOLOGY EXAM  05/12/2017   COVID-19 Vaccine (4 - Booster) 12/25/2019   INFLUENZA VACCINE  02/17/2021   Zoster Vaccines- Shingrix (2 of 2) 03/04/2021   HEMOGLOBIN A1C  07/09/2021   URINE MICROALBUMIN  01/07/2022   TETANUS/TDAP  04/02/2026   DEXA SCAN  Completed   Hepatitis C Screening  Completed   HPV VACCINES  Aged Out    Health Maintenance  Health Maintenance Due  Topic Date Due   FOOT EXAM  04/02/2017   OPHTHALMOLOGY EXAM  05/12/2017   COVID-19 Vaccine (4 - Booster)  12/25/2019   INFLUENZA VACCINE  02/17/2021   Zoster Vaccines- Shingrix (2 of 2) 03/04/2021  Colorectal cancer screening: No longer required.   Mammogram status: No longer required due to age.  Lung Cancer Screening: (Low Dose CT Chest recommended if Age 57-80 years, 30 pack-year currently smoking OR have quit w/in 15years.) does not qualify.   Lung Cancer Screening Referral: No  Additional Screening:  Hepatitis C Screening: does qualify; Completed 04/02/2016.  Vision Screening: Recommended annual ophthalmology exams for early detection of glaucoma and other disorders of the eye. Is the patient up to date with their annual eye exam?  Yes  Who is the provider or what is the name of the office in which the patient attends annual eye exams? N/A If pt is not established with a provider, would they like to be referred to a provider to establish care? No .   Dental Screening: Recommended annual dental exams for proper oral hygiene  Community Resource Referral / Chronic Care Management: CRR required this visit?  No   CCM required this visit?  No      Plan:     I have personally reviewed and noted the following in the patient's chart:   Medical and social history Use of alcohol, tobacco or illicit drugs  Current medications and supplements including opioid prescriptions. Patient is not currently taking opioid prescriptions. Functional ability and status Nutritional status Physical activity Advanced directives List of other physicians Hospitalizations, surgeries, and ER visits in previous 12 months Vitals Screenings to include cognitive, depression, and falls Referrals and appointments  In addition, I have reviewed and discussed with patient certain preventive protocols, quality metrics, and best practice recommendations. A written personalized care plan for preventive services as well as general preventive health recommendations were provided to patient.     Drenda Freeze, [facility]   04/11/2021   Nurse Notes: Non-Face to Face 60 minute visit Encounter.  Ms. [place] , Thank you for taking time to come for your Medicare Wellness Visit. I appreciate your ongoing commitment to your health goals. Please review the following plan we discussed and let me know if I can assist you in the future.   These are the goals we discussed:  Goals   None     This is a list of the screening recommended for you and due dates:  Health Maintenance  Topic Date Due   Complete foot exam   04/02/2017   Eye exam for diabetics  05/12/2017   COVID-19 Vaccine (4 - Booster) 12/25/2019   Flu Shot  02/17/2021   Zoster (Shingles) Vaccine (2 of 2) 03/04/2021   Hemoglobin A1C  07/09/2021   Urine Protein Check  01/07/2022   Tetanus Vaccine  04/02/2026   DEXA scan (bone density measurement)  Completed   Hepatitis C Screening: USPSTF Recommendation to screen - Ages 34-79 yo.  Completed   HPV Vaccine  Aged Out

## 2021-04-11 NOTE — Patient Instructions (Signed)
Health Maintenance, Female Adopting a healthy lifestyle and getting preventive care are important in promoting health and wellness. Ask your health care provider about: The right schedule for you to have regular tests and exams. Things you can do on your own to prevent diseases and keep yourself healthy. What should I know about diet, weight, and exercise? Eat a healthy diet  Eat a diet that includes plenty of vegetables, fruits, low-fat dairy products, and lean protein. Do not eat a lot of foods that are high in solid fats, added sugars, or sodium. Maintain a healthy weight Body mass index (BMI) is used to identify weight problems. It estimates body fat based on height and weight. Your health care provider can help determine your BMI and help you achieve or maintain a healthy weight. Get regular exercise Get regular exercise. This is one of the most important things you can do for your health. Most adults should: Exercise for at least 150 minutes each week. The exercise should increase your heart rate and make you sweat (moderate-intensity exercise). Do strengthening exercises at least twice a week. This is in addition to the moderate-intensity exercise. Spend less time sitting. Even light physical activity can be beneficial. Watch cholesterol and blood lipids Have your blood tested for lipids and cholesterol at 77 years of age, then have this test every 5 years. Have your cholesterol levels checked more often if: Your lipid or cholesterol levels are high. You are older than 77 years of age. You are at high risk for heart disease. What should I know about cancer screening? Depending on your health history and family history, you may need to have cancer screening at various ages. This may include screening for: Breast cancer. Cervical cancer. Colorectal cancer. Skin cancer. Lung cancer. What should I know about heart disease, diabetes, and high blood pressure? Blood pressure and heart  disease High blood pressure causes heart disease and increases the risk of stroke. This is more likely to develop in people who have high blood pressure readings, are of African descent, or are overweight. Have your blood pressure checked: Every 3-5 years if you are 18-39 years of age. Every year if you are 40 years old or older. Diabetes Have regular diabetes screenings. This checks your fasting blood sugar level. Have the screening done: Once every three years after age 40 if you are at a normal weight and have a low risk for diabetes. More often and at a younger age if you are overweight or have a high risk for diabetes. What should I know about preventing infection? Hepatitis B If you have a higher risk for hepatitis B, you should be screened for this virus. Talk with your health care provider to find out if you are at risk for hepatitis B infection. Hepatitis C Testing is recommended for: Everyone born from 1945 through 1965. Anyone with known risk factors for hepatitis C. Sexually transmitted infections (STIs) Get screened for STIs, including gonorrhea and chlamydia, if: You are sexually active and are younger than 77 years of age. You are older than 77 years of age and your health care provider tells you that you are at risk for this type of infection. Your sexual activity has changed since you were last screened, and you are at increased risk for chlamydia or gonorrhea. Ask your health care provider if you are at risk. Ask your health care provider about whether you are at high risk for HIV. Your health care provider may recommend a prescription medicine   to help prevent HIV infection. If you choose to take medicine to prevent HIV, you should first get tested for HIV. You should then be tested every 3 months for as long as you are taking the medicine. Pregnancy If you are about to stop having your period (premenopausal) and you may become pregnant, seek counseling before you get  pregnant. Take 400 to 800 micrograms (mcg) of folic acid every day if you become pregnant. Ask for birth control (contraception) if you want to prevent pregnancy. Osteoporosis and menopause Osteoporosis is a disease in which the bones lose minerals and strength with aging. This can result in bone fractures. If you are 65 years old or older, or if you are at risk for osteoporosis and fractures, ask your health care provider if you should: Be screened for bone loss. Take a calcium or vitamin D supplement to lower your risk of fractures. Be given hormone replacement therapy (HRT) to treat symptoms of menopause. Follow these instructions at home: Lifestyle Do not use any products that contain nicotine or tobacco, such as cigarettes, e-cigarettes, and chewing tobacco. If you need help quitting, ask your health care provider. Do not use street drugs. Do not share needles. Ask your health care provider for help if you need support or information about quitting drugs. Alcohol use Do not drink alcohol if: Your health care provider tells you not to drink. You are pregnant, may be pregnant, or are planning to become pregnant. If you drink alcohol: Limit how much you use to 0-1 drink a day. Limit intake if you are breastfeeding. Be aware of how much alcohol is in your drink. In the [place] one drink equals one 12 oz bottle of beer (355 mL), one 5 oz glass of wine (148 mL), or one 1 oz glass of hard liquor (44 mL). General instructions Schedule regular health, dental, and eye exams. Stay current with your vaccines. Tell your health care provider if: You often feel depressed. You have ever been abused or do not feel safe at home. Summary Adopting a healthy lifestyle and getting preventive care are important in promoting health and wellness. Follow your health care provider's instructions about healthy diet, exercising, and getting tested or screened for diseases. Follow your health care provider's  instructions on monitoring your cholesterol and blood pressure. This information is not intended to replace advice given to you by your health care provider. Make sure you discuss any questions you have with your health care provider. Document Revised: 09/13/2020 Document Reviewed: 06/29/2018 Elsevier Patient Education  2022 [employer]  

## 2021-05-06 ENCOUNTER — Other Ambulatory Visit: Payer: Self-pay | Admitting: Family Medicine

## 2021-05-06 DIAGNOSIS — K219 Gastro-esophageal reflux disease without esophagitis: Secondary | ICD-10-CM

## 2021-05-06 IMAGING — DX DG ABDOMEN 2V
2 series · 2 of 2 positions shown · non-contrast
Comparison: 03/26/2005

CLINICAL DATA: Lower abdominal and back pain for 1 week, left lower
quadrant pain

EXAM:
ABDOMEN - 2 VIEW

[abdomen erect]
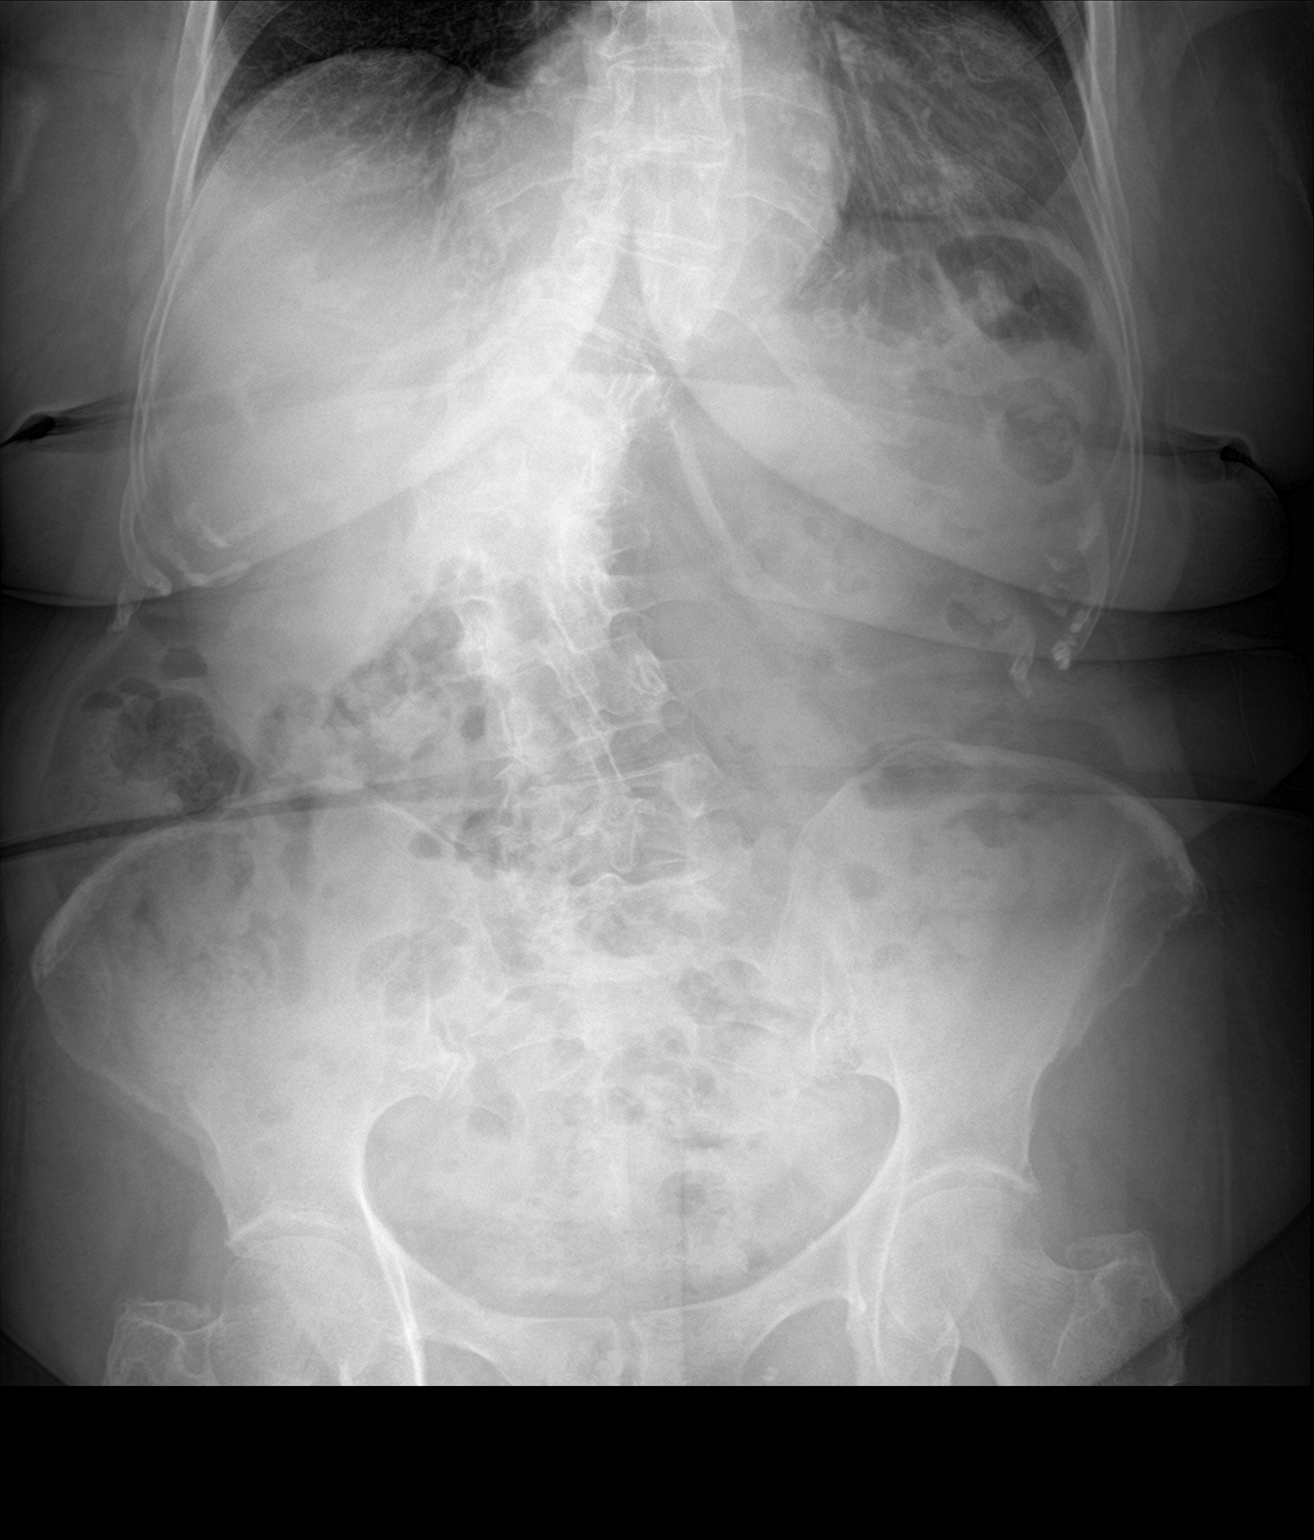

[abdomen supine]
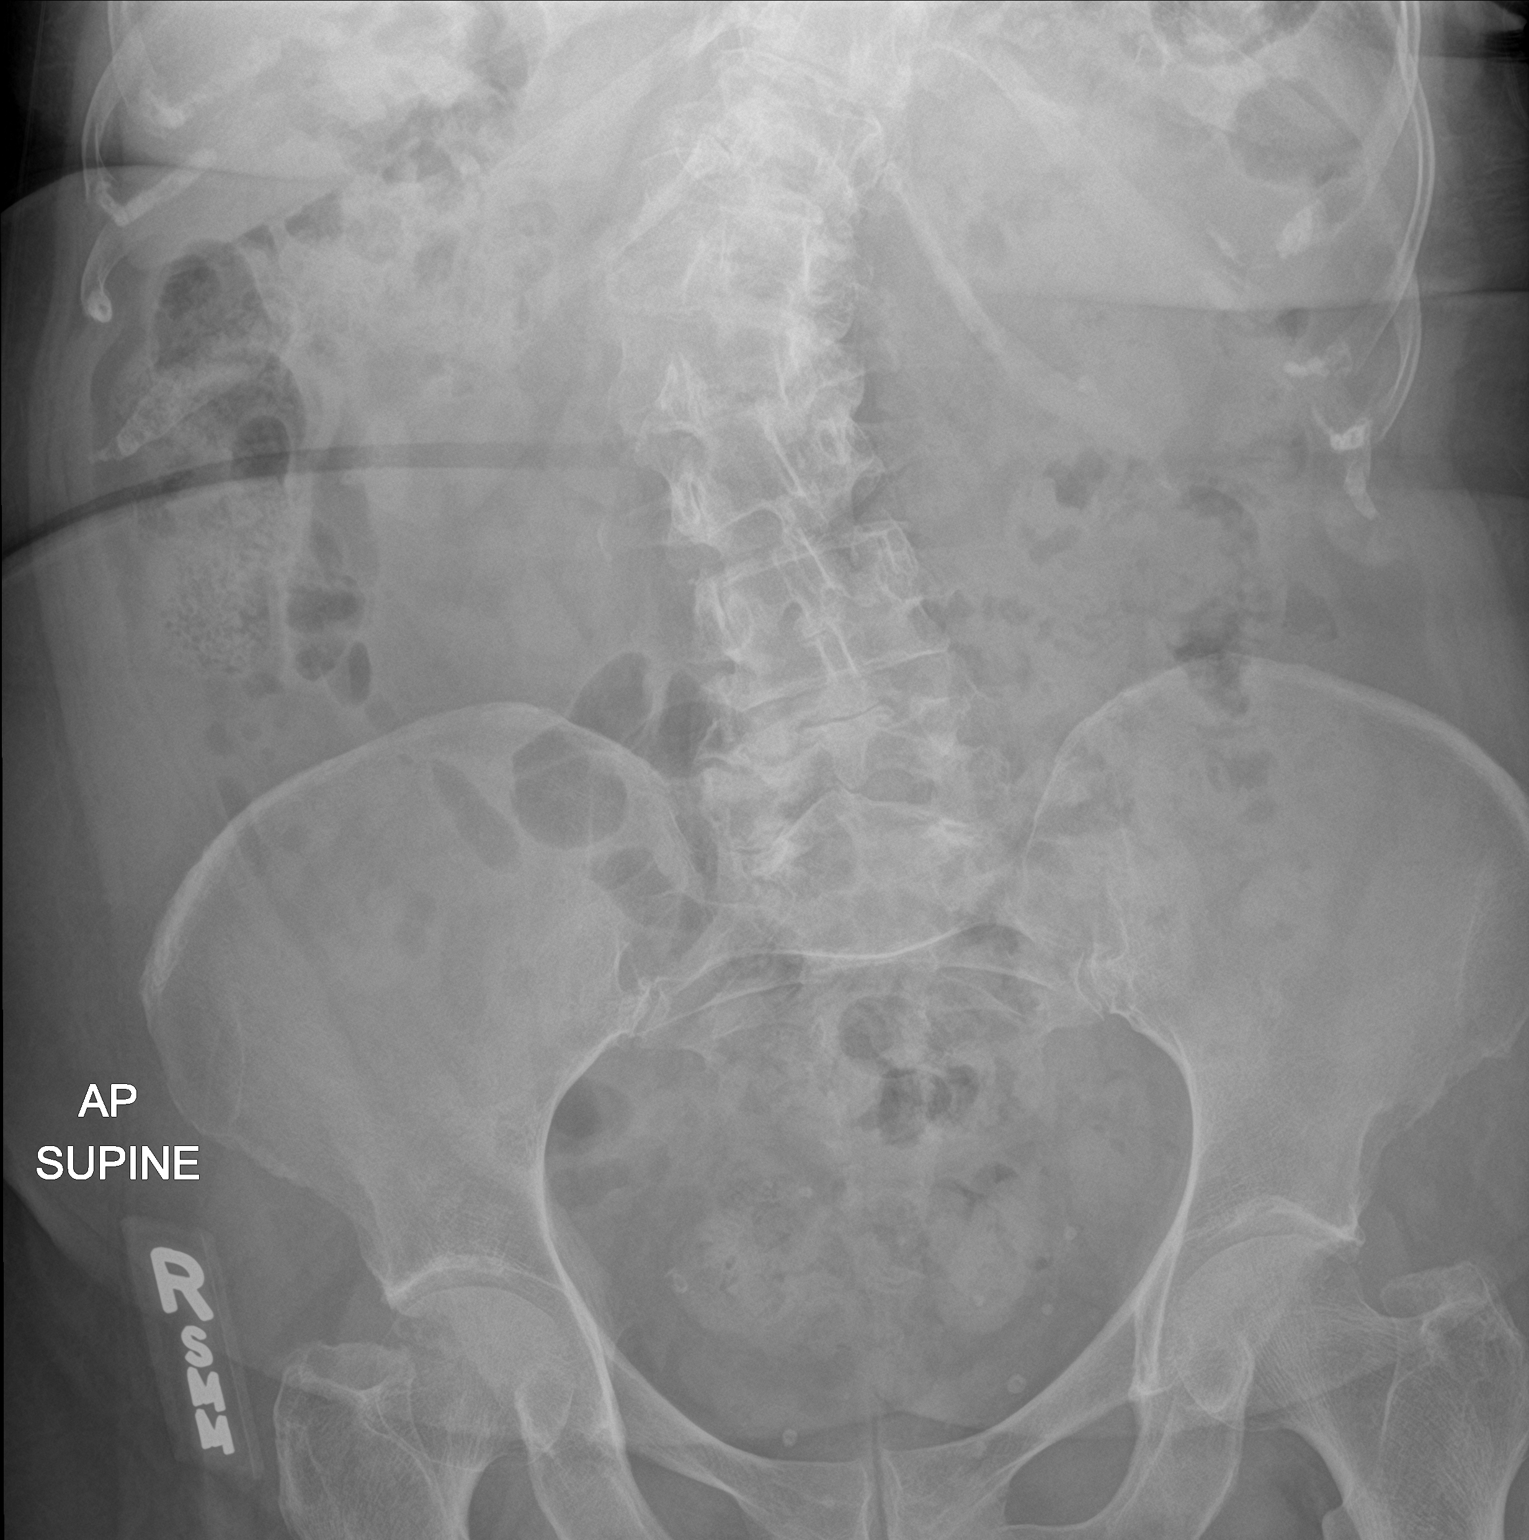

[2 of 2 positions shown; findings below may reference images not displayed]

FINDINGS: Supine and upright frontal views of the abdomen and pelvis
demonstrate an unremarkable bowel gas pattern. There are no masses
or abnormal calcifications. Large hiatal hernia is identified. There
is right convex scoliosis of the spine centered at the thoracolumbar
junction. No acute bony abnormalities.
IMPRESSION: 1. Unremarkable bowel gas pattern.
2. Hiatal hernia.
3. Marked right convex scoliosis centered at the thoracolumbar
junction.

## 2021-05-07 ENCOUNTER — Ambulatory Visit (INDEPENDENT_AMBULATORY_CARE_PROVIDER_SITE_OTHER): Payer: Medicare HMO | Admitting: Obstetrics and Gynecology

## 2021-05-07 ENCOUNTER — Encounter: Payer: Self-pay | Admitting: Obstetrics and Gynecology

## 2021-05-07 ENCOUNTER — Other Ambulatory Visit: Payer: Self-pay

## 2021-05-07 VITALS — BP 155/89 | HR 79 | Ht 60.0 in | Wt 143.0 lb

## 2021-05-07 DIAGNOSIS — N3281 Overactive bladder: Secondary | ICD-10-CM | POA: Diagnosis not present

## 2021-05-07 DIAGNOSIS — N393 Stress incontinence (female) (male): Secondary | ICD-10-CM | POA: Diagnosis not present

## 2021-05-07 DIAGNOSIS — R350 Frequency of micturition: Secondary | ICD-10-CM | POA: Diagnosis not present

## 2021-05-07 LAB — POCT URINALYSIS DIPSTICK
Appearance: NORMAL
Bilirubin, UA: NEGATIVE
Blood, UA: NEGATIVE
Glucose, UA: NEGATIVE
Ketones, UA: NEGATIVE
Leukocytes, UA: NEGATIVE
Nitrite, UA: NEGATIVE
Protein, UA: NEGATIVE
Spec Grav, UA: 1.015 (ref 1.010–1.025)
Urobilinogen, UA: 0.2 E.U./dL
pH, UA: 6 (ref 5.0–8.0)

## 2021-05-07 MED ORDER — VIBEGRON 75 MG PO TABS: 1.0000 | ORAL_TABLET | Freq: Every day | ORAL | 5 refills | Status: DC

## 2021-05-07 NOTE — Progress Notes (Signed)
[facility] New Patient Evaluation and Consultation  Referring Provider: Ladell Pier, MD PCP: Ladell Pier, MD Date of Service: 05/07/2021  SUBJECTIVE Chief Complaint: New Patient (Initial Visit)- incontinence  History of Present Illness: Jodi [name] is a 75 y.o.  hispanic  female seen in consultation at the request of Dr. Wynetta Emery for evaluation of incontinence.    Review of records significant for: Currently taking Myrbertiq but still having bothersome symptoms. Can barely make it to the restroom.   Urinary Symptoms: Leaks urine with cough/ sneeze, with a full bladder, and with urgency. UUI> SUI Leaks 2-3 time(s) per day.  Pad use: none She is bothered by her UI symptoms. Taking Myrbetriq 50mg  daily for almost a year and has not noticed any symptom improvement.   Day time voids 6-7.  Nocturia: 1 times per night to void. Voiding dysfunction: she empties her bladder well.  does not use a catheter to empty bladder.  When urinating, she feels a weak stream, dribbling after finishing, the need to urinate multiple times in a row, and to push on her belly or vagina to empty bladder Drinks: 1 cup coffee in AM, otherwise only water and milk per day  UTIs:  0  UTI's in the last year.   Denies history of blood in urine and kidney or bladder stones  Pelvic Organ Prolapse Symptoms:                  She Denies a feeling of a bulge the vaginal area.   Bowel Symptom: Bowel movements: daily  Stool consistency: hard Straining: yes.  Splinting: yes.  Incomplete evacuation: no.  She Denies accidental bowel leakage / fecal incontinence Bowel regimen: none Last colonoscopy: Date 07/2019, Results- neg  Sexual Function Sexually active: no.   Pelvic Pain Denies pelvic pain   Past Medical History:  Past Medical History:  Diagnosis Date   Anemia    Diabetes mellitus without complication ([place])    Hypertension    Insomnia    Polio    in childhood      Past Surgical History:   Past Surgical History:  Procedure Laterality Date   CESAREAN SECTION     SHOULDER SURGERY Left 2010     Past OB/GYN History: G2 P2 Vaginal deliveries: 0,  Forceps/ Vacuum deliveries: 0, Cesarean section: 2 Menopausal: Yes, Denies vaginal bleeding since menopause   Medications: She has a current medication list which includes the following prescription(s): vibegron, accu-chek softclix lancets, alendronate, amlodipine, atorvastatin, accu-chek aviva plus, accu-chek guide me, calcium, diclofenac sodium, lumbar back brace/support pad, ferrous sulfate, accu-chek guide, hydrochlorothiazide, fusion plus, accu-chek softclix, accu-chek fastclix lancet, metformin, methocarbamol, omeprazole, senna-docusate, triamcinolone cream, and vitamin d (cholecalciferol).   Allergies: Patient is allergic to cortisone, gabapentin, pork-derived products, and shellfish allergy.   Social History:  Social History   Tobacco Use   Smoking status: Never   Smokeless tobacco: Never  Vaping Use   Vaping Use: Never used  Substance Use Topics   Alcohol use: No   Drug use: No    Relationship status: single She lives alone She is not employed. Regular exercise: Yes:   History of abuse: No  Family History:   Family History  Family history unknown: Yes     Review of Systems: Review of Systems  Constitutional:  Positive for malaise/fatigue and weight loss. Negative for fever.  Respiratory:  Negative for cough, shortness of breath and wheezing.   Cardiovascular:  Positive for palpitations and leg  swelling. Negative for chest pain.  Gastrointestinal:  Negative for abdominal pain and blood in stool.  Genitourinary:  Negative for dysuria.  Musculoskeletal:  Negative for myalgias.  Skin:  Negative for rash.  Neurological:  Positive for dizziness and headaches.  Endo/Heme/Allergies:  Does not bruise/bleed easily.  Psychiatric/Behavioral:  Positive for depression. The patient is  nervous/anxious.     OBJECTIVE Physical Exam: Vitals:   05/07/21 1322  BP: (!) 155/89  Pulse: 79  Weight: 143 lb (64.9 kg)  Height: 5' (1.524 m)    Physical Exam Constitutional:      General: She is not in acute distress. Pulmonary:     Effort: Pulmonary effort is normal.  Abdominal:     General: There is no distension.     Palpations: Abdomen is soft.     Tenderness: There is no abdominal tenderness. There is no rebound.  Musculoskeletal:        General: No swelling. Normal range of motion.  Skin:    General: Skin is warm and dry.     Findings: No rash.  Neurological:     Mental Status: She is alert and oriented to person, place, and time.  Psychiatric:        Mood and Affect: Mood normal.        Behavior: Behavior normal.     GU / Detailed Urogynecologic Evaluation:  Pelvic Exam: Normal external female genitalia; Bartholin's and Skene's glands normal in appearance; urethral meatus normal in appearance, no urethral masses or discharge.   CST: negative  Speculum exam reveals normal vaginal mucosa with atrophy. Cervix normal appearance. Uterus normal single, nontender. Adnexa no mass, fullness, tenderness.     Pelvic floor strength IV/V  Pelvic floor musculature: Right levator non-tender, Right obturator non-tender, Left levator non-tender, Left obturator non-tender  POP-Q:   POP-Q  -3                                            Aa   -3                                           Ba  -9                                              C   2                                            Gh  3                                            Pb  10                                            tvl   -2  Ap  -2                                            Bp  -10                                              D     Rectal Exam:  Normal external rectum  Post-Void Residual (PVR) by Bladder Scan: In order to evaluate bladder  emptying, we discussed obtaining a postvoid residual and she agreed to this procedure.  Procedure: The ultrasound unit was placed on the patient's abdomen in the suprapubic region after the patient had voided. A PVR of 6 ml was obtained by bladder scan.  Laboratory Results: POC urine: negative  ASSESSMENT AND PLAN [name] is a 75 y.o. with:  1. Overactive bladder   2. Urinary frequency   3. SUI (stress urinary incontinence, female)     OAB - We discussed the symptoms of overactive bladder (OAB), which include urinary urgency, urinary frequency, nocturia, with or without urge incontinence.  While we do not know the exact etiology of OAB, several treatment options exist. We discussed management including behavioral therapy (decreasing bladder irritants, urge suppression strategies, timed voids, bladder retraining), physical therapy, medication; for refractory cases posterior tibial nerve stimulation, sacral neuromodulation, and intravesical botulinum toxin injection.  - Will stop Myrbetriq since has not seen improvement and start Gemtesa 75mg  daily. Samples provided.  - If medication does not work, she is interested in third line therapy.  2. SUI - rare, did not discuss treatment options today since OAB is more bothersome.    Jaquita Folds, MD   Medical Decision Making:  - Reviewed/ ordered a clinical laboratory test - Review and summation of prior records

## 2021-05-09 ENCOUNTER — Other Ambulatory Visit: Payer: Self-pay

## 2021-05-09 ENCOUNTER — Ambulatory Visit: Payer: Medicare HMO | Attending: Internal Medicine | Admitting: Internal Medicine

## 2021-05-09 ENCOUNTER — Encounter: Payer: Self-pay | Admitting: Internal Medicine

## 2021-05-09 VITALS — BP 110/80 | HR 76 | Resp 16 | Wt 147.0 lb

## 2021-05-09 DIAGNOSIS — R0981 Nasal congestion: Secondary | ICD-10-CM

## 2021-05-09 DIAGNOSIS — R42 Dizziness and giddiness: Secondary | ICD-10-CM | POA: Diagnosis not present

## 2021-05-09 DIAGNOSIS — H9201 Otalgia, right ear: Secondary | ICD-10-CM

## 2021-05-09 DIAGNOSIS — Z23 Encounter for immunization: Secondary | ICD-10-CM | POA: Diagnosis not present

## 2021-05-09 MED ORDER — LORATADINE 10 MG PO TABS: 10.0000 mg | ORAL_TABLET | Freq: Every day | ORAL | 0 refills | Status: DC

## 2021-05-09 MED ORDER — FLUTICASONE PROPIONATE 50 MCG/ACT NA SUSP: 1.0000 | Freq: Every day | NASAL | 0 refills | Status: DC | PRN

## 2021-05-09 MED ORDER — MECLIZINE HCL 12.5 MG PO TABS: 12.5000 mg | ORAL_TABLET | Freq: Every day | ORAL | 0 refills | Status: DC | PRN

## 2021-05-09 NOTE — Progress Notes (Signed)
Patient ID: Jodi [name], female    DOB: 03/27/1946  MRN: [id number]  CC: Ear pain and vertigo  Subjective: Jodi [name] is a 75 y.o. female who presents for chronic ds manage Her concerns today include:  Pt  with hx of GERD, osteoporosis, Vit D def, OAB, diverticulosis, iron def, HTN, HL and DM.     Reports having COVID infection 03/10/2021.  Did not take any antiviral.  She let it run its course.  Had 3 COVID vaccines so far. -since having COVID, she feels like there is swelling in both ears RT>LT. Also some itching in the ears at times.  No drainage or decreased hearing.  She has also had intermittent vertigo since COVID.  She notices the vertigo when she looks up, when she sits up in bed, when rolling over in bed and when she goes from sitting to standing.  She has to hold on to objects sometimes when she first stands up.  Episodes last several seconds.  Has had 2 near falls. Other issue that has persist since COVID infection is sinus congestion and drainage.  Always feels like phlegm in throat.  Patient Active Problem List   Diagnosis Date Noted   Mild major depression, single episode ([place]) 01/15/2020   Overweight 01/15/2020   Overactive bladder 01/15/2020   Hyperlipidemia 05/13/2019   Age-related osteoporosis without current pathological fracture 03/08/2019   Chronic pain 01/01/2019   Medication refill 01/01/2019   Symptomatic anemia    Iron deficiency anemia due to chronic blood loss 10/14/2017   HTN (hypertension), benign 03/16/2017   Primary insomnia 03/16/2017   Osteoporosis 01/08/2017   GERD (gastroesophageal reflux disease) 01/08/2017   Vitamin D deficiency 01/08/2017   Edema 03/13/2014     Current Outpatient Medications on File Prior to Visit  Medication Sig Dispense Refill   ACCU-CHEK SOFTCLIX LANCETS lancets Use as instructed to test blood glucose twice daily. 100 each 12   alendronate (FOSAMAX) 70 MG tablet Take 1 tablet (70 mg total) by mouth once a  week. TAKE 1 TABLET (70 MG TOTAL) BY MOUTH ONCE A WEEK. 13 tablet 3   amLODipine (NORVASC) 5 MG tablet Take 0.5 tablets (2.5 mg total) by mouth daily. 45 tablet 3   atorvastatin (LIPITOR) 10 MG tablet Take 1 tablet (10 mg total) by mouth daily. 90 tablet 2   Blood Glucose Monitoring Suppl (ACCU-CHEK AVIVA PLUS) w/Device KIT To test blood glucose twice daily. 1 kit 0   Blood Glucose Monitoring Suppl (ACCU-CHEK GUIDE ME) w/Device KIT Use as directed 1 kit 0   CALCIUM PO Take 1 tablet by mouth daily.     diclofenac Sodium (VOLTAREN) 1 % GEL Apply 4 g topically 4 (four) times daily. Left knee. 100 g 6   Elastic Bandages & Supports (LUMBAR BACK BRACE/SUPPORT PAD) MISC 1 application by Does not apply route daily. 1 each 0   ferrous sulfate 325 (65 FE) MG tablet Take 1 tablet (325 mg total) by mouth daily with breakfast. 90 tablet 1   glucose blood (ACCU-CHEK GUIDE) test strip Use as instructed 100 each 12   hydrochlorothiazide (HYDRODIURIL) 12.5 MG tablet TOME UNA TABLETA TODOS LOS DIAS 90 tablet 1   Iron-FA-B Cmp-C-Biot-Probiotic (FUSION PLUS) CAPS Take 1 tablet by mouth every other day. As tolerated 90 capsule 2   Lancet Devices (ACCU-CHEK SOFTCLIX) lancets Use as instructed to test blood glucose twice daily. 1 each 0   Lancets Misc. (ACCU-CHEK FASTCLIX LANCET) KIT Use as directed 1 kit  6   metFORMIN (GLUCOPHAGE) 500 MG tablet Take 1 tablet (500 mg total) by mouth daily with breakfast. 90 tablet 1   methocarbamol (ROBAXIN) 500 MG tablet Take 1 tablet (500 mg total) by mouth 2 (two) times daily. 30 tablet 0   omeprazole (PRILOSEC) 40 MG capsule TOME UNA CAPSULA TODOS LOS DIAS 90 capsule 2   senna-docusate (SENOKOT-S) 8.6-50 MG tablet Take 1 tablet by mouth at bedtime as needed for mild constipation. 120 tablet 3   triamcinolone cream (KENALOG) 0.1 % Apply 1 application topically 2 (two) times daily. 15 g 0   Vibegron 75 MG TABS Take 1 tablet by mouth daily. 30 tablet 5   Vitamin D, Cholecalciferol, 10  MCG (400 UNIT) CHEW Chew 2 tablets (800 Units total) by mouth daily. 60 tablet 6   No current facility-administered medications on file prior to visit.    Allergies  Allergen Reactions   Cortisone     Rash, swelling   Gabapentin Nausea Only    With dizziness   Pork-Derived Products     Rash, joint pain   Shellfish Allergy Rash and Other (See Comments)    joint pain, GI pain    Social History   Socioeconomic History   Marital status: Single    Spouse name: Not on file   Number of children: Not on file   Years of education: Not on file   Highest education level: Not on file  Occupational History   Not on file  Tobacco Use   Smoking status: Never   Smokeless tobacco: Never  Vaping Use   Vaping Use: Never used  Substance and Sexual Activity   Alcohol use: No   Drug use: No   Sexual activity: Not Currently  Other Topics Concern   Not on file  Social History Narrative   Not on file   Social Determinants of Health   Financial Resource Strain: Low Risk    Difficulty of Paying Living Expenses: Not hard at all  Food Insecurity: No Food Insecurity   Worried About Charity fundraiser in the Last Year: Never true   Ran Out of Food in the Last Year: Never true  Transportation Needs: No Transportation Needs   Lack of Transportation (Medical): No   Lack of Transportation (Non-Medical): No  Physical Activity: Sufficiently Active   Days of Exercise per Week: 7 days   Minutes of Exercise per Session: 40 min  Stress: Stress Concern Present   Feeling of Stress : Rather much  Social Connections: Moderately Isolated   Frequency of Communication with Friends and Family: More than three times a week   Frequency of Social Gatherings with Friends and Family: More than three times a week   Attends Religious Services: More than 4 times per year   Active Member of [employer] or Organizations: No   Attends Music therapist: Never   Marital Status: Divorced  Human resources officer  Violence: Not At Risk   Fear of Current or Ex-Partner: No   Emotionally Abused: No   Physically Abused: No   Sexually Abused: No    Family History  Family history unknown: Yes    Past Surgical History:  Procedure Laterality Date   CESAREAN SECTION     SHOULDER SURGERY Left 2010    ROS: Review of Systems Negative except as stated above  PHYSICAL EXAM: BP 110/80   Pulse 76   Resp 16   Wt 147 lb (66.7 kg)   SpO2 98%  BMI 28.71 kg/m   Wt Readings from Last 3 Encounters:  05/09/21 147 lb (66.7 kg)  05/07/21 143 lb (64.9 kg)  02/05/21 145 lb (65.8 kg)    Sitting:  BP 138/88  , P74 Standing:  BP 145/96  , P 88  Physical Exam  General appearance - alert, well appearing, elderly hispanic female and in no distress Mental status - normal mood, behavior, speech, dress, motor activity, and thought processes Eyes - slightly pale conjunctiva Ears: pt has mild tenderness on palpation of RT preauricular area but no LN appreciated.  No edema noted in ear canals.  Reflection of light from RT TM seems more intense and larger than on LT Nose: mild enlargement of nasal turbinates.  Nasal mucosa dry Mouth - oral mucosa is moist. Throat clear Chest - clear to auscultation, no wheezes, rales or rhonchi, symmetric air entry Heart - heart sounds regular without murmur Neurological - pt does not have an assistive device.  pt noted to get up from chair and get up on exam table unassisted.  My CMA infomed me that when she performed orthostatic check on pt, she did have to hold on to exam table when she stood up.   Cns grossly intact.  No nystagmus noted, EOMI, PERRLA, no facial droop. Power 5/5 proximal and distal in Ues and LEs Extremities - no LE edema   CMP Latest Ref Rng & Units 01/07/2021 05/12/2019 03/25/2018  Glucose 65 - 99 mg/dL 108(H) 102(H) 108(H)  BUN 8 - 27 mg/dL 16 16 15   Creatinine 0.57 - 1.00 mg/dL 0.56(L) 0.63 0.72  Sodium 134 - 144 mmol/L 141 139 142  Potassium 3.5 - 5.2  mmol/L 4.5 4.3 4.1  Chloride 96 - 106 mmol/L 102 104 107  CO2 20 - 29 mmol/L 23 24 25   Calcium 8.7 - 10.3 mg/dL 9.9 9.3 9.6  Total Protein 6.0 - 8.5 g/dL 7.4 7.0 7.5  Total Bilirubin 0.0 - 1.2 mg/dL 0.3 0.5 0.6  Alkaline Phos 44 - 121 IU/L 95 86 74  AST 0 - 40 IU/L 20 19 19   ALT 0 - 32 IU/L 15 16 13    Lipid Panel     Component Value Date/Time   CHOL 229 (H) 01/07/2021 1554   TRIG 181 (H) 01/07/2021 1554   HDL 66 01/07/2021 1554   CHOLHDL 3.5 01/07/2021 1554   CHOLHDL 2.8 04/02/2016 1042   VLDL 21 04/02/2016 1042   LDLCALC 131 (H) 01/07/2021 1554    CBC    Component Value Date/Time   WBC 6.8 01/07/2021 1554   WBC 4.4 03/25/2018 0912   WBC 5.1 10/15/2017 0820   RBC 4.19 01/07/2021 1554   RBC 4.16 03/25/2018 0912   RBC 4.15 03/25/2018 0912   HGB 12.4 01/07/2021 1554   HCT 38.0 01/07/2021 1554   PLT 404 01/07/2021 1554   MCV 91 01/07/2021 1554   MCH 29.6 01/07/2021 1554   MCH 30.6 03/25/2018 0912   MCHC 32.6 01/07/2021 1554   MCHC 32.6 03/25/2018 0912   RDW 13.4 01/07/2021 1554   LYMPHSABS 1.3 03/25/2018 0912   MONOABS 0.5 03/25/2018 0912   EOSABS 0.2 03/25/2018 0912   BASOSABS 0.1 03/25/2018 0912    ASSESSMENT AND PLAN: 1. Ear pain, right Does not appear infected on exam.  However, it is quit bothersome to her so I will refer to ENT - Ambulatory referral to ENT  2. Vertigo -seems more c/w BPV Will check CBC and if okay, consider CT head and  referral for P.T vestibular training. Stay hydrated and go slow with position changes. Low dose Meclizine to use PRN in mean time. - meclizine (ANTIVERT) 12.5 MG tablet; Take 1 tablet (12.5 mg total) by mouth daily as needed for dizziness.  Dispense: 30 tablet; Refill: 0 - CBC - Comprehensive metabolic panel  3. Sinus congestion Recommend Claritin and Flonase nasal spray.  I note allergy on chart to Cortisone.  Pt states it was to Cortisone cream years ago.  I informed her that the Flonase dose have a steroid in it.  If  she notes any swelling, rash or problems breathing with use, she should take some Benadryl and be seen in ER immediately.  She expressed understanding - loratadine (CLARITIN) 10 MG tablet; Take 1 tablet (10 mg total) by mouth daily.  Dispense: 30 tablet; Refill: 0 - fluticasone (FLONASE) 50 MCG/ACT nasal spray; Place 1 spray into both nostrils daily as needed for allergies or rhinitis.  Dispense: 9.9 g; Refill: 0  4. Need for immunization against influenza - Flu Vaccine QUAD 4moIM (Fluarix, Fluzone & Alfiuria Quad PF)    Patient was given the opportunity to ask questions.  Patient verbalized understanding of the plan and was able to repeat key elements of the plan.  AMN Language interpreter used during this encounter. ##[id number] Quan Orders Placed This Encounter  Procedures   Flu Vaccine QUAD 640moM (Fluarix, Fluzone & Alfiuria Quad PF)   CBC   Comprehensive metabolic panel   Ambulatory referral to ENT   PT Vestibular Evaluation      Requested Prescriptions   Signed Prescriptions Disp Refills   meclizine (ANTIVERT) 12.5 MG tablet 30 tablet 0    Sig: Take 1 tablet (12.5 mg total) by mouth daily as needed for dizziness.   loratadine (CLARITIN) 10 MG tablet 30 tablet 0    Sig: Take 1 tablet (10 mg total) by mouth daily.   fluticasone (FLONASE) 50 MCG/ACT nasal spray 9.9 g 0    Sig: Place 1 spray into both nostrils daily as needed for allergies or rhinitis.    No follow-ups on file.  DeKarle PlumberMD, FACP

## 2021-05-09 NOTE — Patient Instructions (Signed)
Vrtigo posicional benigno Benign Positional Vertigo El vrtigo es la sensacin de que usted o todo lo que lo rodea se mueve cuando en realidad eso no sucede. El vrtigo posicional benigno es el tipo de vrtigo ms comn. Generalmente se trata de una enfermedad no daina (benigna). Esta afeccin es posicional. Esto significa que los sntomas son desencadenados por ciertos movimientos y posiciones. Esta afeccin puede ser peligrosa si ocurre mientras est haciendo algo que podra suponer un riesgo para usted o para los dems. Incluye actividades como conducir u operar [place]. Cules son las causas? El odo interno tiene canales llenos de lquido que ayudan a que el cerebro perciba el movimiento y el equilibrio. Cuando el lquido se mueve, el cerebro recibe mensajes sobre la posicin del cuerpo. En el vrtigo posicional benigno, los cristales de calcio que estn en el odo interno se desprenden y alteran la zona del odo interno. Esto hace que el cerebro reciba mensajes confusos sobre la posicin del cuerpo. Qu incrementa el riesgo? Es ms probable que sufra esta afeccin si: Es mujer. Es mayor de 1 aos. Ha sufrido una lesin en la cabeza recientemente. Tiene una enfermedad en el odo interno. Cules son los signos o sntomas? Generalmente, los sntomas de este trastorno se presentan al mover la cabeza o los ojos en diferentes direcciones. Los sntomas pueden aparecer repentinamente y suelen durar menos de un minuto. Incluyen los siguientes: Prdida del equilibrio y cadas. Sensacin de estar dando vueltas o movindose. Sensacin de que el entorno est dando vueltas o movindose. Nuseas y vmitos. Visin borrosa. Mareos. Movimientos oculares involuntarios (nistagmo). Los sntomas pueden ser leves y solo causar problemas menores, o pueden ser graves e interferir en la vida cotidiana. Los episodios de vrtigo posicional benigno pueden repetirse (volver a Arts administrator) con el transcurso del  tiempo. Los sntomas tambin pueden mejorar con [employer]. Cmo se diagnostica? Esta afeccin se puede diagnosticar en funcin de lo siguiente: Sus antecedentes mdicos. Un examen fsico de la cabeza, el cuello y los odos. Pruebas de posicin para detectar o estimular el vrtigo. Es posible que le pidan que gire la cabeza y [place] de posicin, como pasar de estar sentado a Acupuncturist. El mdico estar pendiente por si aparecen sntomas de vrtigo. Tal vez lo deriven a Land en problemas de la garganta, la nariz y el odo (otorrinolaringlogo), o a uno que se especializa en trastornos del sistema nervioso (neurlogo). Cmo se trata? Esta afeccin se puede tratar en una sesin en la cual el mdico le pondr la cabeza en posiciones especficas para ayudar a que los cristales desplazados del odo interno se [place]. El tratamiento de esta afeccin puede llevar varias sesiones. [employer] casos son graves, tal vez haya que realizar una ciruga, pero esto no es frecuente. En algunos casos, el vrtigo posicional benigno se resuelve por s solo en el trmino de 2 o 4 semanas. Siga estas instrucciones en su casa: Seguridad Muvase lentamente. Evite algunas posiciones o determinados movimientos repentinos de la cabeza y el cuerpo como se lo haya indicado el mdico. Evite conducir y Administrator, arts maquinaria hasta que el mdico le indique que es seguro [place]. No haga ninguna tarea que podra ser peligrosa para usted o para Producer, television/film/video en caso de vrtigo. Si tiene dificultad para caminar o mantener el equilibrio, use un bastn para Consulting civil engineer estabilidad. Si se siente mareado o inestable, sintese de inmediato. Retome sus actividades normales segn lo indicado por el mdico. Pregntele al mdico qu actividades son seguras  para usted. Instrucciones generales Use los medicamentos de venta libre y los recetados solamente como se lo haya indicado el mdico. Beba suficiente lquido como para  Theatre manager la orina de color amarillo plido. Concurra a [place]. Esto es importante. Comunquese con un mdico si: Tiene fiebre. Su afeccin empeora o presenta sntomas nuevos. Sus familiares o amigos advierten cambios en su comportamiento. Tiene nuseas o vmitos que empeoran. Tiene adormecimiento o sensacin de pinchazos y hormigueo. Busque ayuda de inmediato si: Tiene dificultad para hablar o para moverse. Siempre est mareado o se desmaya. Presenta dolores de cabeza intensos. Tiene debilidad en los brazos o las piernas. Presenta cambios en la audicin o la visin. Presenta rigidez en el cuello. Presenta sensibilidad a la luz. Estos sntomas pueden representar un problema grave que constituye Engineer, maintenance (IT). No espere a ver si los sntomas desaparecen. Solicite atencin mdica de inmediato. Comunquese con el servicio de emergencias de su localidad (911 en los [place]). No conduzca por sus propios medios [employer]. Resumen El vrtigo es la sensacin de que usted o todo lo que lo rodea se mueve cuando en realidad eso no sucede. El vrtigo posicional benigno es el tipo de vrtigo ms comn. La causa de esta afeccin es el desplazamiento de los cristales de calcio del odo interno. Esto produce una alteracin en una zona del odo interno que ayuda al cerebro a percibir el movimiento y el equilibrio. Los sntomas incluyen prdida del equilibrio y cadas, sensacin de que usted o su entorno se mueve, nuseas y vmitos, y visin borrosa. Esta afeccin se puede diagnosticar en funcin de los sntomas, un examen fsico y pruebas de posicionamiento. Siga las instrucciones de seguridad que le haya dado el mdico y vaya a todas las visitas de seguimiento. Esto es importante. Esta informacin no tiene Marine scientist el consejo del mdico. Asegrese de hacerle al mdico cualquier pregunta que tenga. Document Revised: 07/01/2020 Document Reviewed:  07/01/2020 Elsevier Patient Education  2022 [employer].

## 2021-05-09 NOTE — Progress Notes (Signed)
Patient states since she has had COVID she has been having symptoms such as lack of sleep, Inflammation, and imbalances.

## 2021-05-11 ENCOUNTER — Other Ambulatory Visit: Payer: Self-pay

## 2021-05-11 ENCOUNTER — Emergency Department (HOSPITAL_COMMUNITY)
Admission: EM | Admit: 2021-05-11 | Discharge: 2021-05-11 | Disposition: A | Discharge status: Home / Self Care | Payer: Medicare HMO | Attending: Emergency Medicine | Admitting: Emergency Medicine

## 2021-05-11 ENCOUNTER — Encounter (HOSPITAL_COMMUNITY): Payer: Self-pay

## 2021-05-11 DIAGNOSIS — R42 Dizziness and giddiness: Secondary | ICD-10-CM | POA: Insufficient documentation

## 2021-05-11 DIAGNOSIS — H9319 Tinnitus, unspecified ear: Secondary | ICD-10-CM | POA: Insufficient documentation

## 2021-05-11 DIAGNOSIS — Z5321 Procedure and treatment not carried out due to patient leaving prior to being seen by health care provider: Secondary | ICD-10-CM | POA: Insufficient documentation

## 2021-05-11 NOTE — ED Notes (Signed)
Called x1, no response.

## 2021-05-11 NOTE — ED Triage Notes (Signed)
Pt to ED by POV from home with c/o vertigo. Pt states this started 2 weeks ago. She was seen and treated, but states the medication causes ringing in her ear and pain. Arrives A+O, VSS, NADN.

## 2021-05-11 NOTE — ED Notes (Signed)
Called x2, no response.

## 2021-05-13 ENCOUNTER — Ambulatory Visit (HOSPITAL_COMMUNITY)
Admission: EM | Admit: 2021-05-13 | Discharge: 2021-05-13 | Disposition: A | Discharge status: Home / Self Care | Payer: Medicare HMO

## 2021-05-13 ENCOUNTER — Encounter (HOSPITAL_COMMUNITY): Payer: Self-pay | Admitting: Emergency Medicine

## 2021-05-13 ENCOUNTER — Encounter (HOSPITAL_COMMUNITY): Payer: Self-pay

## 2021-05-13 DIAGNOSIS — H9311 Tinnitus, right ear: Secondary | ICD-10-CM

## 2021-05-13 MED ORDER — KETOROLAC TROMETHAMINE 30 MG/ML IJ SOLN
INTRAMUSCULAR | Status: AC
  Filled 2021-05-13: qty 1

## 2021-05-13 MED ORDER — KETOROLAC TROMETHAMINE 30 MG/ML IJ SOLN
30.0000 mg | Freq: Once | INTRAMUSCULAR | Status: AC
  Administered 2021-05-13: 30 mg via INTRAMUSCULAR

## 2021-05-13 MED ORDER — DIMENHYDRINATE 50 MG PO TABS
50.0000 mg | ORAL_TABLET | Freq: Three times a day (TID) | ORAL | 0 refills | Status: DC | PRN
Start: 2021-05-13 — End: 2022-11-24

## 2021-05-13 NOTE — ED Triage Notes (Signed)
Pt presents with dizziness. States was prescribed Meclizine and Loratadine and now has very loud ringing in ears.

## 2021-05-13 NOTE — Discharge Instructions (Signed)
SwitchYou have been given an injection today to try to help reduce any inflammation or irritation in hopes to reduce the ringing sound in your ear  Your medication for dizziness to Dramamine which you may take every 8 hours as needed  Please notify your primary doctor of your reaction to the medication she prescribed and follow-up in 1 week  I have given you information for a ear specialist, please call and make an appointment for evaluation if symptoms persist

## 2021-05-13 NOTE — ED Provider Notes (Signed)
[place]    CSN: [id number] Arrival date & time: 05/13/21  0846      History   Chief Complaint Chief Complaint  Patient presents with   Dizziness   Medication Reaction    HPI Jodi [name] is a 75 y.o. female.   Patient presents with ringing in in right ear beginning 2 days ago.  Endorsing loud noise is painful. seen on Friday by PCP for dizziness who prescribed meclizine and loratadine patient endorses that symptoms began after starting medication.  Endorses that dizziness has resolved. denies itching, discharge, fever, chills, decreased hearing, headaches, jaw pain, dental pain.  History of anemia, diabetes, hypertension, hyperlipidemia, vitamin D deficiency  Spanish interpreter used  Past Medical History:  Diagnosis Date   Anemia    Diabetes mellitus without complication ([place])    Hypertension    Insomnia    Polio    in childhood    Patient Active Problem List   Diagnosis Date Noted   Mild major depression, single episode ([place]) 01/15/2020   Overweight 01/15/2020   Overactive bladder 01/15/2020   Hyperlipidemia 05/13/2019   Age-related osteoporosis without current pathological fracture 03/08/2019   Chronic pain 01/01/2019   Medication refill 01/01/2019   Symptomatic anemia    Iron deficiency anemia due to chronic blood loss 10/14/2017   HTN (hypertension), benign 03/16/2017   Primary insomnia 03/16/2017   Osteoporosis 01/08/2017   GERD (gastroesophageal reflux disease) 01/08/2017   Vitamin D deficiency 01/08/2017   Edema 03/13/2014    Past Surgical History:  Procedure Laterality Date   CESAREAN SECTION     SHOULDER SURGERY Left 2010    OB History   No obstetric history on file.      Home Medications    Prior to Admission medications   Medication Sig Start Date End Date Taking? Authorizing Provider  dimenhyDRINATE (DRAMAMINE) 50 MG tablet Take 1 tablet (50 mg total) by mouth every 8 (eight) hours as needed. 05/13/21  Yes Dionel Archey,  Leitha Schuller, NP  ACCU-CHEK SOFTCLIX LANCETS lancets Use as instructed to test blood glucose twice daily. 07/15/16   Maren Reamer, MD  alendronate (FOSAMAX) 70 MG tablet Take 1 tablet (70 mg total) by mouth once a week. TAKE 1 TABLET (70 MG TOTAL) BY MOUTH ONCE A WEEK. 02/06/21   Shamleffer, Melanie Crazier, MD  amLODipine (NORVASC) 5 MG tablet Take 0.5 tablets (2.5 mg total) by mouth daily. 01/07/21   Ladell Pier, MD  amLODipine (NORVASC) 5 MG tablet Take 2.5 mg by mouth daily. 04/04/21   [provider]  atorvastatin (LIPITOR) 10 MG tablet Take 1 tablet (10 mg total) by mouth daily. 01/07/21   Ladell Pier, MD  atorvastatin (LIPITOR) 10 MG tablet TOME UNA TABLETA TODOS LOS D AS 04/04/21   [provider]  Blood Glucose Monitoring Suppl (ACCU-CHEK AVIVA PLUS) w/Device KIT To test blood glucose twice daily. 07/15/16   Maren Reamer, MD  Blood Glucose Monitoring Suppl (ACCU-CHEK GUIDE ME) w/Device KIT Use as directed 01/15/20   Ladell Pier, MD  CALCIUM PO Take 1 tablet by mouth daily.    [provider]  diclofenac Sodium (VOLTAREN) 1 % GEL Apply 4 g topically 4 (four) times daily. Left knee. 01/07/21   Ladell Pier, MD  Elastic Bandages & Supports (LUMBAR BACK BRACE/SUPPORT PAD) MISC 1 application by Does not apply route daily. 04/02/16   Maren Reamer, MD  ferrous sulfate 325 (65 FE) MG tablet Take 1 tablet (  325 mg total) by mouth daily with breakfast. 01/07/21   Ladell Pier, MD  ferrous sulfate 325 (65 FE) MG tablet TOME UNA TABLETA TODOS LOS D AS CON EL DESAYUNO 04/23/21   [provider]  fluticasone (FLONASE) 50 MCG/ACT nasal spray Place 1 spray into both nostrils daily as needed for allergies or rhinitis. 05/09/21   Ladell Pier, MD  GEMTESA 75 MG TABS TOME UNA TABLETA TODOS LOS D AS 05/08/21   [provider]  glucose blood (ACCU-CHEK GUIDE) test strip Use as instructed 01/15/20   Ladell Pier, MD   hydrochlorothiazide (HYDRODIURIL) 12.5 MG tablet TOME UNA TABLETA TODOS LOS DIAS 01/07/21   Ladell Pier, MD  hydrochlorothiazide (HYDRODIURIL) 12.5 MG tablet TOME UNA TABLETA TODOS LOS D AS 04/11/21   [provider]  Iron-FA-B Cmp-C-Biot-Probiotic (FUSION PLUS) CAPS Take 1 tablet by mouth every other day. As tolerated 10/10/16   Langeland, Dawn T, MD  Lancet Devices [facility]) lancets Use as instructed to test blood glucose twice daily. 07/15/16   Maren Reamer, MD  Lancets Misc. (ACCU-CHEK FASTCLIX LANCET) KIT Use as directed 01/15/20   Ladell Pier, MD  loratadine (CLARITIN) 10 MG tablet Take 1 tablet (10 mg total) by mouth daily. 05/09/21   Ladell Pier, MD  meclizine (ANTIVERT) 12.5 MG tablet Take 1 tablet (12.5 mg total) by mouth daily as needed for dizziness. 05/09/21   Ladell Pier, MD  metFORMIN (GLUCOPHAGE) 500 MG tablet Take 1 tablet (500 mg total) by mouth daily with breakfast. 01/07/21   Ladell Pier, MD  metFORMIN (GLUCOPHAGE) 500 MG tablet TOME UNA TABLETA TODOS LOS D AS CON EL DESAYUNO 04/04/21   [provider]  methocarbamol (ROBAXIN) 500 MG tablet Take 1 tablet (500 mg total) by mouth 2 (two) times daily. 03/06/20   Jaynee Eagles, PA-C  omeprazole (PRILOSEC) 40 MG capsule TOME UNA CAPSULA TODOS LOS DIAS 05/06/21   Ladell Pier, MD  omeprazole (PRILOSEC) 40 MG capsule TOME UNA C PSULA TODOS LOS D AS 05/06/21   [provider]  senna-docusate (SENOKOT-S) 8.6-50 MG tablet Take 1 tablet by mouth at bedtime as needed for mild constipation. 10/07/16   Maren Reamer, MD  triamcinolone cream (KENALOG) 0.1 % Apply 1 application topically 2 (two) times daily. 01/07/21   Ladell Pier, MD  Vibegron 75 MG TABS Take 1 tablet by mouth daily. 05/07/21   Jaquita Folds, MD  Vitamin D, Cholecalciferol, 10 MCG (400 UNIT) CHEW Chew 2 tablets (800 Units total) by mouth daily. 09/15/19   Ladell Pier, MD     Family History Family History  Family history unknown: Yes    Social History Social History   Tobacco Use   Smoking status: Never   Smokeless tobacco: Never  Vaping Use   Vaping Use: Never used  Substance Use Topics   Alcohol use: Not Currently   Drug use: Never     Allergies   Cortisone, Gabapentin, Pork-derived products, Shellfish allergy, Cortisone, and Shellfish allergy   Review of Systems Review of Systems  Constitutional: Negative.   HENT:  Positive for ear pain. Negative for congestion, dental problem, drooling, ear discharge, facial swelling, hearing loss, mouth sores, nosebleeds, postnasal drip, rhinorrhea, sinus pressure, sinus pain, sneezing, sore throat, tinnitus, trouble swallowing and voice change.   Skin: Negative.   Neurological: Negative.     Physical Exam Triage Vital Signs ED Triage Vitals  Enc Vitals Group  BP 05/13/21 0935 (!) 145/80     Pulse Rate 05/13/21 0935 70     Resp 05/13/21 0935 17     Temp 05/13/21 0935 97.8 F (36.6 C)     Temp Source 05/13/21 0935 Oral     SpO2 05/13/21 0935 95 %     Weight --      Height --      Head Circumference --      Peak Flow --      Pain Score 05/13/21 0930 5     Pain Loc --      Pain Edu? --      Excl. in [place]? --    No data found.  Updated Vital Signs BP (!) 145/80 (BP Location: Right Arm)   Pulse 70   Temp 97.8 F (36.6 C) (Oral)   Resp 17   SpO2 95%   Visual Acuity Right Eye Distance:   Left Eye Distance:   Bilateral Distance:    Right Eye Near:   Left Eye Near:    Bilateral Near:     Physical Exam Constitutional:      Appearance: Normal appearance. She is normal weight.  HENT:     Head: Normocephalic.     Right Ear: Tympanic membrane, ear canal and external ear normal.     Left Ear: Tympanic membrane, ear canal and external ear normal.     Nose: Nose normal.  Eyes:     Extraocular Movements: Extraocular movements intact.  Pulmonary:     Effort: Pulmonary effort is  normal.  Skin:    General: Skin is warm and dry.  Neurological:     Mental Status: She is alert and oriented to person, place, and time. Mental status is at baseline.  Psychiatric:        Mood and Affect: Mood normal.        Behavior: Behavior normal.     UC Treatments / Results  Labs (all labs ordered are listed, but only abnormal results are displayed) Labs Reviewed - No data to display  EKG   Radiology No results found.  Procedures Procedures (including critical care time)  Medications Ordered in UC Medications  ketorolac (TORADOL) 30 MG/ML injection 30 mg (30 mg Intramuscular Given 05/13/21 1021)    Initial Impression / Assessment and Plan / UC Course  I have reviewed the triage vital signs and the nursing notes.  Pertinent labs & imaging results that were available during my care of the patient were reviewed by me and considered in my medical decision making (see chart for details).  Tinnitus of right ear  Patient prescribed meclizine and loratadine which typically do not cause tinnitus however advise discontinuing these medications, prescribed Dramamine to be used instead, given Toradol injection in office today, advised patient to notify PCP and schedule follow-up appointment, given walking referral to ear nose throat specialist for further evaluation Final Clinical Impressions(s) / UC Diagnoses   Final diagnoses:  Tinnitus of right ear     Discharge Instructions      SwitchYou have been given an injection today to try to help reduce any inflammation or irritation in hopes to reduce the ringing sound in your ear  Your medication for dizziness to Dramamine which you may take every 8 hours as needed  Please notify your primary doctor of your reaction to the medication she prescribed and follow-up in 1 week  I have given you information for a ear specialist, please call and make an appointment  for evaluation if symptoms persist   ED Prescriptions      Medication Sig Dispense Auth. Provider   dimenhyDRINATE (DRAMAMINE) 50 MG tablet Take 1 tablet (50 mg total) by mouth every 8 (eight) hours as needed. 30 tablet Hans Eden, NP      PDMP not reviewed this encounter.   Hans Eden, NP 05/13/21 1230

## 2021-05-15 ENCOUNTER — Other Ambulatory Visit: Payer: Self-pay | Admitting: Internal Medicine

## 2021-05-15 DIAGNOSIS — L309 Dermatitis, unspecified: Secondary | ICD-10-CM

## 2021-05-16 ENCOUNTER — Ambulatory Visit: Payer: Self-pay

## 2021-05-16 NOTE — Telephone Encounter (Signed)
Using Spanish interpreter Tammi Klippel, # [phone], pt. Complaining of right ear pain. Pain today is 7-8/10. Seen in UC for this. Has an appointment with ENT Monday. Requesting appointment today because of increased pain. Has swelling to ear and decreased hearing. Please advise pt.    Reason for Disposition  [1] SEVERE pain AND [2] not improved 2 hours after taking analgesic medication (e.g., ibuprofen or acetaminophen)  Answer Assessment - Initial Assessment Questions 1. LOCATION: "Which ear is involved?"     Right ear 2. ONSET: "When did the ear start hurting"      Last week 3. SEVERITY: "How bad is the pain?"  (Scale 1-10; mild, moderate or severe)   - MILD (1-3): doesn't interfere with normal activities    - MODERATE (4-7): interferes with normal activities or awakens from sleep    - SEVERE (8-10): excruciating pain, unable to do any normal activities      8 4. URI SYMPTOMS: "Do you have a runny nose or cough?"     No 5. FEVER: "Do you have a fever?" If Yes, ask: "What is your temperature, how was it measured, and when did it start?"     No 6. CAUSE: "Have you been swimming recently?", "How often do you use Q-TIPS?", "Have you had any recent air travel or scuba diving?"     No 7. OTHER SYMPTOMS: "Do you have any other symptoms?" (e.g., headache, stiff neck, dizziness, vomiting, runny nose, decreased hearing)     Ear is swollen, hearing loss 8. PREGNANCY: "Is there any chance you are pregnant?" "When was your last menstrual period?"     No  Protocols used: Bethann Punches

## 2021-05-16 NOTE — Telephone Encounter (Signed)
Requested Prescriptions  Pending Prescriptions Disp Refills  . triamcinolone cream (KENALOG) 0.1 % [Pharmacy Med Name: TRIAMCINOLONE 0.1% CREAM] 30 g 1    Sig: APLIQUE AL AREA AFECTADA DOS VECES AL DIA     Dermatology:  Corticosteroids Passed - 05/15/2021 12:33 PM      Passed - Valid encounter within last 12 months    Recent Outpatient Visits          1 week ago Ear pain, right   [place] Karle Plumber B, MD   4 months ago Type 2 diabetes, controlled, with neuropathy [facility])   [place], Deborah B, MD   1 year ago Type 2 diabetes mellitus without complication, without long-term current use of insulin ([place])   [place], MD   1 year ago Acute pain of left knee   [place], Deborah B, MD   2 years ago Essential hypertension   [place], Deborah B, MD      Future Appointments            In 1 month Wannetta Sender, Governor Rooks, MD Urogynecology at [facility], [facility]

## 2021-05-18 MED ORDER — AMOXICILLIN-POT CLAVULANATE 500-125 MG PO TABS: 1.0000 | ORAL_TABLET | Freq: Two times a day (BID) | ORAL | 0 refills | Status: DC

## 2021-05-18 NOTE — Addendum Note (Signed)
Addended by: Karle Plumber B on: 05/18/2021 10:53 AM   Modules accepted: Orders

## 2021-05-19 DIAGNOSIS — H9113 Presbycusis, bilateral: Secondary | ICD-10-CM | POA: Diagnosis not present

## 2021-05-19 DIAGNOSIS — H9313 Tinnitus, bilateral: Secondary | ICD-10-CM | POA: Diagnosis not present

## 2021-05-19 DIAGNOSIS — M26629 Arthralgia of temporomandibular joint, unspecified side: Secondary | ICD-10-CM | POA: Diagnosis not present

## 2021-05-19 NOTE — Telephone Encounter (Signed)
Called pt unable to reach left VM RX sent to pharmacy

## 2021-05-28 ENCOUNTER — Encounter: Payer: Self-pay | Admitting: Physician Assistant

## 2021-05-28 ENCOUNTER — Other Ambulatory Visit: Payer: Self-pay

## 2021-05-28 ENCOUNTER — Ambulatory Visit: Payer: Medicare HMO | Attending: Physician Assistant | Admitting: Physician Assistant

## 2021-05-28 ENCOUNTER — Ambulatory Visit: Payer: Medicare HMO | Admitting: Physician Assistant

## 2021-05-28 VITALS — BP 139/86 | HR 98 | Temp 98.8°F | Resp 18 | Ht 62.0 in | Wt 144.0 lb

## 2021-05-28 DIAGNOSIS — H65191 Other acute nonsuppurative otitis media, right ear: Secondary | ICD-10-CM | POA: Diagnosis not present

## 2021-05-28 DIAGNOSIS — H9201 Otalgia, right ear: Secondary | ICD-10-CM

## 2021-05-28 MED ORDER — AMOXICILLIN-POT CLAVULANATE 500-125 MG PO TABS: 1.0000 | ORAL_TABLET | Freq: Two times a day (BID) | ORAL | 0 refills | Status: DC

## 2021-05-28 NOTE — Patient Instructions (Addendum)
You are going to take Augmentin twice a day for the next 7 days.  Please let us know if there is anything else we can do for you, I hope that you feel better soon. Kennieth Rad, PA-C Physician Assistant [facility] Medicine [email or web address]   Otitis media en los adultos Otitis Media, Adult La otitis media es la inflamacin y la acumulacin de lquido en el odo [place], que se manifiesta con signos y sntomas de [place] infeccin [place]. El odo medio es la parte del odo que contiene los huesos de la audicin, as Contractor aire que ayuda a Conservator, museum/gallery los sonidos al cerebro. Cuando se acumula lquido infectado en este espacio, genera presin y Astronomer una infeccin en el odo. La trompa de [place] conecta el odo medio con la parte posterior de la nariz (nasofaringe) y, normalmente, permite que ingrese aire en el odo [place]. Si la trompa de [place] se obstruye, puede acumularse lquido e infectarse. Cules son las causas? Esta afeccin es consecuencia de una obstruccin en la trompa de [place]. La causa puede ser una mucosidad o la hinchazn de la trompa. Algunos de los problemas que pueden causar Ardelia Mems obstruccin son los siguientes: Resfriados u otra infeccin de las vas respiratorias superiores. Alergias. Un irritante, como el humo del tabaco. Adenoides agrandadas. Las adenoides son zonas de tejido blando ubicadas en la parte posterior de la garganta, detrs de la nariz y Company secretary. Shanda Bumps parte del sistema de defensa del organismo (sistema inmunitario). Un bulto en la nasofaringe. Dao en el odo a causa de cambios de presin (barotraumatismo). Qu incrementa el riesgo? Es ms probable que tenga esta afeccin si: Fuma o se expone al humo de tabaco. Tiene una abertura en la parte superior de la boca (hendidura del paladar). Tiene reflujo gastroesofgico. Tiene un trastorno del sistema inmunitario. Cules son los signos o  sntomas? Los sntomas de esta afeccin incluyen: Dolor de odo. Cristy Hilts. Disminucin de la audicin. Cansancio (letargo). Supuracin de lquido por el odo, si el tmpano se rompe o revienta. Zumbidos en el odo. Cmo se diagnostica? Esta afeccin se diagnostica mediante un examen fsico. Durante el examen, con un instrumento llamado otoscopio, el mdico mirar dentro del odo para Geographical information systems officer, hinchazn y presencia de lquido. Tambin le preguntar acerca de sus sntomas. El mdico tambin puede indicarle [place], como: Una otoscopia neumtica. Es un estudio que se realiza para Academic librarian movimiento del tmpano. Se realiza introduciendo una pequea cantidad de aire en el odo. Un timpanograma. Es un estudio que muestra si el tmpano se [employer] en respuesta a la presin de aire en el canal [place]. Genera un grfico para que el mdico lo evale. Cmo se trata? Esta afeccin puede desaparecer sin tratamiento en el transcurso de 3 a 5 das. Sin embargo, si la causa de la afeccin es una infeccin bacteriana y no desaparece sin tratamiento, o si vuelve a aparecer ms de una vez, el mdico podra hacer lo siguiente: Recetarle antibiticos para tratar la infeccin. Recetarle o recomendarle medicamentos para Financial controller. Siga estas indicaciones en su casa: Use los medicamentos de venta libre y los recetados solamente como se lo haya indicado el mdico. Si le recetaron un antibitico, tmelo como se lo haya indicado el mdico. No deje de tomar el antibitico aunque comience a sentirse mejor. Concurra a [place]. Esto es importante. Comunquese con un mdico si: Le sangra la nariz. Tiene un bulto en el cuello. No se siente mejor  al cabo de 5 das. Empeora en lugar de mejorar. Solicite ayuda de inmediato si: Tiene dolor intenso que no puede controlar con medicamentos. Tiene hinchazn, enrojecimiento o dolor en el odo. Siente rigidez en el  cuello. Una parte de su rostro no se mueve (paralizada). Le duele el hueso que se encuentra detrs del odo (mastoides) al tocarlo. Desarrolla un dolor de cabeza intenso. Resumen La otitis media es el enrojecimiento, Conservation officer, historic buildings y la hinchazn del odo medio, que generalmente provoca dolor y disminucin de la audicin. Esta afeccin puede desaparecer sin tratamiento en el transcurso de 3 a 5 das. Si el problema no desaparece en el trmino de 3 a 5 das, el mdico puede recetarle medicamentos para tratar la infeccin. Si le recetaron un antibitico, tmelo como se lo haya indicado el mdico. Siga todas las instrucciones que le haya dado su mdico. Esta informacin no tiene Marine scientist el consejo del mdico. Asegrese de hacerle al mdico cualquier pregunta que tenga. Document Revised: 11/08/2020 Document Reviewed: 11/08/2020 Elsevier Patient Education  2022 [employer].

## 2021-05-28 NOTE — Progress Notes (Signed)
Patient has eaten and taken medication today. Patient repors ringing in the ear being present for 23 days without relief despite visits with PCP, ENT and dentistry.

## 2021-05-28 NOTE — Progress Notes (Signed)
Established Patient Office Visit  Subjective:  Patient ID: Jodi [name], female    DOB: Jan 18, 1946  Age: 75 y.o. MRN: [id number]  CC:  Chief Complaint  Patient presents with   Tinnitus    HPI Jodi MCARTHY reports that she continues to have discomfort and pain, ringing in her right ear.  States that she has been seen by several different providers for the same issue.  She saw her PCP on 05/09/21 and was prescribed a trial of Claritin and Flonase without relief.  She was also referred to ENT.  Reports that she was then seen in the urgent care on May 13, 2021 with same complaint.  Reports she was told to stop the meclizine and Claritin and start Dramamine instead.  States that this did not offer any relief.  States that she was then seen by ear nose and throat on May 19, 2021.  Note from that visit Impression & Plans:  Severe TMJ pain. Recommend she see her dentist as soon as possible. For now she can use ibuprofen as needed. Recommend a complete audiometric evaluation for the hearing loss and tinnitus. Recommend she avoid using Q-tips.  Jodi Salts, MD  States today that she did follow-up with dentistry and they told her that "nothing was wrong".  Does endorse that she had an unmeasured fever last night.  States that she feels the ear pain is intolerable.  Due to language barrier, an interpreter was present during the history-taking and subsequent discussion (and for part of the physical exam) with this patient.    Past Medical History:  Diagnosis Date   Anemia    Diabetes mellitus without complication ([place])    Hypertension    Insomnia    Polio    in childhood    Past Surgical History:  Procedure Laterality Date   CESAREAN SECTION     SHOULDER SURGERY Left 2010    Family History  Family history unknown: Yes    Social History   Socioeconomic History   Marital status: Unknown    Spouse name: Not on file   Number of children: Not on file    Years of education: Not on file   Highest education level: Not on file  Occupational History   Not on file  Tobacco Use   Smoking status: Never   Smokeless tobacco: Never  Vaping Use   Vaping Use: Never used  Substance and Sexual Activity   Alcohol use: Not Currently   Drug use: Never   Sexual activity: Not Currently  Other Topics Concern   Not on file  Social History Narrative   ** Merged History Encounter **       Social Determinants of Health   Financial Resource Strain: Low Risk    Difficulty of Paying Living Expenses: Not hard at all  Food Insecurity: No Food Insecurity   Worried About Charity fundraiser in the Last Year: Never true   [place] in the Last Year: Never true  Transportation Needs: No Transportation Needs   Lack of Transportation (Medical): No   Lack of Transportation (Non-Medical): No  Physical Activity: Sufficiently Active   Days of Exercise per Week: 7 days   Minutes of Exercise per Session: 40 min  Stress: Stress Concern Present   Feeling of Stress : Rather much  Social Connections: Moderately Isolated   Frequency of Communication with Friends and Family: More than three times a week   Frequency of Social Gatherings  with Friends and Family: More than three times a week   Attends Religious Services: More than 4 times per year   Active Member of Clubs or Organizations: No   Attends Archivist Meetings: Never   Marital Status: Divorced  Human resources officer Violence: Not At Risk   Fear of Current or Ex-Partner: No   Emotionally Abused: No   Physically Abused: No   Sexually Abused: No    Outpatient Medications Prior to Visit  Medication Sig Dispense Refill   ACCU-CHEK SOFTCLIX LANCETS lancets Use as instructed to test blood glucose twice daily. 100 each 12   alendronate (FOSAMAX) 70 MG tablet Take 1 tablet (70 mg total) by mouth once a week. TAKE 1 TABLET (70 MG TOTAL) BY MOUTH ONCE A WEEK. 13 tablet 3   amLODipine (NORVASC) 5 MG  tablet Take 0.5 tablets (2.5 mg total) by mouth daily. 45 tablet 3   atorvastatin (LIPITOR) 10 MG tablet Take 1 tablet (10 mg total) by mouth daily. 90 tablet 2   Blood Glucose Monitoring Suppl (ACCU-CHEK GUIDE ME) w/Device KIT Use as directed 1 kit 0   CALCIUM PO Take 1 tablet by mouth daily.     diclofenac Sodium (VOLTAREN) 1 % GEL Apply 4 g topically 4 (four) times daily. Left knee. 100 g 6   dimenhyDRINATE (DRAMAMINE) 50 MG tablet Take 1 tablet (50 mg total) by mouth every 8 (eight) hours as needed. 30 tablet 0   Elastic Bandages & Supports (LUMBAR BACK BRACE/SUPPORT PAD) MISC 1 application by Does not apply route daily. 1 each 0   ferrous sulfate 325 (65 FE) MG tablet Take 1 tablet (325 mg total) by mouth daily with breakfast. 90 tablet 1   fluticasone (FLONASE) 50 MCG/ACT nasal spray Place 1 spray into both nostrils daily as needed for allergies or rhinitis. 9.9 g 0   GEMTESA 75 MG TABS TOME UNA TABLETA TODOS LOS D AS     glucose blood (ACCU-CHEK GUIDE) test strip Use as instructed 100 each 12   hydrochlorothiazide (HYDRODIURIL) 12.5 MG tablet TOME UNA TABLETA TODOS LOS DIAS 90 tablet 1   Iron-FA-B Cmp-C-Biot-Probiotic (FUSION PLUS) CAPS Take 1 tablet by mouth every other day. As tolerated 90 capsule 2   Lancets Misc. (ACCU-CHEK FASTCLIX LANCET) KIT Use as directed 1 kit 6   loratadine (CLARITIN) 10 MG tablet Take 1 tablet (10 mg total) by mouth daily. 30 tablet 0   meclizine (ANTIVERT) 12.5 MG tablet Take 1 tablet (12.5 mg total) by mouth daily as needed for dizziness. 30 tablet 0   metFORMIN (GLUCOPHAGE) 500 MG tablet Take 1 tablet (500 mg total) by mouth daily with breakfast. 90 tablet 1   methocarbamol (ROBAXIN) 500 MG tablet Take 1 tablet (500 mg total) by mouth 2 (two) times daily. 30 tablet 0   omeprazole (PRILOSEC) 40 MG capsule TOME UNA CAPSULA TODOS LOS DIAS 90 capsule 2   senna-docusate (SENOKOT-S) 8.6-50 MG tablet Take 1 tablet by mouth at bedtime as needed for mild constipation.  120 tablet 3   triamcinolone cream (KENALOG) 0.1 % APLIQUE AL AREA AFECTADA DOS VECES AL DIA 30 g 1   Vibegron 75 MG TABS Take 1 tablet by mouth daily. 30 tablet 5   Vitamin D, Cholecalciferol, 10 MCG (400 UNIT) CHEW Chew 2 tablets (800 Units total) by mouth daily. 60 tablet 6   amoxicillin-clavulanate (AUGMENTIN) 500-125 MG tablet Take 1 tablet (500 mg total) by mouth 2 (two) times daily. 14 tablet 0  amLODipine (NORVASC) 5 MG tablet Take 2.5 mg by mouth daily.     atorvastatin (LIPITOR) 10 MG tablet TOME UNA TABLETA TODOS LOS D AS     Blood Glucose Monitoring Suppl (ACCU-CHEK AVIVA PLUS) w/Device KIT To test blood glucose twice daily. 1 kit 0   ferrous sulfate 325 (65 FE) MG tablet TOME UNA TABLETA TODOS LOS D AS CON [place] DES[place]UNO     hydrochlorothiazide (HYDRODIURIL) 12.5 MG tablet TOME UNA TABLETA TODOS LOS D AS     Lancet Devices (ACCU-CHEK SOFTCLIX) lancets Use as instructed to test blood glucose twice daily. 1 each 0   metFORMIN (GLUCOPHAGE) 500 MG tablet TOME UNA TABLETA TODOS LOS D AS CON EL DESAYUNO     omeprazole (PRILOSEC) 40 MG capsule TOME UNA C PSULA TODOS LOS D AS     No facility-administered medications prior to visit.    Allergies  Allergen Reactions   Cortisone     Rash, swelling   Gabapentin Nausea Only    With dizziness   Pork-Derived Products     Rash, joint pain   Shellfish Allergy    Cortisone Rash   Shellfish Allergy Rash and Other (See Comments)    joint pain, GI pain    ROS Review of Systems  Constitutional:  Positive for fever.  HENT:  Positive for ear pain. Negative for ear discharge, sinus pressure, sinus pain and sore throat.   Eyes: Negative.   Respiratory:  Negative for cough and shortness of breath.   Cardiovascular:  Negative for chest pain.  Gastrointestinal: Negative.   Endocrine: Negative.   Genitourinary: Negative.   Musculoskeletal: Negative.   Skin: Negative.   Allergic/Immunologic: Negative.   Neurological: Negative.    Hematological: Negative.   Psychiatric/Behavioral: Negative.       Objective:    Physical Exam Vitals and nursing note reviewed.  Constitutional:      Appearance: Normal appearance.  HENT:     Head: Normocephalic and atraumatic.     Right Ear: Ear canal and external ear normal. Tympanic membrane is bulging.     Left Ear: Tympanic membrane, ear canal and external ear normal.     Nose: Nose normal.     Mouth/Throat:     Mouth: Mucous membranes are moist.     Pharynx: Oropharynx is clear.  Eyes:     Extraocular Movements: Extraocular movements intact.     Conjunctiva/sclera: Conjunctivae normal.     Pupils: Pupils are equal, round, and reactive to light.  Cardiovascular:     Rate and Rhythm: Normal rate and regular rhythm.     Pulses: Normal pulses.     Heart sounds: Normal heart sounds.  Pulmonary:     Effort: Pulmonary effort is normal.     Breath sounds: Normal breath sounds.  Musculoskeletal:        General: Normal range of motion.     Cervical back: Normal range of motion and neck supple.  Skin:    General: Skin is warm and dry.  Neurological:     General: No focal deficit present.     Mental Status: She is alert and oriented to person, place, and time.  Psychiatric:        Mood and Affect: Mood normal.        Behavior: Behavior normal.        Thought Content: Thought content normal.        Judgment: Judgment normal.    BP 139/86 (BP Location: Left Arm, Patient Position:  Sitting, Cuff Size: Large)   Pulse 98   Temp 98.8 F (37.1 C) (Oral)   Resp 18   Ht _0  (1.575 m)   Wt 144 lb (65.3 kg)   SpO2 97%   BMI 26.34 kg/m  Wt Readings from Last 3 Encounters:  05/28/21 144 lb (65.3 kg)  05/11/21 147 lb 0.8 oz (66.7 kg)  05/09/21 147 lb (66.7 kg)     Health Maintenance Due  Topic Date Due   FOOT EXAM  04/02/2017   OPHTHALMOLOGY EXAM  05/12/2017   COVID-19 Vaccine (4 - Booster) 11/27/2019   Zoster Vaccines- Shingrix (2 of 2) 03/04/2021    There are no  preventive care reminders to display for this patient.  Lab Results  Component Value Date   TSH 1.27 02/13/2021   Lab Results  Component Value Date   WBC 6.8 01/07/2021   HGB 12.4 01/07/2021   HCT 38.0 01/07/2021   MCV 91 01/07/2021   PLT 404 01/07/2021   Lab Results  Component Value Date   NA 141 01/07/2021   K 4.5 01/07/2021   CO2 23 01/07/2021   GLUCOSE 108 (H) 01/07/2021   BUN 16 01/07/2021   CREATININE 0.56 (L) 01/07/2021   BILITOT 0.3 01/07/2021   ALKPHOS 95 01/07/2021   AST 20 01/07/2021   ALT 15 01/07/2021   PROT 7.4 01/07/2021   ALBUMIN 4.5 01/07/2021   CALCIUM 9.9 01/07/2021   ANIONGAP 10 03/25/2018   EGFR 94 01/07/2021   Lab Results  Component Value Date   CHOL 229 (H) 01/07/2021   Lab Results  Component Value Date   HDL 66 01/07/2021   Lab Results  Component Value Date   LDLCALC 131 (H) 01/07/2021   Lab Results  Component Value Date   TRIG 181 (H) 01/07/2021   Lab Results  Component Value Date   CHOLHDL 3.5 01/07/2021   Lab Results  Component Value Date   HGBA1C 5.8 01/07/2021      Assessment & Plan:   Problem List Items Addressed This Visit   None Visit Diagnoses     Ear pain, right    -  Primary   Relevant Medications   amoxicillin-clavulanate (AUGMENTIN) 500-125 MG tablet       Meds ordered this encounter  Medications   amoxicillin-clavulanate (AUGMENTIN) 500-125 MG tablet    Sig: Take 1 tablet (500 mg total) by mouth 2 (two) times daily.    Dispense:  14 tablet    Refill:  0    Order Specific Question:   Supervising Provider    Answer:   WRIGHT, PATRICK E [[id number]]  1.  Otitis media right On review of chart, it does appear that patient was prescribed a trial of Augmentin, however unfortunately patient did not receive message.  Will resend prescription.  Patient encouraged to do trial of Augmentin, patient education given on supportive care and red flags for prompt reevaluation.  Patient encouraged to follow-up with ENT as  needed- amoxicillin-clavulanate (AUGMENTIN) 500-125 MG tablet; Take 1 tablet (500 mg total) by mouth 2 (two) times daily.  Dispense: 14 tablet; Refill: 0    I have reviewed the patient's medical history (PMH, PSH, Social History, Family History, Medications, and allergies) , and have been updated if relevant. I spent 20 minutes reviewing chart and  face to face time with patient.   Follow-up: Return if symptoms worsen or fail to improve.    Loraine Grip Mayers, PA-C

## 2021-05-29 ENCOUNTER — Encounter: Payer: Self-pay | Admitting: Physician Assistant

## 2021-06-01 ENCOUNTER — Other Ambulatory Visit: Payer: Self-pay | Admitting: Internal Medicine

## 2021-06-01 DIAGNOSIS — R0981 Nasal congestion: Secondary | ICD-10-CM

## 2021-06-02 NOTE — Telephone Encounter (Signed)
Requested medication (s) are due for refill today - yes  Requested medication (s) are on the active medication list -yes  Future visit scheduled -no  Last refill: 05/09/21  Notes to clinic: Request RF: Rx for acute diagnosis- sent for review to continue   Requested Prescriptions  Pending Prescriptions Disp Refills   loratadine (CLARITIN) 10 MG tablet [Pharmacy Med Name: LORATADINE 10 MG TABLET] 30 tablet 0    Sig: TOME UNA TABLETA TODOS LOS DIAS     Ear, Nose, and Throat:  Antihistamines Passed - 06/01/2021  2:33 PM      Passed - Valid encounter within last 12 months    Recent Outpatient Visits           5 days ago Other acute nonsuppurative otitis media of right ear, recurrence not specified   [place], Cari S, PA-C   3 weeks ago Ear pain, right   [place], Neoma Laming B, MD   4 months ago Type 2 diabetes, controlled, with neuropathy [facility])   [place], Deborah B, MD   1 year ago Type 2 diabetes mellitus without complication, without long-term current use of insulin ([place])   [place] Ladell Pier, MD   1 year ago Acute pain of left knee   [place], MD       Future Appointments             In 2 weeks [place] Sender Governor Rooks, MD Urogynecology at [facility], [facility]   In 1 month Ladell Pier, MD [place]             fluticasone [facility]) 50 MCG/ACT nasal spray [Pharmacy Med Name: FLUTICASONE PROP 50 MCG SPRAY] 16 mL     Sig: PLACE 1 SPRAY INTO BOTH NOSTRILS DAILY AS NEEDED FOR ALLERGIES OR RHINITIS.     Ear, Nose, and Throat: Nasal Preparations - Corticosteroids Passed - 06/01/2021  2:33 PM      Passed - Valid encounter within last 12 months    Recent Outpatient Visits           5 days ago Other acute  nonsuppurative otitis media of right ear, recurrence not specified   [place], Cari S, PA-C   3 weeks ago Ear pain, right   [place] Karle Plumber B, MD   4 months ago Type 2 diabetes, controlled, with neuropathy [facility])   [place], Deborah B, MD   1 year ago Type 2 diabetes mellitus without complication, without long-term current use of insulin ([place])   [place] Ladell Pier, MD   1 year ago Acute pain of left knee   [place], MD       Future Appointments             In 2 weeks Wannetta Sender Governor Rooks, MD Urogynecology at [facility], [facility]   In 1 month Ladell Pier, MD [place]               Requested Prescriptions  Pending Prescriptions Disp Refills   loratadine (CLARITIN) 10 MG tablet [Pharmacy Med Name: LORATADINE 10 MG TABLET] 30  tablet 0    Sig: TOME UNA TABLETA TODOS LOS DIAS     Ear, Nose, and Throat:  Antihistamines Passed - 06/01/2021  2:33 PM      Passed - Valid encounter within last 12 months    Recent Outpatient Visits           5 days ago Other acute nonsuppurative otitis media of right ear, recurrence not specified   [place], Cari S, PA-C   3 weeks ago Ear pain, right   [place], Neoma Laming B, MD   4 months ago Type 2 diabetes, controlled, with neuropathy [facility])   [place], Deborah B, MD   1 year ago Type 2 diabetes mellitus without complication, without long-term current use of insulin ([place])   [place], Deborah B, MD   1 year ago Acute pain of left knee   [place], MD       Future  Appointments             In 2 weeks Wannetta Sender Governor Rooks, MD Urogynecology at [facility], [facility]   In 1 month Ladell Pier, MD [place]             fluticasone [facility]) 50 MCG/ACT nasal spray [Pharmacy Med Name: FLUTICASONE PROP 50 MCG SPRAY] 16 mL     Sig: PLACE 1 SPRAY INTO BOTH NOSTRILS DAILY AS NEEDED FOR ALLERGIES OR RHINITIS.     Ear, Nose, and Throat: Nasal Preparations - Corticosteroids Passed - 06/01/2021  2:33 PM      Passed - Valid encounter within last 12 months    Recent Outpatient Visits           5 days ago Other acute nonsuppurative otitis media of right ear, recurrence not specified   [place], Cari S, PA-C   3 weeks ago Ear pain, right   [place] Karle Plumber B, MD   4 months ago Type 2 diabetes, controlled, with neuropathy [facility])   [place], Deborah B, MD   1 year ago Type 2 diabetes mellitus without complication, without long-term current use of insulin [facility])   [place], MD   1 year ago Acute pain of left knee   [place], MD       Future Appointments             In 2 weeks Wannetta Sender Governor Rooks, MD Urogynecology at [facility], [facility]   In 1 month Ladell Pier, MD [place]

## 2021-06-05 ENCOUNTER — Other Ambulatory Visit: Payer: Self-pay

## 2021-06-05 ENCOUNTER — Ambulatory Visit (INDEPENDENT_AMBULATORY_CARE_PROVIDER_SITE_OTHER): Payer: Medicare HMO | Admitting: Nurse Practitioner

## 2021-06-05 ENCOUNTER — Encounter: Payer: Self-pay | Admitting: Nurse Practitioner

## 2021-06-05 VITALS — BP 133/76 | HR 68 | Temp 98.2°F | Resp 18 | Ht 60.0 in | Wt 145.0 lb

## 2021-06-05 DIAGNOSIS — H9311 Tinnitus, right ear: Secondary | ICD-10-CM

## 2021-06-05 DIAGNOSIS — H9201 Otalgia, right ear: Secondary | ICD-10-CM | POA: Diagnosis not present

## 2021-06-05 MED ORDER — NEOMYCIN-POLYMYXIN-HC 3.5-10000-1 OT SOLN: 3.0000 [drp] | Freq: Three times a day (TID) | OTIC | 0 refills | Status: AC

## 2021-06-05 MED ORDER — PREDNISONE 10 MG PO TABS: 10.0000 mg | ORAL_TABLET | Freq: Every day | ORAL | 0 refills | Status: AC

## 2021-06-05 NOTE — Progress Notes (Signed)
Patient has eaten and taken medication today. Patient reports constant buzzing in her ear. Patient denies pain after completing round of antibiotics. [place] if an insect is causing the sound. Patient was evaluated for same concern on 05/28/21 with no sight of insect being present.

## 2021-06-05 NOTE — Patient Instructions (Addendum)
Right Ear Pain:  Will order ear drops - concerned for possible otitis externa  Will order PT - vestibular rehab  Will order prednisone    Follow up:  Follow up if needed

## 2021-06-05 NOTE — Assessment & Plan Note (Signed)
Will order ear drops - concerned for possible otitis externa  Will order PT - vestibular rehab  Will order prednisone    Follow up:  Follow up if needed

## 2021-06-05 NOTE — Progress Notes (Signed)
@Patient  ID: Jodi [name], female    DOB: May 22, 1946, 75 y.o.   MRN: [id number]  Chief Complaint  Patient presents with   Tinnitus    Buzzing    Referring provider: Ladell Pier, MD  HPI  Patient presents today for right ear pain.  Spanish interpreter was used for this visit.  Patient has seen multiple doctors for this issue.  She was seen on 05/10/2019 today by her PCP and prescribed Claritin and Flonase.  She was referred to ENT and has followed with them.  They state that they see nothing wrong with her ears.  Patient has also followed with her dentist for possible TMJ and her dentist did not think that she had an issue with this.  She has been seen in the mobile clinic as well.  She was also prescribed a round of amoxicillin.  She states that nothing has helped as she is still having ringing buzzing and pain to her right ear. Denies f/c/s, n/v/d, hemoptysis, PND, chest pain or edema.       Allergies  Allergen Reactions   Cortisone     Rash, swelling   Gabapentin Nausea Only    With dizziness   Pork-Derived Products     Rash, joint pain   Shellfish Allergy    Cortisone Rash   Shellfish Allergy Rash and Other (See Comments)    joint pain, GI pain    Immunization History  Administered Date(s) Administered   Influenza,inj,Quad PF,6+ Mos 04/02/2016, 03/16/2017, 04/08/2018, 05/09/2021   PFIZER(Purple Top)SARS-COV-2 Vaccination 09/09/2019, 09/13/2019, 10/02/2019   Pneumococcal Conjugate-13 09/28/2017   Pneumococcal Polysaccharide-23 04/02/2016   Tdap 04/02/2016   Zoster Recombinat (Shingrix) 01/07/2021    Past Medical History:  Diagnosis Date   Anemia    Diabetes mellitus without complication ([place])    Hypertension    Insomnia    Polio    in childhood    Tobacco History: Social History   Tobacco Use  Smoking Status Never  Smokeless Tobacco Never   Counseling given: Not Answered   Outpatient Encounter Medications as of 06/05/2021  Medication Sig    ACCU-CHEK SOFTCLIX LANCETS lancets Use as instructed to test blood glucose twice daily.   alendronate (FOSAMAX) 70 MG tablet Take 1 tablet (70 mg total) by mouth once a week. TAKE 1 TABLET (70 MG TOTAL) BY MOUTH ONCE A WEEK.   amLODipine (NORVASC) 5 MG tablet Take 0.5 tablets (2.5 mg total) by mouth daily.   atorvastatin (LIPITOR) 10 MG tablet Take 1 tablet (10 mg total) by mouth daily.   Blood Glucose Monitoring Suppl (ACCU-CHEK GUIDE ME) w/Device KIT Use as directed   CALCIUM PO Take 1 tablet by mouth daily.   diclofenac Sodium (VOLTAREN) 1 % GEL Apply 4 g topically 4 (four) times daily. Left knee.   dimenhyDRINATE (DRAMAMINE) 50 MG tablet Take 1 tablet (50 mg total) by mouth every 8 (eight) hours as needed.   Elastic Bandages & Supports (LUMBAR BACK BRACE/SUPPORT PAD) MISC 1 application by Does not apply route daily.   ferrous sulfate 325 (65 FE) MG tablet Take 1 tablet (325 mg total) by mouth daily with breakfast.   fluticasone (FLONASE) 50 MCG/ACT nasal spray PLACE 1 SPRAY INTO BOTH NOSTRILS DAILY AS NEEDED FOR ALLERGIES OR RHINITIS.   GEMTESA 75 MG TABS TOME UNA TABLETA TODOS LOS D AS   glucose blood (ACCU-CHEK GUIDE) test strip Use as instructed   hydrochlorothiazide (HYDRODIURIL) 12.5 MG tablet TOME UNA TABLETA TODOS LOS DIAS  Iron-FA-B Cmp-C-Biot-Probiotic (FUSION PLUS) CAPS Take 1 tablet by mouth every other day. As tolerated   Lancets Misc. (ACCU-CHEK FASTCLIX LANCET) KIT Use as directed   loratadine (CLARITIN) 10 MG tablet TOME UNA TABLETA TODOS LOS DIAS   meclizine (ANTIVERT) 12.5 MG tablet Take 1 tablet (12.5 mg total) by mouth daily as needed for dizziness.   metFORMIN (GLUCOPHAGE) 500 MG tablet Take 1 tablet (500 mg total) by mouth daily with breakfast.   methocarbamol (ROBAXIN) 500 MG tablet Take 1 tablet (500 mg total) by mouth 2 (two) times daily.   neomycin-polymyxin-hydrocortisone (CORTISPORIN) OTIC solution Place 3 drops into the right ear 3 (three) times daily for 7  days.   omeprazole (PRILOSEC) 40 MG capsule TOME UNA CAPSULA TODOS LOS DIAS   predniSONE (DELTASONE) 10 MG tablet Take 1 tablet (10 mg total) by mouth daily with breakfast for 5 days.   senna-docusate (SENOKOT-S) 8.6-50 MG tablet Take 1 tablet by mouth at bedtime as needed for mild constipation.   triamcinolone cream (KENALOG) 0.1 % APLIQUE AL AREA AFECTADA DOS VECES AL DIA   Vibegron 75 MG TABS Take 1 tablet by mouth daily.   Vitamin D, Cholecalciferol, 10 MCG (400 UNIT) CHEW Chew 2 tablets (800 Units total) by mouth daily.   [DISCONTINUED] amoxicillin-clavulanate (AUGMENTIN) 500-125 MG tablet Take 1 tablet (500 mg total) by mouth 2 (two) times daily.   No facility-administered encounter medications on file as of 06/05/2021.     Review of Systems  Review of Systems  Constitutional: Negative.   HENT:  Positive for ear pain and tinnitus.   Cardiovascular: Negative.   Gastrointestinal: Negative.   Allergic/Immunologic: Negative.   Neurological: Negative.   Psychiatric/Behavioral: Negative.        Physical Exam  BP 133/76 (BP Location: Left Arm, Patient Position: Sitting, Cuff Size: Normal)   Pulse 68   Temp 98.2 F (36.8 C) (Oral)   Resp 18   Ht 5' (1.524 m)   Wt 145 lb (65.8 kg)   SpO2 97%   BMI 28.32 kg/m   Wt Readings from Last 5 Encounters:  06/05/21 145 lb (65.8 kg)  05/28/21 144 lb (65.3 kg)  05/11/21 147 lb 0.8 oz (66.7 kg)  05/09/21 147 lb (66.7 kg)  05/07/21 143 lb (64.9 kg)     Physical Exam Vitals and nursing note reviewed.  Constitutional:      General: She is not in acute distress.    Appearance: She is well-developed.  HENT:     Right Ear: Tenderness (tenderness noted to ear canal) present. No swelling. Tympanic membrane is not perforated or erythematous.     Left Ear: Tympanic membrane normal.  Cardiovascular:     Rate and Rhythm: Normal rate and regular rhythm.  Pulmonary:     Effort: Pulmonary effort is normal.     Breath sounds: Normal  breath sounds.  Neurological:     Mental Status: She is alert and oriented to person, place, and time.     Lab Results:  CBC    Component Value Date/Time   WBC 6.8 01/07/2021 1554   WBC 4.4 03/25/2018 0912   WBC 5.1 10/15/2017 0820   RBC 4.19 01/07/2021 1554   RBC 4.16 03/25/2018 0912   RBC 4.15 03/25/2018 0912   HGB 12.4 01/07/2021 1554   HCT 38.0 01/07/2021 1554   PLT 404 01/07/2021 1554   MCV 91 01/07/2021 1554   MCH 29.6 01/07/2021 1554   MCH 30.6 03/25/2018 0912   MCHC 32.6 01/07/2021  1554   MCHC 32.6 03/25/2018 0912   RDW 13.4 01/07/2021 1554   LYMPHSABS 1.3 03/25/2018 0912   MONOABS 0.5 03/25/2018 0912   EOSABS 0.2 03/25/2018 0912   BASOSABS 0.1 03/25/2018 0912    BMET    Component Value Date/Time   NA 141 01/07/2021 1554   K 4.5 01/07/2021 1554   CL 102 01/07/2021 1554   CO2 23 01/07/2021 1554   GLUCOSE 108 (H) 01/07/2021 1554   GLUCOSE 108 (H) 03/25/2018 0912   BUN 16 01/07/2021 1554   CREATININE 0.56 (L) 01/07/2021 1554   CREATININE 0.72 03/25/2018 0912   CREATININE 0.66 10/05/2016 1536   CALCIUM 9.9 01/07/2021 1554   GFRNONAA 88 05/12/2019 0907   GFRNONAA >60 03/25/2018 0912   GFRNONAA 89 10/05/2016 1536   GFRAA 101 05/12/2019 0907   GFRAA >60 03/25/2018 0912   GFRAA >89 10/05/2016 1536    BNP No results found for: BNP  ProBNP No results found for: PROBNP  Imaging: No results found.   Assessment & Plan:   Right ear pain Will order ear drops - concerned for possible otitis externa  Will order PT - vestibular rehab  Will order prednisone    Follow up:  Follow up if needed     Fenton Foy, NP 06/05/2021

## 2021-06-06 ENCOUNTER — Other Ambulatory Visit: Payer: Self-pay | Admitting: Internal Medicine

## 2021-06-06 DIAGNOSIS — R0981 Nasal congestion: Secondary | ICD-10-CM

## 2021-06-09 ENCOUNTER — Encounter: Payer: Self-pay | Admitting: Rehabilitative and Restorative Service Providers"

## 2021-06-09 ENCOUNTER — Other Ambulatory Visit: Payer: Self-pay

## 2021-06-09 ENCOUNTER — Ambulatory Visit: Payer: Medicare HMO | Attending: Nurse Practitioner | Admitting: Rehabilitative and Restorative Service Providers"

## 2021-06-09 DIAGNOSIS — R42 Dizziness and giddiness: Secondary | ICD-10-CM | POA: Insufficient documentation

## 2021-06-09 DIAGNOSIS — H9311 Tinnitus, right ear: Secondary | ICD-10-CM | POA: Insufficient documentation

## 2021-06-09 DIAGNOSIS — R293 Abnormal posture: Secondary | ICD-10-CM | POA: Diagnosis not present

## 2021-06-09 DIAGNOSIS — H9201 Otalgia, right ear: Secondary | ICD-10-CM | POA: Diagnosis not present

## 2021-06-09 DIAGNOSIS — M542 Cervicalgia: Secondary | ICD-10-CM | POA: Insufficient documentation

## 2021-06-09 NOTE — Therapy (Signed)
[facility] [address] [place], [place], [address] Phone: [phone]   Fax:  [phone]  Physical Therapy Evaluation  Patient Details  Name: Jodi [name] MRN: [id number] Date of Birth: [birth year] Referring Provider (PT): Lazaro Arms, NP   Encounter Date: 06/09/2021   PT End of Session - 06/09/21 2202     Visit Number 1    Number of Visits 6    Date for PT Re-Evaluation 07/21/21    Authorization Type [employer] Medicare and Medicaid    Progress Note Due on Visit 10    PT Start Time 1410    PT Stop Time 1450    PT Time Calculation (min) 40 min             Past Medical History:  Diagnosis Date   Anemia    Diabetes mellitus without complication ([place])    Hypertension    Insomnia    Polio    in childhood    Past Surgical History:  Procedure Laterality Date   CESAREAN SECTION     SHOULDER SURGERY Left 2010    There were no vitals filed for this visit.    Subjective Assessment - 06/09/21 1413     Subjective The patient reports ear infection in mid October with dizziness and ear pain.  She notes pain to the touch and swelling in the R ear. She notes a few episodes of dizziness when getting up, but currently dizziness is resolved.  She is concerned about ringing in the R ear for the remainder of her life.  The pain/swelling and ringing are her main complaints.   The ENT treated her infection and she was referred to dentist that didn't find any issues with TMJ.    Patient is accompained by: Interpreter    Pertinent History Pt  with hx of GERD, osteoporosis, Vit D def, OAB, diverticulosis, iron def, HTN, HL and DM    Patient Stated Goals reduce ringing in the ears    Currently in Pain? No/denies   None at rest- gentle touch of TMJ/ anterior to ear produces pain.               [facility] PT Assessment - 06/09/21 1419       Assessment   Medical Diagnosis R ear pain, dizziness    Referring Provider (PT) Lazaro Arms, NP    Onset Date/Surgical Date 05/09/21    Hand Dominance Right    Prior Therapy none      Precautions   Precautions None      Restrictions   Weight Bearing Restrictions No      Balance Screen   Has the patient fallen in the past 6 months Yes    How many times? 2-- thinks because she got up quickly    Has the patient had a decrease in activity level because of a fear of falling?  No    Is the patient reluctant to leave their home because of a fear of falling?  No      Home Environment   Living Environment Private residence    [place] Access Level entry    [place] One level      Prior Function   Level of Independence Independent    Vocation Retired      Observation/Other Assessments   Focus on Therapeutic Outcomes ([place])  n/a-- not set up      Sensation  Light Touch Appears Intact      ROM / Strength   AROM / PROM / Strength AROM      AROM   Overall AROM  Deficits    Overall AROM Comments Notes some R mastoid and jaw pain with L lateral leaning (places R cervical musculature on stretch).  She notes pain with end range rotation to the L side.      Flexibility   Soft Tissue Assessment /Muscle Length --      Palpation   Spinal mobility hypomobility with CPA and UPA mid to low c-spine    Palpation comment tender to palpation R SCM, scalenes, suboccipitals                    Vestibular Assessment - 06/09/21 1424       Vestibular Assessment   General Observation Patient walks into clinic without device indep      Symptom Behavior   Subjective history of current problem Sudden onset of ear pain and dizziness    Type of Dizziness  --   no current dizziness, continues iwht pain   Frequency of Dizziness none    Duration of Dizziness none    Aggravating Factors --   ringing in the ears is constant, worse in quiet space   Relieving Factors No known relieving factors    Progression of Symptoms No change since onset     History of similar episodes dizziness improved, but pain in R ear and tinnitus remain      Oculomotor Exam   Oculomotor Alignment Normal    Ocular ROM WFLs    Spontaneous Absent    Gaze-induced  Absent    Smooth Pursuits Intact   notes some difficulty with visual tracking   Saccades Intact      Vestibulo-Ocular Reflex   VOR 1 Head Only (x 1 viewing) notes head /neck popping    VOR Cancellation Normal    Comment head impulse test=postiive to the R and patient begins to guard through her neck making testing challenging.  She notes some difficulty with VOR x 1 faster pace      Positional Testing   Sidelying Test Sidelying Right;Sidelying Left    Horizontal Canal Testing Horizontal Canal Right;Horizontal Canal Left      Sidelying Right   Sidelying Right Duration none    Sidelying Right Symptoms No nystagmus      Sidelying Left   Sidelying Left Duration none into L SL, but lightheaded into sitting    Sidelying Left Symptoms No nystagmus      Horizontal Canal Right   Horizontal Canal Right Duration none    Horizontal Canal Right Symptoms Normal      Horizontal Canal Left   Horizontal Canal Left Duration none    Horizontal Canal Left Symptoms Normal                Objective measurements completed on examination: See above findings.       [facility] Adult PT Treatment/Exercise - 06/09/21 1450       Exercises   Exercises Neck      Neck Exercises: Seated   Shoulder Rolls 10 reps;Backwards      Neck Exercises: Stretches   Upper Trapezius Stretch Right;Left;2 reps;20 seconds    Levator Stretch Right;Left;2 reps;20 seconds                     PT Education - 06/09/21 2201     Education Details  educated re: what PT can offer as our focus is typically on dizziness; discussed goals of therapy and HEP    Person(s) Educated Patient    Methods Explanation;Demonstration;Handout    Comprehension Verbalized understanding;Returned demonstration               PT Short Term Goals - 06/09/21 2203       PT SHORT TERM GOAL #1   Title The patient will be indep with HEP for neck stretching and postural strengthening.    Time 3    Period Weeks    Target Date 06/30/21               PT Long Term Goals - 06/09/21 2203       PT LONG TERM GOAL #1   Title The patient will report pain in R side of face/jaw reduced by 50% with palpation.    Time 6    Period Weeks    Target Date 07/21/21      PT LONG TERM GOAL #2   Title The patient will verbalize understanding of positions for device use to reduce forward head positioning.    Time 6    Period Weeks    Target Date 07/21/21      PT LONG TERM GOAL #3   Title The patient will demo exercises for balance and motion sensitivity as indicated.    Time 6    Period Weeks    Target Date 07/21/21                    Plan - 06/09/21 2205     Clinical Impression Statement The patient is a 75 yo female presenting to OP PT with >1 month h/o R ear pain and tinnitus.  At onset, she describes dizziness that is mostly resolved.  She presents today with tightness in cervical musculature, hypomobility C-spine, abnormal posture with forward head, pain with palpation R cervical musculature (suboccipitals, scalenes, SCM), pain with palpation R mastoid and TMJ, and constant tinnitus.  PT initiated HEP focused on stretching/lengthening and mobilization parascapular musculature.  Plan to progress to patient tolerance.    Personal Factors and Comorbidities Comorbidity 1    Comorbidities DM    Stability/Clinical Decision Making Stable/Uncomplicated    Clinical Decision Making Low    Rehab Potential Good    PT Frequency 1x / week    PT Duration 6 weeks    PT Treatment/Interventions Neuromuscular re-education;Therapeutic exercise;Therapeutic activities;Patient/family education;ADLs/Self Care Home Management;Vestibular;Manual techniques;Moist Heat;Traction;Functional mobility training;Gait training    PT Next  Visit Plan Check HEP, postural strengthening, VOR x 1 gaze adaptation, standing balance    Consulted and Agree with Plan of Care Patient             Patient will benefit from skilled therapeutic intervention in order to improve the following deficits and impairments:  Decreased balance, Decreased activity tolerance, Hypomobility, Postural dysfunction, Impaired flexibility, Pain, Increased fascial restricitons, Dizziness  Visit Diagnosis: Dizziness and giddiness  Cervicalgia  Abnormal posture     Problem List Patient Active Problem List   Diagnosis Date Noted   Right ear pain 06/05/2021   Mild major depression, single episode ([place]) 01/15/2020   Overweight 01/15/2020   Overactive bladder 01/15/2020   Hyperlipidemia 05/13/2019   Age-related osteoporosis without current pathological fracture 03/08/2019   Chronic pain 01/01/2019   Medication refill 01/01/2019   Symptomatic anemia    Iron deficiency anemia due to chronic blood loss 10/14/2017   HTN (hypertension), benign  03/16/2017   Primary insomnia 03/16/2017   Osteoporosis 01/08/2017   GERD (gastroesophageal reflux disease) 01/08/2017   Vitamin D deficiency 01/08/2017   Edema 03/13/2014    Kristina Bertone, PT 06/09/2021, 10:14 PM  [facility] [address] [place], [place], [address] Phone: [phone]   Fax:  [phone]  Name: Jodi [name] MRN: [id number] Date of Birth: [birth year]

## 2021-06-09 NOTE — Patient Instructions (Addendum)
Access Code: [email or web address] URL: [email or web address] Date: 06/09/2021 Prepared by: Rudell Cobb  Exercises Sternocleidomastoid Stretch - 2 x daily - 7 x weekly - 1 sets - 3 reps - 15 seconds hold Gentle Levator Scapulae Stretch (Mirrored) - 2 x daily - 7 x weekly - 1 sets - 3 reps - 15 seconds hold Seated Shoulder Shrug Circles AROM Backward - 2 x daily - 7 x weekly - 1 sets - 10 reps

## 2021-06-16 NOTE — Progress Notes (Signed)
[facility] Return Visit  SUBJECTIVE  History of Present Illness: Jodi [name] is a 75 y.o. female seen in follow-up for overactive bladder. Plan at last visit was to start Gemtesa 75mg . She had previously been on Myrbetriq without improvement.   She does not have any urgency or leakage of urine. Does not have to run to the bathroom.   Past Medical History: Patient  has a past medical history of Anemia, Diabetes mellitus without complication ([place]), Hypertension, Insomnia, and Polio.   Past Surgical History: She  has a past surgical history that includes Cesarean section and Shoulder surgery (Left, 2010).   Medications: She has a current medication list which includes the following prescription(s): accu-chek softclix lancets, alendronate, amlodipine, atorvastatin, accu-chek guide me, calcium, diclofenac sodium, dimenhydrinate, lumbar back brace/support pad, ferrous sulfate, fluticasone, gemtesa, accu-chek guide, hydrochlorothiazide, fusion plus, accu-chek fastclix lancet, loratadine, meclizine, metformin, methocarbamol, omeprazole, senna-docusate, triamcinolone cream, vibegron, and vitamin d (cholecalciferol).   Allergies: Patient is allergic to cortisone, gabapentin, pork-derived products, shellfish allergy, cortisone, and shellfish allergy.   Social History: Patient  reports that she has never smoked. She has never used smokeless tobacco. She reports that she does not currently use alcohol. She reports that she does not use drugs.      OBJECTIVE     Physical Exam: Vitals:   06/17/21 1308  BP: 125/81  Pulse: 90  Weight: 145 lb (65.8 kg)   Gen: No apparent distress, A&O x 3.  Detailed Urogynecologic Evaluation:  Deferred.    ASSESSMENT AND PLAN    Jodi [name] is a 75 y.o. with:  1. Overactive bladder    - continue with Gemtesa 75mg  - Will have her follow up in 6 months or sooner if needed  Jaquita Folds, MD   Time spent: I spent 15  minutes dedicated to the care of this patient on the date of this encounter to include pre-visit review of records, face-to-face time with the patient and post visit documentation.

## 2021-06-17 ENCOUNTER — Ambulatory Visit (INDEPENDENT_AMBULATORY_CARE_PROVIDER_SITE_OTHER): Payer: Medicare HMO | Admitting: Obstetrics and Gynecology

## 2021-06-17 ENCOUNTER — Encounter: Payer: Self-pay | Admitting: Obstetrics and Gynecology

## 2021-06-17 ENCOUNTER — Other Ambulatory Visit: Payer: Self-pay

## 2021-06-17 VITALS — BP 125/81 | HR 90 | Wt 145.0 lb

## 2021-06-17 DIAGNOSIS — N3281 Overactive bladder: Secondary | ICD-10-CM

## 2021-06-19 ENCOUNTER — Other Ambulatory Visit: Payer: Self-pay

## 2021-06-19 ENCOUNTER — Ambulatory Visit: Payer: Medicare HMO | Attending: Nurse Practitioner | Admitting: Rehabilitative and Restorative Service Providers"

## 2021-06-19 DIAGNOSIS — R42 Dizziness and giddiness: Secondary | ICD-10-CM | POA: Diagnosis not present

## 2021-06-19 DIAGNOSIS — R293 Abnormal posture: Secondary | ICD-10-CM | POA: Insufficient documentation

## 2021-06-19 DIAGNOSIS — M542 Cervicalgia: Secondary | ICD-10-CM | POA: Diagnosis not present

## 2021-06-19 NOTE — Patient Instructions (Addendum)
Access Code: [email or web address] URL: [email or web address] Date: 06/19/2021 Prepared by: Rudell Cobb  Exercises Sternocleidomastoid Stretch - 2 x daily - 7 x weekly - 1 sets - 3 reps - 15 seconds hold Gentle Levator Scapulae Stretch (Mirrored) - 2 x daily - 7 x weekly - 1 sets - 3 reps - 15 seconds hold Seated Shoulder Shrug Circles AROM Backward - 2 x daily - 7 x weekly - 1 sets - 10 reps Shoulder External Rotation and Scapular Retraction with Resistance - 2 x daily - 7 x weekly - 1 sets - 10 reps Seated Shoulder Horizontal Abduction with Resistance - 2 x daily - 7 x weekly - 1 sets - 10 reps

## 2021-06-19 NOTE — Therapy (Signed)
[facility] [address] [place], [place], [address] Phone: [phone]   Fax:  [phone]  Physical Therapy Treatment  Patient Details  Name: Jodi [name] MRN: [id number] Date of Birth: [birth year] Referring Provider (PT): Lazaro Arms, NP   Encounter Date: 06/19/2021   PT End of Session - 06/19/21 1111     Visit Number 2    Number of Visits 6    Date for PT Re-Evaluation 07/21/21    Authorization Type [employer] Medicare and Medicaid    Progress Note Due on Visit 10    PT Start Time 1105    PT Stop Time 1145    PT Time Calculation (min) 40 min             Past Medical History:  Diagnosis Date   Anemia    Diabetes mellitus without complication ([employer])    Hypertension    Insomnia    Polio    in childhood    Past Surgical History:  Procedure Laterality Date   CESAREAN SECTION     SHOULDER SURGERY Left 2010    There were no vitals filed for this visit.   Subjective Assessment - 06/19/21 1109     Subjective The patient continues with itching in ear canal and ringing in the ear.  She feels less tension in her neck with HEP.    Patient is accompained by: Interpreter    Pertinent History Pt  with hx of GERD, osteoporosis, Vit D def, OAB, diverticulosis, iron def, HTN, HL and DM    Patient Stated Goals reduce ringing in the ears    Currently in Pain? No/denies                [facility] PT Assessment - 06/19/21 1114       Assessment   Medical Diagnosis R ear pain, dizziness    Referring Provider (PT) Lazaro Arms, NP    Onset Date/Surgical Date 05/09/21                           [facility] Adult PT Treatment/Exercise - 06/19/21 1126       Neck Exercises: Theraband   Other Theraband Exercises seated red band W x 12 reps    Other Theraband Exercises seated red band T x 12 reps      Manual Therapy   Manual Therapy Soft tissue mobilization;Myofascial release;Manual Traction    Manual  therapy comments skilled palpation to assess response to STM and DN    Soft tissue mobilization STM bilat suboccipitals, upper trap, and scalenes    Myofascial Release suboccipitals    Manual Traction supine manual traction              Trigger Point Dry Needling - 06/19/21 1749     Consent Given? Yes    Education Handout Provided Yes   interpreter reviewed with patient   Muscles Treated Head and Neck Upper trapezius;Suboccipitals;Cervical multifidi    Upper Trapezius Response Twitch reponse elicited;Palpable increased muscle length    Suboccipitals Response Palpable increased muscle length    Cervical multifidi Response Palpable increased muscle length                   PT Education - 06/19/21 1746     Education Details HEP updated    Person(s) Educated Patient    Methods Explanation;Demonstration;Handout    Comprehension Verbalized understanding;Returned demonstration  PT Short Term Goals - 06/09/21 2203       PT SHORT TERM GOAL #1   Title The patient will be indep with HEP for neck stretching and postural strengthening.    Time 3    Period Weeks    Target Date 06/30/21               PT Long Term Goals - 06/09/21 2203       PT LONG TERM GOAL #1   Title The patient will report pain in R side of face/jaw reduced by 50% with palpation.    Time 6    Period Weeks    Target Date 07/21/21      PT LONG TERM GOAL #2   Title The patient will verbalize understanding of positions for device use to reduce forward head positioning.    Time 6    Period Weeks    Target Date 07/21/21      PT LONG TERM GOAL #3   Title The patient will demo exercises for balance and motion sensitivity as indicated.    Time 6    Period Weeks    Target Date 07/21/21                   Plan - 06/19/21 1746     Clinical Impression Statement The patient reports no change with HEP. She has significant cervical tightness and discomfort with palpation.   PT utilized dry needling today to reduce myofascial banding and patient tolerated well.  PT also added HEP for postural strengthening.  Plan to continue progressing and assessing response to treatment.    Comorbidities DM    Stability/Clinical Decision Making Stable/Uncomplicated    Rehab Potential Good    PT Frequency 1x / week    PT Duration 6 weeks    PT Treatment/Interventions Neuromuscular re-education;Therapeutic exercise;Therapeutic activities;Patient/family education;ADLs/Self Care Home Management;Vestibular;Manual techniques;Moist Heat;Traction;Functional mobility training;Gait training;Dry needling    PT Next Visit Plan Check HEP, postural strengthening, VOR x 1 gaze adaptation, standing balance    Consulted and Agree with Plan of Care Patient             Patient will benefit from skilled therapeutic intervention in order to improve the following deficits and impairments:  Decreased balance, Decreased activity tolerance, Hypomobility, Postural dysfunction, Impaired flexibility, Pain, Increased fascial restricitons, Dizziness  Visit Diagnosis: Dizziness and giddiness  Cervicalgia  Abnormal posture     Problem List Patient Active Problem List   Diagnosis Date Noted   Right ear pain 06/05/2021   Mild major depression, single episode ([place]) 01/15/2020   Overweight 01/15/2020   Overactive bladder 01/15/2020   Hyperlipidemia 05/13/2019   Age-related osteoporosis without current pathological fracture 03/08/2019   Chronic pain 01/01/2019   Medication refill 01/01/2019   Symptomatic anemia    Iron deficiency anemia due to chronic blood loss 10/14/2017   HTN (hypertension), benign 03/16/2017   Primary insomnia 03/16/2017   Osteoporosis 01/08/2017   GERD (gastroesophageal reflux disease) 01/08/2017   Vitamin D deficiency 01/08/2017   Edema 03/13/2014    Jodi [name], PT 06/19/2021, 5:50 PM  [facility] [address],  [place], [place], [address] Phone: [phone]   Fax:  [phone]  Name: Jodi [name] MRN: [id number] Date of Birth: February 22, 1946

## 2021-06-24 NOTE — Addendum Note (Signed)
Addended by: Rudell Cobb M on: 06/24/2021 09:41 PM   Modules accepted: Orders

## 2021-06-26 ENCOUNTER — Ambulatory Visit: Payer: Medicare HMO | Admitting: Rehabilitative and Restorative Service Providers"

## 2021-06-26 ENCOUNTER — Other Ambulatory Visit: Payer: Self-pay

## 2021-06-26 DIAGNOSIS — R293 Abnormal posture: Secondary | ICD-10-CM

## 2021-06-26 DIAGNOSIS — R42 Dizziness and giddiness: Secondary | ICD-10-CM

## 2021-06-26 DIAGNOSIS — M542 Cervicalgia: Secondary | ICD-10-CM

## 2021-06-26 NOTE — Therapy (Signed)
[facility] [address], [place], [place], [address] Phone: [phone]   Fax:  [phone]  Physical Therapy Treatment  Patient Details  Name: [name] MRN: [id number] Date of Birth: 1946-03-23 Referring Provider (PT): Lazaro Arms, NP   Encounter Date: 06/26/2021   PT End of Session - 06/26/21 0945     Visit Number 3    Number of Visits 6    Date for PT Re-Evaluation 07/21/21    Authorization Type [employer] Medicare and Medicaid    Progress Note Due on Visit 10    PT Start Time 0940    PT Stop Time 1020    PT Time Calculation (min) 40 min             Past Medical History:  Diagnosis Date   Anemia    Diabetes mellitus without complication ([place])    Hypertension    Insomnia    Polio    in childhood    Past Surgical History:  Procedure Laterality Date   CESAREAN SECTION     SHOULDER SURGERY Left 2010    There were no vitals filed for this visit.   Subjective Assessment - 06/26/21 0944     Subjective The patient reports the dry needling helped reduce neck tightness and she feels less tension/more relaxed.  The ringing in her ears has not yet changed.    Pertinent History Pt  with hx of GERD, osteoporosis, Vit D def, OAB, diverticulosis, iron def, HTN, HL and DM    Patient Stated Goals reduce ringing in the ears    Currently in Pain? No/denies                [facility] PT Assessment - 06/26/21 0945       Assessment   Medical Diagnosis R ear pain, dizziness    Referring Provider (PT) Lazaro Arms, NP    Onset Date/Surgical Date 05/09/21                           [facility] Adult PT Treatment/Exercise - 06/26/21 0945       Self-Care   Self-Care Other Self-Care Comments    Other Self-Care Comments  discussed positioning when using electronic devices (avoiding prolonged neck flexed position, discussed keeping spine in neutral with sleeping positions      Manual Therapy   Manual  Therapy Joint mobilization;Soft tissue mobilization    Manual therapy comments skilled palpation to assess response to STM and DN    Joint Mobilization grade II c-spine mid and low CPA mobs    Soft tissue mobilization STM bilat subocciitals, R scalenes, R SCM, bilateral Upper trap, bilateral cervical paraspinals.  Also performed temporalis and masseter soft tissue mobilization    Myofascial Release suboccipitals and temporalis              Trigger Point Dry Needling - 06/26/21 0001     Consent Given? Yes    Education Handout Provided Previously provided    Muscles Treated Head and Neck Semispinalis capitus;Upper trapezius;Levator scapulae;Cervical multifidi    Upper Trapezius Response Twitch reponse elicited;Palpable increased muscle length    Levator Scapulae Response Twitch response elicited;Palpable increased muscle length    Semispinalis capitus Response Twitch reponse elicited;Palpable increased muscle length    Cervical multifidi Response Palpable increased muscle length  PT Short Term Goals - 06/09/21 2203       PT SHORT TERM GOAL #1   Title The patient will be indep with HEP for neck stretching and postural strengthening.    Time 3    Period Weeks    Target Date 06/30/21               PT Long Term Goals - 06/09/21 2203       PT LONG TERM GOAL #1   Title The patient will report pain in R side of face/jaw reduced by 50% with palpation.    Time 6    Period Weeks    Target Date 07/21/21      PT LONG TERM GOAL #2   Title The patient will verbalize understanding of positions for device use to reduce forward head positioning.    Time 6    Period Weeks    Target Date 07/21/21      PT LONG TERM GOAL #3   Title The patient will demo exercises for balance and motion sensitivity as indicated.    Time 6    Period Weeks    Target Date 07/21/21                   Plan - 06/26/21 1038     Clinical Impression Statement The  patient notes improving neck tension, however continues to c/o constant tinnitus.  She is seeing ENT tomorrow. The patient had significant pain with palpation of the temporalis musculature prior to DN in the neck.  This improved in both temporalis and masseters with cervical DN.  PT to have patient continue to work on exercise and self mgmt through positioning and will f/u next week.    PT Treatment/Interventions Neuromuscular re-education;Therapeutic exercise;Therapeutic activities;Patient/family education;ADLs/Self Care Home Management;Vestibular;Manual techniques;Moist Heat;Traction;Functional mobility training;Gait training;Dry needling    PT Next Visit Plan postural tightness, DN for temporalis and masseter if needed    Consulted and Agree with Plan of Care Patient             Patient will benefit from skilled therapeutic intervention in order to improve the following deficits and impairments:     Visit Diagnosis: Dizziness and giddiness  Cervicalgia  Abnormal posture     Problem List Patient Active Problem List   Diagnosis Date Noted   Right ear pain 06/05/2021   Mild major depression, single episode ([place]) 01/15/2020   Overweight 01/15/2020   Overactive bladder 01/15/2020   Hyperlipidemia 05/13/2019   Age-related osteoporosis without current pathological fracture 03/08/2019   Chronic pain 01/01/2019   Medication refill 01/01/2019   Symptomatic anemia    Iron deficiency anemia due to chronic blood loss 10/14/2017   HTN (hypertension), benign 03/16/2017   Primary insomnia 03/16/2017   Osteoporosis 01/08/2017   GERD (gastroesophageal reflux disease) 01/08/2017   Vitamin D deficiency 01/08/2017   Edema 03/13/2014    Cameren Odwyer, PT 06/26/2021, 10:42 AM  [facility] [address], [place], [place], [address] Phone: [phone]   Fax:  [phone]  Name: Jodi [name] MRN: [id number] Date of Birth:  10/20/1945

## 2021-06-27 DIAGNOSIS — H903 Sensorineural hearing loss, bilateral: Secondary | ICD-10-CM | POA: Diagnosis not present

## 2021-06-30 ENCOUNTER — Other Ambulatory Visit: Payer: Self-pay | Admitting: Internal Medicine

## 2021-06-30 DIAGNOSIS — E1140 Type 2 diabetes mellitus with diabetic neuropathy, unspecified: Secondary | ICD-10-CM

## 2021-06-30 NOTE — Telephone Encounter (Signed)
Requested Prescriptions  Pending Prescriptions Disp Refills  . metFORMIN (GLUCOPHAGE) 500 MG tablet [Pharmacy Med Name: METFORMIN HCL 500 MG TABLET] 90 tablet 0    Sig: TOME UNA TABLETA TODOS LOS DIAS CON EL [place]     Endocrinology:  Diabetes - Biguanides Failed - 06/30/2021  1:36 AM      Failed - Cr in normal range and within 360 days    Creatinine  Date Value Ref Range Status  03/25/2018 0.72 0.44 - 1.00 mg/dL Final   Creat  Date Value Ref Range Status  10/05/2016 0.66 0.60 - 0.93 mg/dL Final    Comment:      For patients > or = 77 years of age: The upper reference limit for Creatinine is approximately 13% higher for people identified as African-American.      Creatinine, Ser  Date Value Ref Range Status  01/07/2021 0.56 (L) 0.57 - 1.00 mg/dL Final         Passed - HBA1C is between 0 and 7.9 and within 180 days    HbA1c, POC (controlled diabetic range)  Date Value Ref Range Status  01/07/2021 5.8 0.0 - 7.0 % Final         Passed - eGFR in normal range and within 360 days    GFR, Est African American  Date Value Ref Range Status  10/05/2016 >89 >=60 mL/min Final   GFR, Est AFR Am  Date Value Ref Range Status  03/25/2018 >60 >60 mL/min Final    Comment:    (NOTE) The eGFR has been calculated using the CKD EPI equation. This calculation has not been validated in all clinical situations. eGFR's persistently <60 mL/min signify possible Chronic Kidney Disease.    GFR calc Af Amer  Date Value Ref Range Status  05/12/2019 101 >59 mL/min/1.73 Final   GFR, Est Non African American  Date Value Ref Range Status  10/05/2016 89 >=60 mL/min Final   GFR, Estimated  Date Value Ref Range Status  03/25/2018 >60 >60 mL/min Final   GFR calc non Af Amer  Date Value Ref Range Status  05/12/2019 88 >59 mL/min/1.73 Final   eGFR  Date Value Ref Range Status  01/07/2021 94 >59 mL/min/1.73 Final         Passed - Valid encounter within last 6 months    Recent  Outpatient Visits          1 month ago Other acute nonsuppurative otitis media of right ear, recurrence not specified   [place], PA-C   1 month ago Ear pain, right   [place], Neoma Laming B, MD   5 months ago Type 2 diabetes, controlled, with neuropathy [facility])   [place], MD   1 year ago Type 2 diabetes mellitus without complication, without long-term current use of insulin ([place])   [place], MD   1 year ago Acute pain of left knee   [place], MD      Future Appointments            In 2 weeks Ladell Pier, MD [place]   In 5 months Wannetta Sender, Governor Rooks, MD Urogynecology at [facility]

## 2021-07-04 ENCOUNTER — Other Ambulatory Visit: Payer: Self-pay

## 2021-07-04 ENCOUNTER — Ambulatory Visit: Payer: Medicare HMO | Admitting: Rehabilitative and Restorative Service Providers"

## 2021-07-04 DIAGNOSIS — R293 Abnormal posture: Secondary | ICD-10-CM

## 2021-07-04 DIAGNOSIS — R42 Dizziness and giddiness: Secondary | ICD-10-CM

## 2021-07-04 DIAGNOSIS — M542 Cervicalgia: Secondary | ICD-10-CM

## 2021-07-04 NOTE — Therapy (Addendum)
[facility] [address], [place], [place], [address] Phone: [phone]   Fax:  [phone]  Physical Therapy Treatment/On Hold/ Discharge Summary  Patient Details  Name: Jodi [name] MRN: [id number] Date of Birth: 1946/01/16 Referring Provider (PT): Lazaro Arms, NP   Encounter Date: 07/04/2021   PT End of Session - 07/04/21 1143     Visit Number 4    Number of Visits 6    Date for PT Re-Evaluation 07/21/21    Authorization Type [employer] Medicare and Medicaid    Progress Note Due on Visit 10    PT Start Time 1022    PT Stop Time 1055    PT Time Calculation (min) 33 min    Activity Tolerance Patient tolerated treatment well    Behavior During Therapy WFL for tasks assessed/performed             Past Medical History:  Diagnosis Date   Anemia    Diabetes mellitus without complication ([place])    Hypertension    Insomnia    Polio    in childhood    Past Surgical History:  Procedure Laterality Date   CESAREAN SECTION     SHOULDER SURGERY Left 2010    There were no vitals filed for this visit.   Subjective Assessment - 07/04/21 1021     Subjective The patient feels less neck tension.  Last Friday she did the hearing study and found deficiency in hearing and tinnitus.  [place] is going to [place] to [place] about getting some hearing aides.    Pertinent History Pt  with hx of GERD, osteoporosis, Vit D def, OAB, diverticulosis, iron def, HTN, HL and DM    Patient Stated Goals reduce ringing in the ears    Currently in Pain? No/denies                               [facility] Adult PT Treatment/Exercise - 07/04/21 1148       Exercises   Exercises Neck      Neck Exercises: Theraband   Scapula Retraction 10 reps    Scapula Retraction Limitations yellow band    Horizontal ABduction 10 reps    Horizontal ABduction Limitations yellow band      Neck Exercises: Stretches   Upper Trapezius Stretch  Right;Left;2 reps;20 seconds    Levator Stretch Right;Left;2 reps;20 seconds      Manual Therapy   Manual Therapy Soft tissue mobilization;Myofascial release    Manual therapy comments skilled palpation to assess response to STM and DN    Soft tissue mobilization STM bilat subocciitals, R scalenes, R SCM, bilateral Upper trap, bilateral cervical paraspinals.  Also performed temporalis and masseter soft tissue mobilization    Myofascial Release suboccipitals and temporalis           Exercises performed in front of mirror for visual cues.   Trigger Point Dry Needling - 07/04/21 1027     Consent Given? Yes    Education Handout Provided Previously provided    Muscles Treated Head and Neck Masseter;Upper trapezius;Levator scapulae;Cervical multifidi    Upper Trapezius Response Twitch reponse elicited;Palpable increased muscle length    Levator Scapulae Response Twitch response elicited;Palpable increased muscle length    Masseter Response Twitch reponse elicited;Palpable increased muscle length    Semispinalis capitus Response Palpable increased muscle length    Cervical multifidi Response Palpable increased muscle length  PT Short Term Goals - 07/04/21 1024       PT SHORT TERM GOAL #1   Title The patient will be indep with HEP for neck stretching and postural strengthening.    Time 3    Period Weeks    Status Achieved    Target Date 06/30/21               PT Long Term Goals - 07/04/21 1024       PT LONG TERM GOAL #1   Title The patient will report pain in R side of face/jaw reduced by 50% with palpation.    Baseline UPDATE:  No pain at rest, but she continues with some tenderness with self palpation.    Time 6    Period Weeks    Target Date 07/21/21      PT LONG TERM GOAL #2   Title The patient will verbalize understanding of positions for device use to reduce forward head positioning.    Time 6    Period Weeks    Status Achieved     Target Date 07/21/21      PT LONG TERM GOAL #3   Title The patient will demo exercises for balance and motion sensitivity as indicated.    Baseline No further issues with balance or motion sensitivity.    Time 6    Period Weeks    Status Deferred    Target Date 07/21/21                   Plan - 07/04/21 1149     Clinical Impression Statement The patient notes continuing improvement with posture and muscle tension.  She continues with tinnitus and is seeing specialist next week in [facility] (after her hearing test results from last week).  She plans to continue working on HEP and contacting PT in next 2 weeks if she has further issues/questions re: HEP.    PT Treatment/Interventions Neuromuscular re-education;Therapeutic exercise;Therapeutic activities;Patient/family education;ADLs/Self Care Home Management;Vestibular;Manual techniques;Moist Heat;Traction;Functional mobility training;Gait training;Dry needling    PT Next Visit Plan on hold to f/u re: tinnitus and hearing with provider next week, PT to f/u if patient feels she has further questions/needs.    Consulted and Agree with Plan of Care Patient             Patient will benefit from skilled therapeutic intervention in order to improve the following deficits and impairments:     Visit Diagnosis: Dizziness and giddiness  Cervicalgia  Abnormal posture     Problem List Patient Active Problem List   Diagnosis Date Noted   Right ear pain 06/05/2021   Mild major depression, single episode ([place]) 01/15/2020   Overweight 01/15/2020   Overactive bladder 01/15/2020   Hyperlipidemia 05/13/2019   Age-related osteoporosis without current pathological fracture 03/08/2019   Chronic pain 01/01/2019   Medication refill 01/01/2019   Symptomatic anemia    Iron deficiency anemia due to chronic blood loss 10/14/2017   HTN (hypertension), benign 03/16/2017   Primary insomnia 03/16/2017   Osteoporosis 01/08/2017   GERD  (gastroesophageal reflux disease) 01/08/2017   Vitamin D deficiency 01/08/2017   Edema 03/13/2014    PHYSICAL THERAPY DISCHARGE SUMMARY  Visits from Start of Care: 4  Current functional level related to goals / functional outcomes: See above   Remaining deficits: See above    Education / Equipment: HEP provided   Patient agrees to discharge. Patient goals were partially met. Patient is being discharged due to  meeting the stated rehab goals.   Lennon, PT 07/04/2021, 11:55 AM  [facility] [address] [place], [place], [address] Phone: [phone]   Fax:  [phone]  Name: LABRINA LINES MRN: [id number] Date of Birth: 12-09-1945

## 2021-07-07 ENCOUNTER — Other Ambulatory Visit: Payer: Self-pay | Admitting: Internal Medicine

## 2021-07-07 DIAGNOSIS — I10 Essential (primary) hypertension: Secondary | ICD-10-CM

## 2021-07-08 ENCOUNTER — Ambulatory Visit (INDEPENDENT_AMBULATORY_CARE_PROVIDER_SITE_OTHER): Payer: Medicare HMO | Admitting: Internal Medicine

## 2021-07-08 ENCOUNTER — Encounter: Payer: Self-pay | Admitting: Internal Medicine

## 2021-07-08 VITALS — BP 126/72 | HR 88 | Temp 98.1°F | Ht 60.0 in | Wt 143.5 lb

## 2021-07-08 DIAGNOSIS — Z23 Encounter for immunization: Secondary | ICD-10-CM | POA: Diagnosis not present

## 2021-07-08 DIAGNOSIS — H9311 Tinnitus, right ear: Secondary | ICD-10-CM

## 2021-07-08 DIAGNOSIS — E7800 Pure hypercholesterolemia, unspecified: Secondary | ICD-10-CM

## 2021-07-08 DIAGNOSIS — E559 Vitamin D deficiency, unspecified: Secondary | ICD-10-CM | POA: Diagnosis not present

## 2021-07-08 DIAGNOSIS — M8000XD Age-related osteoporosis with current pathological fracture, unspecified site, subsequent encounter for fracture with routine healing: Secondary | ICD-10-CM

## 2021-07-08 DIAGNOSIS — M810 Age-related osteoporosis without current pathological fracture: Secondary | ICD-10-CM | POA: Diagnosis not present

## 2021-07-08 DIAGNOSIS — I10 Essential (primary) hypertension: Secondary | ICD-10-CM

## 2021-07-08 NOTE — Progress Notes (Signed)
New Patient Office Visit     This visit occurred during the SARS-CoV-2 public health emergency.  Safety protocols were in place, including screening questions prior to the visit, additional usage of staff PPE, and extensive cleaning of exam room while observing appropriate contact time as indicated for disinfecting solutions.    CC/Reason for Visit: Establish care, discuss chronic conditions, discuss tinnitus Previous PCP: Karle Plumber, MD Last Visit: Earlier this year  HPI: Jodi [name] is a 75 y.o. female who is coming in today for the above mentioned reasons. Past Medical History is significant for: Hypertension, hyperlipidemia, osteoporosis, impaired glucose tolerance, vitamin D deficiency.  She is here to establish care as she would prefer to have a physician that speaks [place].  She is originally from [place].  She does not smoke, she does not drink, she has no known drug allergies, her past surgical history is only significant for 2 C-sections, her family history significant for coronary artery disease in both parents who are now deceased.  She is requesting her second shingles vaccine today.  She had her physical over the summer.  She has been dealing with tinnitus since September.  She has already seen ENT.   Past Medical/Surgical History: Past Medical History:  Diagnosis Date   Anemia    Diabetes mellitus without complication ([place])    Hypertension    Insomnia    Polio    in childhood    Past Surgical History:  Procedure Laterality Date   CESAREAN SECTION     SHOULDER SURGERY Left 2010    Social History:  reports that she has never smoked. She has never used smokeless tobacco. She reports that she does not currently use alcohol. She reports that she does not use drugs.  Allergies: Allergies  Allergen Reactions   Cortisone     Rash, swelling   Gabapentin Nausea Only    With dizziness   Pork-Derived Products     Rash, joint pain   Shellfish Allergy     Cortisone Rash   Shellfish Allergy Rash and Other (See Comments)    joint pain, GI pain    Family History:  Family History  Family history unknown: Yes     Current Outpatient Medications:    ACCU-CHEK SOFTCLIX LANCETS lancets, Use as instructed to test blood glucose twice daily., Disp: 100 each, Rfl: 12   alendronate (FOSAMAX) 70 MG tablet, Take 1 tablet (70 mg total) by mouth once a week. TAKE 1 TABLET (70 MG TOTAL) BY MOUTH ONCE A WEEK., Disp: 13 tablet, Rfl: 3   amLODipine (NORVASC) 5 MG tablet, Take 0.5 tablets (2.5 mg total) by mouth daily., Disp: 45 tablet, Rfl: 3   atorvastatin (LIPITOR) 10 MG tablet, Take 1 tablet (10 mg total) by mouth daily., Disp: 90 tablet, Rfl: 2   Blood Glucose Monitoring Suppl (ACCU-CHEK GUIDE ME) w/Device KIT, Use as directed, Disp: 1 kit, Rfl: 0   CALCIUM PO, Take 1 tablet by mouth daily., Disp: , Rfl:    diclofenac Sodium (VOLTAREN) 1 % GEL, Apply 4 g topically 4 (four) times daily. Left knee., Disp: 100 g, Rfl: 6   dimenhyDRINATE (DRAMAMINE) 50 MG tablet, Take 1 tablet (50 mg total) by mouth every 8 (eight) hours as needed., Disp: 30 tablet, Rfl: 0   Elastic Bandages & Supports (LUMBAR BACK BRACE/SUPPORT PAD) MISC, 1 application by Does not apply route daily., Disp: 1 each, Rfl: 0   ferrous sulfate 325 (65 FE) MG tablet, Take 1 tablet (  325 mg total) by mouth daily with breakfast., Disp: 90 tablet, Rfl: 1   fluticasone (FLONASE) 50 MCG/ACT nasal spray, PLACE 1 SPRAY INTO BOTH NOSTRILS DAILY AS NEEDED FOR ALLERGIES OR RHINITIS., Disp: 48 mL, Rfl: 1   GEMTESA 75 MG TABS, TOME UNA TABLETA TODOS LOS D AS, Disp: , Rfl:    glucose blood (ACCU-CHEK GUIDE) test strip, Use as instructed, Disp: 100 each, Rfl: 12   hydrochlorothiazide (HYDRODIURIL) 12.5 MG tablet, TOME UNA TABLETA TODOS LOS DIAS, Disp: 90 tablet, Rfl: 1   Iron-FA-B Cmp-C-Biot-Probiotic (FUSION PLUS) CAPS, Take 1 tablet by mouth every other day. As tolerated, Disp: 90 capsule, Rfl: 2   Lancets Misc.  (ACCU-CHEK FASTCLIX LANCET) KIT, Use as directed, Disp: 1 kit, Rfl: 6   loratadine (CLARITIN) 10 MG tablet, TOME UNA TABLETA TODOS LOS DIAS, Disp: 90 tablet, Rfl: 1   meclizine (ANTIVERT) 12.5 MG tablet, Take 1 tablet (12.5 mg total) by mouth daily as needed for dizziness., Disp: 30 tablet, Rfl: 0   metFORMIN (GLUCOPHAGE) 500 MG tablet, TOME UNA TABLETA TODOS LOS DIAS CON EL [place], Disp: 90 tablet, Rfl: 0   methocarbamol (ROBAXIN) 500 MG tablet, Take 1 tablet (500 mg total) by mouth 2 (two) times daily., Disp: 30 tablet, Rfl: 0   omeprazole (PRILOSEC) 40 MG capsule, TOME UNA CAPSULA TODOS LOS DIAS, Disp: 90 capsule, Rfl: 2   senna-docusate (SENOKOT-S) 8.6-50 MG tablet, Take 1 tablet by mouth at bedtime as needed for mild constipation., Disp: 120 tablet, Rfl: 3   triamcinolone cream (KENALOG) 0.1 %, APLIQUE AL AREA AFECTADA DOS VECES AL DIA, Disp: 30 g, Rfl: 1   Vibegron 75 MG TABS, Take 1 tablet by mouth daily., Disp: 30 tablet, Rfl: 5   Vitamin D, Cholecalciferol, 10 MCG (400 UNIT) CHEW, Chew 2 tablets (800 Units total) by mouth daily., Disp: 60 tablet, Rfl: 6  Review of Systems:  Constitutional: Denies fever, chills, diaphoresis, appetite change and fatigue.  HEENT: Denies photophobia, eye pain, redness,  ear pain, congestion, sore throat, rhinorrhea, sneezing, mouth sores, trouble swallowing, neck pain, neck stiffness and tinnitus.   Respiratory: Denies SOB, DOE, cough, chest tightness,  and wheezing.   Cardiovascular: Denies chest pain, palpitations and leg swelling.  Gastrointestinal: Denies nausea, vomiting, abdominal pain, diarrhea, constipation, blood in stool and abdominal distention.  Genitourinary: Denies dysuria, urgency, frequency, hematuria, flank pain and difficulty urinating.  Endocrine: Denies: hot or cold intolerance, sweats, changes in hair or nails, polyuria, polydipsia. Musculoskeletal: Denies myalgias, back pain, joint swelling, arthralgias and gait problem.  Skin: Denies  pallor, rash and wound.  Neurological: Denies dizziness, seizures, syncope, weakness, light-headedness, numbness and headaches.  Hematological: Denies adenopathy. Easy bruising, personal or family bleeding history  Psychiatric/Behavioral: Denies suicidal ideation, mood changes, confusion, nervousness, sleep disturbance and agitation    Physical Exam: Vitals:   07/08/21 1431  BP: 126/72  Pulse: 88  Temp: 98.1 F (36.7 C)  TempSrc: Oral  SpO2: 98%  Weight: 143 lb 8 oz (65.1 kg)  Height: 5' (1.524 m)   Body mass index is 28.03 kg/m.  Constitutional: NAD, calm, comfortable Eyes: PERRL, lids and conjunctivae normal ENMT: Mucous membranes are moist. Tympanic membrane is pearly white, no erythema or bulging. Respiratory: clear to auscultation bilaterally, no wheezing, no crackles. Normal respiratory effort. No accessory muscle use.  Cardiovascular: Regular rate and rhythm, no murmurs / rubs / gallops. No extremity edema.  Neurologic: Grossly intact and nonfocal Psychiatric: Normal judgment and insight. Alert and oriented x 3. Normal mood.  Impression and Plan:  Need for shingles vaccine  - Plan: Varicella-zoster vaccine IM (Shingrix)  Tinnitus of right ear -We have discussed that this is unfortunately a chronic condition, she has been told she needs hearing aids, this may help tinnitus.  We have discussed that there is some mild evidence that may suggest improvement with B complex vitamins.  Given that she has already seen ENT, I do not think any further work-up would be useful at this point.  Pure hypercholesterolemia -On atorvastatin 10 mg.  Last lipid panel over the summer with a total cholesterol of 229, triglycerides 181 and LDL 131.  Recheck when she returns for CPE.  Age-related osteoporosis without current pathological fracture -On alendronate, check DEXA when she returns for CPE.  HTN (hypertension), benign -Well-controlled.  Vitamin D deficiency -Check levels  when she returns for CPE.  Time spent: 46 minutes reviewing chart, interviewing and examining patient and formulating plan of care.     Lelon Frohlich, MD [facility]

## 2021-07-18 ENCOUNTER — Ambulatory Visit: Payer: Medicare HMO | Admitting: Internal Medicine

## 2021-07-25 ENCOUNTER — Other Ambulatory Visit: Payer: Self-pay

## 2021-07-25 ENCOUNTER — Ambulatory Visit (HOSPITAL_COMMUNITY)
Admission: EM | Admit: 2021-07-25 | Discharge: 2021-07-25 | Disposition: A | Discharge status: Home / Self Care | Payer: Medicare HMO | Attending: Family Medicine | Admitting: Family Medicine

## 2021-07-25 DIAGNOSIS — J019 Acute sinusitis, unspecified: Secondary | ICD-10-CM | POA: Diagnosis not present

## 2021-07-25 DIAGNOSIS — H9201 Otalgia, right ear: Secondary | ICD-10-CM

## 2021-07-25 DIAGNOSIS — R051 Acute cough: Secondary | ICD-10-CM

## 2021-07-25 MED ORDER — AMOXICILLIN-POT CLAVULANATE 875-125 MG PO TABS: 1.0000 | ORAL_TABLET | Freq: Two times a day (BID) | ORAL | 0 refills | Status: AC

## 2021-07-25 NOTE — ED Provider Notes (Signed)
[facility]    CSN: [id number] Arrival date & time: 07/25/21  1002      History   Chief Complaint Chief Complaint  Patient presents with   Otalgia   Cough    HPI Jodi [name] is a 76 y.o. female.   Interpreter used today;  Patient is here for not feeling well;  Since oct 29th she has been having cold/cough//fevers;  used dayquil/nyquil without much help;  She conts with cough, chest pain from cough, right ear pain.  She is just not feeling any better;  sinus congestion, drainage.  + sinus pain, pressure;    Last night her temp was over 100.  This morning she took motrin as a result;   She has seen ent in the past;  dx with tinnitus; still with ear pain.    Past Medical History:  Diagnosis Date   Anemia    Diabetes mellitus without complication ([place])    Hypertension    Insomnia    Polio    in childhood    Patient Active Problem List   Diagnosis Date Noted   Right ear pain 06/05/2021   Mild major depression, single episode ([place]) 01/15/2020   Overweight 01/15/2020   Overactive bladder 01/15/2020   Hyperlipidemia 05/13/2019   Age-related osteoporosis without current pathological fracture 03/08/2019   Chronic pain 01/01/2019   Medication refill 01/01/2019   Symptomatic anemia    Iron deficiency anemia due to chronic blood loss 10/14/2017   HTN (hypertension), benign 03/16/2017   Primary insomnia 03/16/2017   Osteoporosis 01/08/2017   GERD (gastroesophageal reflux disease) 01/08/2017   Vitamin D deficiency 01/08/2017   Edema 03/13/2014    Past Surgical History:  Procedure Laterality Date   CESAREAN SECTION     SHOULDER SURGERY Left 2010    OB History   No obstetric history on file.      Home Medications    Prior to Admission medications   Medication Sig Start Date End Date Taking? Authorizing Provider  ACCU-CHEK SOFTCLIX LANCETS lancets Use as instructed to test blood glucose twice daily. 07/15/16   Maren Reamer, MD   alendronate (FOSAMAX) 70 MG tablet Take 1 tablet (70 mg total) by mouth once a week. TAKE 1 TABLET (70 MG TOTAL) BY MOUTH ONCE A WEEK. 02/06/21   Shamleffer, Melanie Crazier, MD  amLODipine (NORVASC) 5 MG tablet Take 0.5 tablets (2.5 mg total) by mouth daily. 01/07/21   Ladell Pier, MD  atorvastatin (LIPITOR) 10 MG tablet Take 1 tablet (10 mg total) by mouth daily. 01/07/21   Ladell Pier, MD  Blood Glucose Monitoring Suppl (ACCU-CHEK GUIDE ME) w/Device KIT Use as directed 01/15/20   Ladell Pier, MD  CALCIUM PO Take 1 tablet by mouth daily.    [provider]  diclofenac Sodium (VOLTAREN) 1 % GEL Apply 4 g topically 4 (four) times daily. Left knee. 01/07/21   Ladell Pier, MD  dimenhyDRINATE (DRAMAMINE) 50 MG tablet Take 1 tablet (50 mg total) by mouth every 8 (eight) hours as needed. 05/13/21   Hans Eden, NP  Elastic Bandages & Supports (LUMBAR BACK BRACE/SUPPORT PAD) MISC 1 application by Does not apply route daily. 04/02/16   Maren Reamer, MD  ferrous sulfate 325 (65 FE) MG tablet Take 1 tablet (325 mg total) by mouth daily with breakfast. 01/07/21   Ladell Pier, MD  fluticasone (FLONASE) 50 MCG/ACT nasal spray PLACE 1 SPRAY INTO BOTH NOSTRILS DAILY AS NEEDED FOR  ALLERGIES OR RHINITIS. 06/06/21   Ladell Pier, MD  GEMTESA 75 MG TABS TOME UNA TABLETA TODOS LOS D AS 05/08/21   [provider]  glucose blood (ACCU-CHEK GUIDE) test strip Use as instructed 01/15/20   Ladell Pier, MD  hydrochlorothiazide (HYDRODIURIL) 12.5 MG tablet TOME UNA TABLETA TODOS LOS DIAS 07/07/21   Ladell Pier, MD  Iron-FA-B Cmp-C-Biot-Probiotic (FUSION PLUS) CAPS Take 1 tablet by mouth every other day. As tolerated 10/10/16   Langeland, Dawn T, MD  Lancets Misc. (ACCU-CHEK FASTCLIX LANCET) KIT Use as directed 01/15/20   Ladell Pier, MD  loratadine (CLARITIN) 10 MG tablet TOME UNA TABLETA TODOS LOS DIAS 06/06/21   Ladell Pier, MD   meclizine (ANTIVERT) 12.5 MG tablet Take 1 tablet (12.5 mg total) by mouth daily as needed for dizziness. 05/09/21   Ladell Pier, MD  metFORMIN (GLUCOPHAGE) 500 MG tablet TOME UNA TABLETA TODOS LOS DIAS CON EL DESAYUNO 06/30/21   Ladell Pier, MD  methocarbamol (ROBAXIN) 500 MG tablet Take 1 tablet (500 mg total) by mouth 2 (two) times daily. 03/06/20   Jaynee Eagles, PA-C  omeprazole (PRILOSEC) 40 MG capsule TOME UNA CAPSULA TODOS LOS DIAS 05/06/21   Ladell Pier, MD  senna-docusate (SENOKOT-S) 8.6-50 MG tablet Take 1 tablet by mouth at bedtime as needed for mild constipation. 10/07/16   Maren Reamer, MD  triamcinolone cream (KENALOG) 0.1 % APLIQUE AL AREA AFECTADA DOS VECES AL DIA 05/16/21   Ladell Pier, MD  Vibegron 75 MG TABS Take 1 tablet by mouth daily. 05/07/21   Jaquita Folds, MD  Vitamin D, Cholecalciferol, 10 MCG (400 UNIT) CHEW Chew 2 tablets (800 Units total) by mouth daily. 09/15/19   Ladell Pier, MD    Family History Family History  Family history unknown: Yes    Social History Social History   Tobacco Use   Smoking status: Never   Smokeless tobacco: Never  Vaping Use   Vaping Use: Never used  Substance Use Topics   Alcohol use: Not Currently   Drug use: Never     Allergies   Cortisone, Gabapentin, Pork-derived products, Shellfish allergy, Cortisone, and Shellfish allergy   Review of Systems Review of Systems  Constitutional:  Positive for fatigue and fever. Negative for chills.  HENT:  Positive for ear pain, sinus pressure and sinus pain.   Eyes: Negative.   Respiratory:  Positive for cough. Negative for shortness of breath and wheezing.   Cardiovascular: Negative.   Gastrointestinal: Negative.   Genitourinary: Negative.     Physical Exam Triage Vital Signs ED Triage Vitals  Enc Vitals Group     BP 07/25/21 1119 136/74     Pulse Rate 07/25/21 1119 72     Resp 07/25/21 1119 18     Temp 07/25/21 1119 98.5 F  (36.9 C)     Temp Source 07/25/21 1119 Oral     SpO2 07/25/21 1119 96 %     Weight --      Height --      Head Circumference --      Peak Flow --      Pain Score 07/25/21 1117 6     Pain Loc --      Pain Edu? --      Excl. in [place]? --    No data found.  Updated Vital Signs BP 136/74 (BP Location: Right Arm)    Pulse 72    Temp 98.5 F (  36.9 C) (Oral)    Resp 18    SpO2 96%   Visual Acuity Right Eye Distance:   Left Eye Distance:   Bilateral Distance:    Right Eye Near:   Left Eye Near:    Bilateral Near:     Physical Exam Constitutional:      Appearance: Normal appearance.  HENT:     Head: Normocephalic and atraumatic.     Right Ear: Tympanic membrane and ear canal normal.     Left Ear: Tympanic membrane and ear canal normal.     Nose:     Right Sinus: Maxillary sinus tenderness and frontal sinus tenderness present.     Left Sinus: Maxillary sinus tenderness and frontal sinus tenderness present.     Mouth/Throat:     Mouth: Mucous membranes are moist.     Pharynx: Oropharynx is clear. No posterior oropharyngeal erythema.  Cardiovascular:     Rate and Rhythm: Normal rate and regular rhythm.  Pulmonary:     Effort: Pulmonary effort is normal.     Breath sounds: Normal breath sounds.  Musculoskeletal:     Cervical back: Normal range of motion. Tenderness present.  Lymphadenopathy:     Cervical: Cervical adenopathy present.  Neurological:     Mental Status: She is alert.     UC Treatments / Results  Labs (all labs ordered are listed, but only abnormal results are displayed) Labs Reviewed - No data to display  EKG   Radiology No results found.  Procedures Procedures (including critical care time)  Medications Ordered in UC Medications - No data to display  Initial Impression / Assessment and Plan / UC Course  I have reviewed the triage vital signs and the nursing notes.  Pertinent labs & imaging results that were available during my care of the  patient were reviewed by me and considered in my medical decision making (see chart for details).  Patient here with over 2 months of sinus congestion, cough, and ear pain. Diagnosed with sinus infection and given augmentin.  I do recommend she follow up with her pcp if not improving, or if she has continued right ear pain.    Final Clinical Impressions(s) / UC Diagnoses   Final diagnoses:  Right ear pain  Acute non-recurrent sinusitis, unspecified location  Acute cough     Discharge Instructions      You were diagnosed with a sinus infection today.  I have sent out augmentin orally twice/day x 10 days;  Please take this with food to avoid an upset stomach.  This is likely also causing your cough and ear pain.  I do recommend you follow up with your primary care physician if your ear pain is not improving.     ED Prescriptions     Medication Sig Dispense Auth. Provider   amoxicillin-clavulanate (AUGMENTIN) 875-125 MG tablet Take 1 tablet by mouth every 12 (twelve) hours for 10 days. 20 tablet Rondel Oh, MD      PDMP not reviewed this encounter.   Rondel Oh, MD 07/25/21 1150

## 2021-07-25 NOTE — ED Triage Notes (Signed)
Pt c/o congestion, cough, right ear pain since 12/29. Pt taking Dayquil and Nyquil  Pt was told last month (at ENT) that had hearing loss and tinnitus but pain doesn't go away.

## 2021-07-25 NOTE — Discharge Instructions (Signed)
You were diagnosed with a sinus infection today.  I have sent out augmentin orally twice/day x 10 days;  Please take this with food to avoid an upset stomach.  This is likely also causing your cough and ear pain.  I do recommend you follow up with your primary care physician if your ear pain is not improving.

## 2021-07-29 ENCOUNTER — Other Ambulatory Visit: Payer: Self-pay | Admitting: Internal Medicine

## 2021-07-29 DIAGNOSIS — Z8639 Personal history of other endocrine, nutritional and metabolic disease: Secondary | ICD-10-CM

## 2021-07-29 NOTE — Telephone Encounter (Signed)
Requested medication (s) are due for refill today: yes  Requested medication (s) are on the active medication list: yes  Last refill:  04/23/21  Future visit scheduled: no  Notes to clinic:  Dr. Jerilee Hoh listed as PCP, Dr. Wynetta Emery prescriber of this med, iron and ferritin labs are from 03/25/2018, some labs ordered by Dr. Wynetta Emery 05/09/21 but not drawn, please assess.  Requested Prescriptions  Pending Prescriptions Disp Refills   ferrous sulfate 325 (65 FE) MG tablet [Pharmacy Med Name: FERROUS SULFATE 325 MG TABLET] 90 tablet 1    Sig: TOME UNA TABLETA TODOS LOS DIAS CON EL [place]     Endocrinology:  Minerals - Iron Supplementation Failed - 07/29/2021  1:40 AM      Failed - Fe (serum) in normal range and within 360 days    Iron  Date Value Ref Range Status  03/25/2018 70 41 - 142 ug/dL Final   %SAT  Date Value Ref Range Status  04/28/2016 79 (H) 11 - 50 % Final   Saturation Ratios  Date Value Ref Range Status  03/25/2018 25 21 - 57 % Final          Failed - Ferritin in normal range and within 360 days    Ferritin  Date Value Ref Range Status  03/25/2018 135 11 - 307 ng/mL Final    Comment:    Performed at [facility], [place] [address] [place], [place] [address]          Passed - HGB in normal range and within 360 days    Hemoglobin  Date Value Ref Range Status  01/07/2021 12.4 11.1 - 15.9 g/dL Final          Passed - HCT in normal range and within 360 days    Hematocrit  Date Value Ref Range Status  01/07/2021 38.0 34.0 - 46.6 % Final          Passed - RBC in normal range and within 360 days    RBC  Date Value Ref Range Status  01/07/2021 4.19 3.77 - 5.28 x10E6/uL Final  03/25/2018 4.16 3.70 - 5.45 MIL/uL Final   RBC.  Date Value Ref Range Status  03/25/2018 4.15 3.70 - 5.45 MIL/uL Final          Passed - Valid encounter within last 12 months    Recent Outpatient Visits           2 months ago Other acute nonsuppurative  otitis media of right ear, recurrence not specified   [place], PA-C   2 months ago Ear pain, right   [place] Karle Plumber B, MD   6 months ago Type 2 diabetes, controlled, with neuropathy [facility])   [place], Deborah B, MD   1 year ago Type 2 diabetes mellitus without complication, without long-term current use of insulin [facility])   [place], MD   1 year ago Acute pain of left knee   [place], MD       Future Appointments             In 4 months Wannetta Sender, Governor Rooks, MD Urogynecology at [facility], [facility]   In 5 months Isaac Bliss, Rayford Halsted, MD [employer] at [place], [facility]

## 2021-07-31 DIAGNOSIS — H9113 Presbycusis, bilateral: Secondary | ICD-10-CM | POA: Diagnosis not present

## 2021-07-31 DIAGNOSIS — H9313 Tinnitus, bilateral: Secondary | ICD-10-CM | POA: Diagnosis not present

## 2021-08-01 ENCOUNTER — Other Ambulatory Visit: Payer: Self-pay | Admitting: Nurse Practitioner

## 2021-08-04 ENCOUNTER — Other Ambulatory Visit: Payer: Self-pay | Admitting: Internal Medicine

## 2021-08-04 DIAGNOSIS — M1712 Unilateral primary osteoarthritis, left knee: Secondary | ICD-10-CM

## 2021-08-04 NOTE — Telephone Encounter (Signed)
Requested Prescriptions  Pending Prescriptions Disp Refills   diclofenac Sodium (VOLTAREN) 1 % GEL [Pharmacy Med Name: DICLOFENAC SODIUM 1% GEL] 100 g 6    Sig: APPLY 4 G TOPICALLY 4 (FOUR) TIMES DAILY. LEFT KNEE.     Analgesics:  Topicals Passed - 08/04/2021  1:37 AM      Passed - Valid encounter within last 12 months    Recent Outpatient Visits          2 months ago Other acute nonsuppurative otitis media of right ear, recurrence not specified   [place], PA-C   2 months ago Ear pain, right   [place] Karle Plumber B, MD   6 months ago Type 2 diabetes, controlled, with neuropathy [facility])   [place], Deborah B, MD   1 year ago Type 2 diabetes mellitus without complication, without long-term current use of insulin [facility])   [place], MD   1 year ago Acute pain of left knee   [place], Deborah B, MD      Future Appointments            In 4 months Wannetta Sender, Governor Rooks, MD Urogynecology at [facility], [facility]   In 5 months Isaac Bliss, Rayford Halsted, MD [employer] at [place], [facility]

## 2021-08-15 ENCOUNTER — Telehealth: Payer: Self-pay | Admitting: Internal Medicine

## 2021-08-15 NOTE — Telephone Encounter (Signed)
Patient stopped by office because [facility] cannot get in touch with her Dr.Michelle Wannetta Sender to give the authorization for Vibegron 75 MG TABS  Patient said [facility] cannot fill [facility] without an authorization and she wanted to know it Dr.Hernandez would send in a temporary refill or prescription for her. I did let patient know, via interpreter, that I would send the message back and ask, but she may need contact Dr.Schroeder since she is the authorizing provider and that Dr.Hernandez was out today.    Please advise

## 2021-08-18 ENCOUNTER — Other Ambulatory Visit: Payer: Self-pay | Admitting: Internal Medicine

## 2021-08-18 DIAGNOSIS — N3281 Overactive bladder: Secondary | ICD-10-CM

## 2021-08-18 MED ORDER — VIBEGRON 75 MG PO TABS: 1.0000 | ORAL_TABLET | Freq: Every day | ORAL | 5 refills | Status: DC

## 2021-09-15 ENCOUNTER — Other Ambulatory Visit: Payer: Self-pay | Admitting: Internal Medicine

## 2021-09-15 DIAGNOSIS — R42 Dizziness and giddiness: Secondary | ICD-10-CM

## 2021-09-25 ENCOUNTER — Other Ambulatory Visit: Payer: Self-pay | Admitting: Internal Medicine

## 2021-09-25 DIAGNOSIS — E1140 Type 2 diabetes mellitus with diabetic neuropathy, unspecified: Secondary | ICD-10-CM

## 2021-09-25 DIAGNOSIS — E785 Hyperlipidemia, unspecified: Secondary | ICD-10-CM

## 2021-09-25 NOTE — Telephone Encounter (Signed)
Not in this practice. ?Requested Prescriptions  ?Pending Prescriptions Disp Refills  ?? atorvastatin (LIPITOR) 10 MG tablet [Pharmacy Med Name: ATORVASTATIN 10 MG TABLET] 90 tablet 2  ?  Sig: TOME UNA TABLETA TODOS LOS DIAS  ?  ? Cardiovascular:  Antilipid - Statins Failed - 09/25/2021  1:54 AM  ?  ?  Failed - Lipid Panel in normal range within the last 12 months  ?  Cholesterol, Total  ?Date Value Ref Range Status  ?01/07/2021 229 (H) 100 - 199 mg/dL Final  ? ?LDL Chol Calc (NIH)  ?Date Value Ref Range Status  ?01/07/2021 131 (H) 0 - 99 mg/dL Final  ? ?HDL  ?Date Value Ref Range Status  ?01/07/2021 66 >39 mg/dL Final  ? ?Triglycerides  ?Date Value Ref Range Status  ?01/07/2021 181 (H) 0 - 149 mg/dL Final  ? ?  ?  ?  Passed - Patient is not pregnant  ?  ?  Passed - Valid encounter within last 12 months  ?  Recent Outpatient Visits   ?      ? 3 months ago Right ear pain  ? Primary Care at [facility], Kriste Basque, NP  ? 4 months ago Other acute nonsuppurative otitis media of right ear, recurrence not specified  ? [place], [place]  ? 4 months ago Ear pain, right  ? [place] Plumber B, MD  ? 8 months ago Type 2 diabetes, controlled, with neuropathy ([place])  ? [place] Ladell Pier, MD  ? 1 year ago Type 2 diabetes mellitus without complication, without long-term current use of insulin ([place])  ? [place] Ladell Pier, MD  ?  ?  ?Future Appointments   ?        ? In 2 months Wannetta Sender, Governor Rooks, MD Urogynecology at [facility], [facility]  ? In 3 months Isaac Bliss, Rayford Halsted, MD [place] at [place], [place]  ?  ? ?  ?  ?  ?? metFORMIN (GLUCOPHAGE) 500 MG tablet [Pharmacy Med Name: METFORMIN HCL 500 MG TABLET] 90 tablet 0  ?  Sig: TOME UNA TABLETA TODOS LOS [place]  ?  ? Endocrinology:  Diabetes - Biguanides Failed -  09/25/2021  1:54 AM  ?  ?  Failed - Cr in normal range and within 360 days  ?  Creatinine  ?Date Value Ref Range Status  ?03/25/2018 0.72 0.44 - 1.00 mg/dL Final  ? ?Creat  ?Date Value Ref Range Status  ?10/05/2016 0.66 0.60 - 0.93 mg/dL Final  ?  Comment:  ?    ?For patients > or = 78 years of age: The upper reference limit for ?Creatinine is approximately 13% higher for people identified as ?African-American. ?  ?  ? ?Creatinine, Ser  ?Date Value Ref Range Status  ?01/07/2021 0.56 (L) 0.57 - 1.00 mg/dL Final  ?   ?  ?  Failed - HBA1C is between 0 and 7.9 and within 180 days  ?  HbA1c, POC (controlled diabetic range)  ?Date Value Ref Range Status  ?01/07/2021 5.8 0.0 - 7.0 % Final  ?   ?  ?  Failed - B12 Level in normal range and within 720 days  ?  Vitamin B-12  ?Date Value Ref Range Status  ?02/13/2021 998 (H) 211 - 911 pg/mL Final  ?   ?  ?  Failed - CBC within normal limits and completed in the last 12 months  ?  WBC  ?Date Value Ref Range Status  ?01/07/2021 6.8 3.4 - 10.8 x10E3/uL Final  ?10/15/2017 5.1 4.0 - 10.5 K/uL Final  ? ?WBC Count  ?Date Value Ref Range Status  ?03/25/2018 4.4 3.9 - 10.3 K/uL Final  ? ?RBC  ?Date Value Ref Range Status  ?01/07/2021 4.19 3.77 - 5.28 x10E6/uL Final  ?03/25/2018 4.16 3.70 - 5.45 MIL/uL Final  ? ?RBC.  ?Date Value Ref Range Status  ?03/25/2018 4.15 3.70 - 5.45 MIL/uL Final  ? ?Hemoglobin  ?Date Value Ref Range Status  ?01/07/2021 12.4 11.1 - 15.9 g/dL Final  ? ?Hematocrit  ?Date Value Ref Range Status  ?01/07/2021 38.0 34.0 - 46.6 % Final  ? ?MCHC  ?Date Value Ref Range Status  ?01/07/2021 32.6 31.5 - 35.7 g/dL Final  ?03/25/2018 32.6 31.5 - 36.0 g/dL Final  ? ?MCH  ?Date Value Ref Range Status  ?01/07/2021 29.6 26.6 - 33.0 pg Final  ?03/25/2018 30.6 25.1 - 34.0 pg Final  ? ?MCV  ?Date Value Ref Range Status  ?01/07/2021 91 79 - 97 fL Final  ? ?No results found for: PLTCOUNTKUC, LABPLAT, [place] ?RDW  ?Date Value Ref Range Status  ?01/07/2021 13.4 11.7 - 15.4 % Final  ? ?  ?   ?  Passed - eGFR in normal range and within 360 days  ?  GFR, Est African American  ?Date Value Ref Range Status  ?10/05/2016 >89 >=60 mL/min Final  ? ?GFR, Est AFR Am  ?Date Value Ref Range Status  ?03/25/2018 >60 >60 mL/min Final  ?  Comment:  ?  (NOTE) ?The eGFR has been calculated using the CKD EPI equation. ?This calculation has not been validated in all clinical situations. ?eGFR's persistently <60 mL/min signify possible Chronic Kidney ?Disease. ?  ? ?GFR calc Af Amer  ?Date Value Ref Range Status  ?05/12/2019 101 >59 mL/min/1.73 Final  ? ?GFR, Est Non African American  ?Date Value Ref Range Status  ?10/05/2016 89 >=60 mL/min Final  ? ?GFR, Estimated  ?Date Value Ref Range Status  ?03/25/2018 >60 >60 mL/min Final  ? ?GFR calc non Af Amer  ?Date Value Ref Range Status  ?05/12/2019 88 >59 mL/min/1.73 Final  ? ?eGFR  ?Date Value Ref Range Status  ?01/07/2021 94 >59 mL/min/1.73 Final  ?   ?  ?  Passed - Valid encounter within last 6 months  ?  Recent Outpatient Visits   ?      ? 3 months ago Right ear pain  ? Primary Care at [facility], Kriste Basque, NP  ? 4 months ago Other acute nonsuppurative otitis media of right ear, recurrence not specified  ? [place], [place]  ? 4 months ago Ear pain, right  ? [place] Karle Plumber B, MD  ? 8 months ago Type 2 diabetes, controlled, with neuropathy ([place])  ? [place] Ladell Pier, MD  ? 1 year ago Type 2 diabetes mellitus without complication, without long-term current use of insulin ([place])  ? [place] Ladell Pier, MD  ?  ?  ?Future Appointments   ?        ? In 2 months Wannetta Sender, Governor Rooks, MD Urogynecology at [facility], [facility]  ? In 3 months Isaac Bliss, Rayford Halsted, MD [employer]  at [place], [place]  ?  ? ?  ?  ?  ? ? ?

## 2021-11-27 DIAGNOSIS — R399 Unspecified symptoms and signs involving the genitourinary system: Secondary | ICD-10-CM | POA: Diagnosis not present

## 2021-12-09 ENCOUNTER — Other Ambulatory Visit: Payer: Self-pay | Admitting: Internal Medicine

## 2021-12-09 DIAGNOSIS — Z8639 Personal history of other endocrine, nutritional and metabolic disease: Secondary | ICD-10-CM

## 2021-12-10 NOTE — Telephone Encounter (Signed)
Requested medication (s) are due for refill today: Yes  Requested medication (s) are on the active medication list: Yes  Last refill:  01/07/21  Future visit scheduled: No  Notes to clinic:  Looks like pt. Is now seen at [facility].    Requested Prescriptions  Pending Prescriptions Disp Refills   ferrous sulfate 325 (65 FE) MG tablet [Pharmacy Med Name: FERROUS SULFATE 325 MG TABLET] 90 tablet 1    Sig: TOME UNA TABLETA TODOS LOS DIAS CON EL [place]     Endocrinology:  Minerals - Iron Supplementation Failed - 12/09/2021  2:29 PM      Failed - Fe (serum) in normal range and within 360 days    Iron  Date Value Ref Range Status  03/25/2018 70 41 - 142 ug/dL Final   %SAT  Date Value Ref Range Status  04/28/2016 79 (H) 11 - 50 % Final   Saturation Ratios  Date Value Ref Range Status  03/25/2018 25 21 - 57 % Final         Failed - Ferritin in normal range and within 360 days    Ferritin  Date Value Ref Range Status  03/25/2018 135 11 - 307 ng/mL Final    Comment:    Performed at [facility], [place] [address], [place], [place] [address]         Passed - HGB in normal range and within 360 days    Hemoglobin  Date Value Ref Range Status  01/07/2021 12.4 11.1 - 15.9 g/dL Final         Passed - HCT in normal range and within 360 days    Hematocrit  Date Value Ref Range Status  01/07/2021 38.0 34.0 - 46.6 % Final         Passed - RBC in normal range and within 360 days    RBC  Date Value Ref Range Status  01/07/2021 4.19 3.77 - 5.28 x10E6/uL Final  03/25/2018 4.16 3.70 - 5.45 MIL/uL Final   RBC.  Date Value Ref Range Status  03/25/2018 4.15 3.70 - 5.45 MIL/uL Final         Passed - Valid encounter within last 12 months    Recent Outpatient Visits           6 months ago Right ear pain   Primary Care at [facility], Kriste Basque, NP   6 months ago Other acute nonsuppurative otitis media of right ear, recurrence not specified   [place], [place]   7 months ago Ear pain, right   [place] Karle Plumber B, MD   11 months ago Type 2 diabetes, controlled, with neuropathy [facility])   [place], Deborah B, MD   1 year ago Type 2 diabetes mellitus without complication, without long-term current use of insulin [facility])   [place], MD       Future Appointments             In 6 days Wannetta Sender Governor Rooks, MD Urogynecology at [facility], [facility]   In 1 month Isaac Bliss, Rayford Halsted, MD [employer] at [place], [facility]

## 2021-12-16 ENCOUNTER — Encounter: Payer: Self-pay | Admitting: Obstetrics and Gynecology

## 2021-12-16 ENCOUNTER — Ambulatory Visit (INDEPENDENT_AMBULATORY_CARE_PROVIDER_SITE_OTHER): Payer: Medicare HMO | Admitting: Obstetrics and Gynecology

## 2021-12-16 VITALS — BP 129/83 | HR 99 | Temp 98.0°F | Resp 12

## 2021-12-16 DIAGNOSIS — N3281 Overactive bladder: Secondary | ICD-10-CM | POA: Diagnosis not present

## 2021-12-16 DIAGNOSIS — M545 Low back pain, unspecified: Secondary | ICD-10-CM | POA: Diagnosis not present

## 2021-12-16 LAB — POCT URINALYSIS DIPSTICK
Bilirubin, UA: NEGATIVE
Glucose, UA: NEGATIVE
Ketones, UA: NEGATIVE
Leukocytes, UA: NEGATIVE
Nitrite, UA: NEGATIVE
Spec Grav, UA: >=1.03 — AB (ref 1.010–1.025)
Urobilinogen, UA: 0.2 E.U./dL
pH, UA: 6 (ref 5.0–8.0)

## 2021-12-16 MED ORDER — GEMTESA 75 MG PO TABS: ORAL_TABLET | ORAL | 11 refills | Status: DC

## 2021-12-16 NOTE — Progress Notes (Signed)
[facility] Return Visit  SUBJECTIVE  History of Present Illness: Jodi [name] is a 76 y.o. female seen in follow-up for overactive bladder. Plan at last visit was to start [facility] '75mg'$ . She had previously been on Myrbetriq without improvement.   She has a hard time standing due to pain on the left side of her back. She took two advil and used a heating pad on her back so that improved her pain. The pain started yesterday. On Sunday she fell while getting up. She was treated for a urinary tract infection earlier this month and took bactrim for 10 days.   She is taking the [place] and she does not leak urine at all on the medication. Some days she has missed the medication and her symptoms return and she is unable to hold the urine. The medication is covered by her insurance.   Past Medical History: Patient  has a past medical history of Anemia, Diabetes mellitus without complication ([place]), Hypertension, Insomnia, and Polio.   Past Surgical History: She  has a past surgical history that includes Cesarean section and Shoulder surgery (Left, 2010).   Medications: She has a current medication list which includes the following prescription(s): accu-chek softclix lancets, alendronate, amlodipine, atorvastatin, accu-chek guide me, calcium, diclofenac sodium, dimenhydrinate, lumbar back brace/support pad, ferrous sulfate, fluticasone, gemtesa, accu-chek guide, hydrochlorothiazide, fusion plus, accu-chek fastclix lancet, loratadine, meclizine, metformin, methocarbamol, omeprazole, senna-docusate, triamcinolone cream, vibegron, and vitamin d (cholecalciferol).   Allergies: Patient is allergic to cortisone, gabapentin, pork-derived products, shellfish allergy, cortisone, and shellfish allergy.   Social History: Patient  reports that she has never smoked. She has never used smokeless tobacco. She reports that she does not currently use alcohol. She reports that she does not use  drugs.      OBJECTIVE     Physical Exam: Vitals:   12/16/21 1518  BP: 129/83  Pulse: 99  Resp: 12  Temp: 98 F (36.7 C)  TempSrc: Oral  SpO2: 96%    Gen: No apparent distress, A&O x 3.  Detailed Urogynecologic Evaluation:  Deferred.    POC urine: trace blood otherwise negative ASSESSMENT AND PLAN    Jodi [name] is a 76 y.o. with:  1. Overactive bladder    - continue with Gemtesa '75mg'$ , refill provided - suspect the back pain is musculoskeletal since the urine is negative for infection. She will continue to take advil and use a heating pad and if no improvement in a few days will contact PCP.   Follow up 1 year or sooner if needed  Jaquita Folds, MD   Time spent: I spent 25 minutes dedicated to the care of this patient on the date of this encounter to include pre-visit review of records, face-to-face time with the patient and post visit documentation.

## 2021-12-19 ENCOUNTER — Ambulatory Visit (INDEPENDENT_AMBULATORY_CARE_PROVIDER_SITE_OTHER): Payer: Medicare HMO

## 2021-12-19 ENCOUNTER — Encounter: Payer: Self-pay | Admitting: Family Medicine

## 2021-12-19 ENCOUNTER — Ambulatory Visit (INDEPENDENT_AMBULATORY_CARE_PROVIDER_SITE_OTHER): Payer: Medicare HMO | Admitting: Family Medicine

## 2021-12-19 VITALS — BP 128/80 | HR 73 | Resp 16 | Ht 60.0 in | Wt 143.0 lb

## 2021-12-19 DIAGNOSIS — I10 Essential (primary) hypertension: Secondary | ICD-10-CM | POA: Diagnosis not present

## 2021-12-19 DIAGNOSIS — M545 Low back pain, unspecified: Secondary | ICD-10-CM

## 2021-12-19 LAB — BASIC METABOLIC PANEL
BUN: 20 mg/dL (ref 6–23)
CO2: 27 mEq/L (ref 19–32)
Calcium: 9.4 mg/dL (ref 8.4–10.5)
Chloride: 104 mEq/L (ref 96–112)
Creatinine, Ser: 0.6 mg/dL (ref 0.40–1.20)
GFR: 86.11 mL/min (ref >60.00)
Glucose, Bld: 97 mg/dL (ref 70–99)
Potassium: 3.7 mEq/L (ref 3.5–5.1)
Sodium: 137 mEq/L (ref 135–145)

## 2021-12-19 MED ORDER — PREDNISONE 20 MG PO TABS: ORAL_TABLET | ORAL | 0 refills | Status: DC

## 2021-12-19 MED ORDER — CELECOXIB 100 MG PO CAPS: 100.0000 mg | ORAL_CAPSULE | Freq: Two times a day (BID) | ORAL | 0 refills | Status: AC

## 2021-12-19 NOTE — Patient Instructions (Addendum)
A few things to remember from today's visit:  Acute left-sided low back pain, unspecified whether sciatica present - Plan: DG Lumbar Spine Complete, predniSONE (DELTASONE) 20 MG tablet, celecoxib (CELEBREX) 100 MG capsule, Basic metabolic panel  HTN (hypertension), benign - Plan: Basic metabolic panel  El dolor de la espalda puede ser muscular o artritis. Porque es nuevo estamos haciendo radiografa hoy. Por el dolor que tubo de la pierna, estoy recomendando Prednisone. tmelo en la maana con desayuno y NO lo tome al mismo tiempo que el celebrex. Mantenga la cita conDolor de espalda agudo en los adultos Acute Back Pain, Adult El dolor de espalda agudo es repentino y por lo general no dura mucho tiempo. Se debe generalmente a una lesin de los msculos y tejidos de la espalda. La lesin puede ser el resultado de: Estiramiento en exceso o desgarro de un msculo, tendn o ligamento. Los ligamentos son tejidos que [employer]. Levantar algo de forma incorrecta puede producir un esguince de espalda. Desgaste (degeneracin) de los discos vertebrales. Los discos vertebrales son tejidos circulares que proporcionan amortiguacin entre los huesos de la columna vertebral (vrtebras). Movimientos de giro, como al practicar deportes o realizar trabajos de [place]. Un golpe en la espalda. Artritis. Es posible Hydrologist un examen fsico, anlisis de laboratorio u otros estudios de diagnstico por imgenes para Animator causa del Social research officer, government. El dolor de espalda agudo generalmente desaparece con reposo y cuidados en la casa. Siga estas instrucciones en su casa: Control del dolor, la rigidez y Forensic psychologist los medicamentos de venta libre y los recetados solamente como se lo haya indicado el mdico. El tratamiento puede incluir medicamentos para Conservation officer, historic buildings y la inflamacin que se toman por la boca o que se aplican sobre la piel, o relajantes musculares. El mdico puede recomendarle que se aplique  hielo durante las primeras 24 a 6 horas despus del comienzo del Social research officer, government. Para hacer esto: Ponga el hielo en una bolsa plstica. Coloque una toalla entre la piel y Therapist, nutritional. Aplique el hielo durante 20 minutos, 2 o 3 veces por da. Retire el hielo si la piel se pone de color rojo brillante. Esto es [employer]. Si no puede sentir dolor, calor o fro, tiene un mayor riesgo de que se dae la zona. Si se lo indican, aplique calor en la zona afectada con la frecuencia que le haya indicado el mdico. Use la fuente de calor que el mdico le recomiende, como una compresa de calor hmedo o una almohadilla trmica. Coloque una toalla entre la piel y la fuente de Freight forwarder. Aplique calor durante 20 a 30 minutos. Retire la fuente de calor si la piel se pone de color rojo brillante. Esto es especialmente importante si no puede sentir dolor, calor o fro. Corre un mayor riesgo de sufrir quemaduras. Actividad  No permanezca en la cama. Hacer reposo en la cama por ms de 1 a 2 das puede demorar su recuperacin. Mantenga una buena postura al sentarse y pararse. No se incline hacia adelante al sentarse ni se encorve al pararse. Si trabaja en un escritorio, sintese cerca de este para no tener que inclinarse. Mantenga el mentn hacia abajo. Mantenga el cuello hacia atrs y los codos flexionados en un ngulo de 90 grados (ngulo recto). Cuando conduzca, sintese elevado y cerca del volante. Agregue un apoyo para la espalda (lumbar) al asiento del automvil, si es necesario. Realice caminatas cortas en superficies planas tan pronto como le sea posible. Trate de caminar  un poco ms [place]. No se siente, conduzca o permanezca de pie en un mismo lugar durante ms de 30 minutos seguidos. Pararse o sentarse durante largos perodos de Radiographer, therapeutic la espalda. No conduzca ni use maquinaria pesada mientras toma analgsicos recetados. Use tcnicas apropiadas para levantar objetos. Cuando se inclina y  Chief Executive Officer un [place], utilice posiciones que no sobrecarguen tanto la espalda: [place]. Mantenga la carga cerca del cuerpo. No se tuerza. Haga actividad fsica habitualmente como se lo haya indicado el mdico. Hacer ejercicios ayuda a que la espalda sane ms rpido y [place] a Product/process development scientist las lesiones de la espalda al [employer] msculos fuertes y flexibles. Trabaje con un fisioterapeuta para crear un programa de ejercicios seguros, segn lo recomiende el mdico. Haga ejercicios como se lo haya indicado el fisioterapeuta. Estilo de vida Mantenga un peso saludable. El sobrepeso sobrecarga la espalda y hace que resulte difcil tener una buena [place]. Evite actividades o situaciones que lo hagan sentirse ansioso o estresado. El estrs y la ansiedad aumentan la tensin muscular y pueden empeorar el dolor de espalda. Aprenda formas de [employer] ansiedad y [place], como a travs del ejercicio. Instrucciones generales [place] sobre un colchn firme en una posicin cmoda. Intente acostarse de costado, con las rodillas ligeramente flexionadas. Si se recuesta [employer], coloque una almohada debajo de las rodillas. Mantenga la cabeza y el cuello en lnea recta con la columna vertebral (posicin neutra) cuando use equipos electrnicos como telfonos inteligentes o tablets. Para hacer esto: Levante el telfono inteligente o la tablet para Consulting civil engineer de inclinar la cabeza o el cuello para mirar hacia abajo. Coloque el telfono inteligente o la tablet al nivel de su cara mientras mira la pantalla. Siga el plan de tratamiento como se lo haya indicado el mdico. Esto puede incluir: Terapia cognitiva o conductual. Acupuntura o terapia de masajes. Yoga o meditacin. Comunquese con un mdico si: Siente un dolor que no se alivia con reposo o medicamentos. Siente mucho dolor que se extiende a las piernas o las nalgas. El dolor no mejora luego de 2 semanas. Siente dolor por la noche. Pierde  peso sin proponrselo. Tiene fiebre o escalofros. Siente nuseas o vmitos. Siente dolor abdominal. Solicite ayuda de inmediato si: Tiene nuevos problemas para controlar la vejiga o los intestinos. Siente debilidad o adormecimiento inusuales en los brazos o en las piernas. Siente que va a desmayarse. Estos sntomas pueden representar un problema grave que constituye Engineer, maintenance (IT). No espere a ver si los sntomas desaparecen. Solicite atencin mdica de inmediato. Comunquese con el servicio de emergencias de su localidad (911 en los [place]). No conduzca por sus propios medios [employer]. Resumen El dolor de espalda agudo es repentino y por lo general no dura mucho tiempo. Use tcnicas apropiadas para levantar objetos. Cuando se inclina y levanta un [place], utilice posiciones que no sobrecarguen tanto la espalda. Tome los medicamentos de venta libre y los recetados solamente como se lo haya indicado el mdico, y aplquese calor o hielo segn las indicaciones. Esta informacin no tiene Marine scientist el consejo del mdico. Asegrese de hacerle al mdico cualquier pregunta que tenga. Document Revised: 10/23/2020 Document Reviewed: 10/23/2020 Elsevier Patient Education  [place].

## 2021-12-19 NOTE — Progress Notes (Unsigned)
ACUTE VISIT Chief Complaint  Patient presents with   Pain    Left side   HPI: [name] is a 76 y.o. female with hx of depression,HLD,HTN,and GERD here today complaining of left lower back pain. Denies prior hx. Evaluated in acute care for severe LLE pain 5/112023. According to pt, she was Dx'ed with UTI and started on Bactrim x 10 d. States that she did not have UA done, she was not having urinary symptoms. She did follow with her urologist 12/16/21 and UA was not suggestive of UTI., Started with back pain last week of 11/2021. Back Pain This is a new problem. The current episode started 1 to 4 weeks ago. The problem is unchanged. The pain is present in the lumbar spine and gluteal. The pain is at a severity of 8/10. The pain is severe. The symptoms are aggravated by bending and twisting. Stiffness is present All day. Pertinent negatives include no abdominal pain, bowel incontinence, chest pain, dysuria, fever, headaches, numbness, paresthesias, pelvic pain, perianal numbness, tingling, weakness or weight loss. She has tried analgesics for the symptoms. The treatment provided mild relief.  One episode of urine incontinence when she fell while getting up from bed. Her left foot "did not respond" and lower back pain was intense, lost balance. She has not had other episode of incontinence. Negative for hematuria or decreased urine output. HTN on HCTZ 12.5 mg daily. Back pain has been 10/10, better when she takes Naproxen.  Review of Systems  Constitutional:  Negative for fever and weight loss.  Cardiovascular:  Negative for chest pain.  Gastrointestinal:  Negative for abdominal pain, bowel incontinence, nausea and vomiting.  Genitourinary:  Negative for dysuria and pelvic pain.  Musculoskeletal:  Positive for back pain and gait problem.  Skin:  Negative for rash.  Neurological:  Negative for tingling, weakness, numbness, headaches and paresthesias.  Rest see pertinent  positives and negatives per HPI.  Current Outpatient Medications on File Prior to Visit  Medication Sig Dispense Refill   ACCU-CHEK SOFTCLIX LANCETS lancets Use as instructed to test blood glucose twice daily. 100 each 12   alendronate (FOSAMAX) 70 MG tablet Take 1 tablet (70 mg total) by mouth once a week. TAKE 1 TABLET (70 MG TOTAL) BY MOUTH ONCE A WEEK. 13 tablet 3   amLODipine (NORVASC) 5 MG tablet Take 0.5 tablets (2.5 mg total) by mouth daily. 45 tablet 3   atorvastatin (LIPITOR) 10 MG tablet Take 1 tablet (10 mg total) by mouth daily. 90 tablet 2   Blood Glucose Monitoring Suppl (ACCU-CHEK GUIDE ME) w/Device KIT Use as directed 1 kit 0   CALCIUM PO Take 1 tablet by mouth daily.     diclofenac Sodium (VOLTAREN) 1 % GEL APPLY 4 G TOPICALLY 4 (FOUR) TIMES DAILY. LEFT KNEE. 100 g 6   dimenhyDRINATE (DRAMAMINE) 50 MG tablet Take 1 tablet (50 mg total) by mouth every 8 (eight) hours as needed. 30 tablet 0   Elastic Bandages & Supports (LUMBAR BACK BRACE/SUPPORT PAD) MISC 1 application by Does not apply route daily. 1 each 0   ferrous sulfate 325 (65 FE) MG tablet Take 1 tablet (325 mg total) by mouth daily with breakfast. 90 tablet 1   fluticasone (FLONASE) 50 MCG/ACT nasal spray PLACE 1 SPRAY INTO BOTH NOSTRILS DAILY AS NEEDED FOR ALLERGIES OR RHINITIS. 48 mL 1   GEMTESA 75 MG TABS TOME UNA TABLETA TODOS LOS DIAS 30 tablet 11   glucose blood (ACCU-CHEK GUIDE) test  strip Use as instructed 100 each 12   hydrochlorothiazide (HYDRODIURIL) 12.5 MG tablet TOME UNA TABLETA TODOS LOS DIAS 90 tablet 1   Iron-FA-B Cmp-C-Biot-Probiotic (FUSION PLUS) CAPS Take 1 tablet by mouth every other day. As tolerated 90 capsule 2   Lancets Misc. (ACCU-CHEK FASTCLIX LANCET) KIT Use as directed 1 kit 6   loratadine (CLARITIN) 10 MG tablet TOME UNA TABLETA TODOS LOS DIAS 90 tablet 1   meclizine (ANTIVERT) 12.5 MG tablet Take 1 tablet (12.5 mg total) by mouth daily as needed for dizziness. 30 tablet 0   metFORMIN  (GLUCOPHAGE) 500 MG tablet TOME UNA TABLETA TODOS LOS DIAS CON EL DESAYUNO 90 tablet 0   methocarbamol (ROBAXIN) 500 MG tablet Take 1 tablet (500 mg total) by mouth 2 (two) times daily. 30 tablet 0   omeprazole (PRILOSEC) 40 MG capsule TOME UNA CAPSULA TODOS LOS DIAS 90 capsule 2   senna-docusate (SENOKOT-S) 8.6-50 MG tablet Take 1 tablet by mouth at bedtime as needed for mild constipation. 120 tablet 3   triamcinolone cream (KENALOG) 0.1 % APLIQUE AL AREA AFECTADA DOS VECES AL DIA 30 g 1   Vibegron 75 MG TABS Take 1 tablet by mouth daily. 30 tablet 5   Vitamin D, Cholecalciferol, 10 MCG (400 UNIT) CHEW Chew 2 tablets (800 Units total) by mouth daily. 60 tablet 6   No current facility-administered medications on file prior to visit.   Past Medical History:  Diagnosis Date   Anemia    Diabetes mellitus without complication (HCC)    Hypertension    Insomnia    Polio    in childhood   Allergies  Allergen Reactions   Cortisone     Rash, swelling   Gabapentin Nausea Only    With dizziness   Pork-Derived Products     Rash, joint pain   Shellfish Allergy    Cortisone Rash   Shellfish Allergy Rash and Other (See Comments)    joint pain, GI pain    Social History   Socioeconomic History   Marital status: Unknown    Spouse name: Not on file   Number of children: Not on file   Years of education: Not on file   Highest education level: Not on file  Occupational History   Not on file  Tobacco Use   Smoking status: Never   Smokeless tobacco: Never  Vaping Use   Vaping Use: Never used  Substance and Sexual Activity   Alcohol use: Not Currently   Drug use: Never   Sexual activity: Not Currently  Other Topics Concern   Not on file  Social History Narrative   ** Merged History Encounter **       Social Determinants of Health   Financial Resource Strain: Low Risk    Difficulty of Paying Living Expenses: Not hard at all  Food Insecurity: No Food Insecurity   Worried About  Charity fundraiser in the Last Year: Never true   Ran Out of Food in the Last Year: Never true  Transportation Needs: No Transportation Needs   Lack of Transportation (Medical): No   Lack of Transportation (Non-Medical): No  Physical Activity: Sufficiently Active   Days of Exercise per Week: 7 days   Minutes of Exercise per Session: 40 min  Stress: Stress Concern Present   Feeling of Stress : Rather much  Social Connections: Moderately Isolated   Frequency of Communication with Friends and Family: More than three times a week   Frequency of Social  Gatherings with Friends and Family: More than three times a week   Attends Religious Services: More than 4 times per year   Active Member of Clubs or Organizations: No   Attends Archivist Meetings: Never   Marital Status: Divorced   Vitals:   12/19/21 1009  BP: 128/80  Pulse: 73  Resp: 16  SpO2: 97%   Body mass index is 27.93 kg/m.  Physical Exam Vitals and nursing note reviewed.  Constitutional:      General: She is not in acute distress.    Appearance: She is well-developed. She is not ill-appearing.  HENT:     Head: Normocephalic and atraumatic.  Eyes:     Conjunctiva/sclera: Conjunctivae normal.  Cardiovascular:     Rate and Rhythm: Normal rate and regular rhythm.     Pulses:          Dorsalis pedis pulses are 2+ on the right side and 2+ on the left side.  Pulmonary:     Effort: Pulmonary effort is normal. No respiratory distress.     Breath sounds: Normal breath sounds.  Abdominal:     Palpations: Abdomen is soft. There is no hepatomegaly or mass.     Tenderness: There is no abdominal tenderness.  Musculoskeletal:     Lumbar back: Tenderness present. No bony tenderness. Negative right straight leg raise test and negative left straight leg raise test.       Back:     Right hip: Normal range of motion.     Left hip: Normal range of motion.     Comments: No significant deformity appreciated. Pain elicited  with movement on exam table during examination. No local edema or erythema appreciated, no suspicious lesions. Flexion of left hip elicits left lower back pain.  Skin:    General: Skin is warm.     Findings: No erythema or rash.  Neurological:     General: No focal deficit present.     Mental Status: She is alert and oriented to person, place, and time.     Comments: Antalgic gait. Symmetric patellar DTR's.  Cannot walk on tip toes with left foot. Walking on heels aggravates pain but can do it.  Psychiatric:        Mood and Affect: Mood is anxious.     Comments: Well groomed, good eye contact.   ASSESSMENT AND PLAN:  Ms.Christen was seen today for pain.  Diagnoses and all orders for this visit: Orders Placed This Encounter  Procedures   DG Lumbar Spine Complete   Basic metabolic panel   Lab Results  Component Value Date   CREATININE 0.60 12/19/2021   BUN 20 12/19/2021   NA 137 12/19/2021   K 3.7 12/19/2021   CL 104 12/19/2021   CO2 27 12/19/2021   Acute left-sided low back pain, unspecified whether sciatica present LLE she had earlier 11/2021 could have be radicular pain.  After discussed of some side effects she agrees with trying Prednisone taper. Celebrex 100 mg bid x 7-10 days. Avoid taking medications together. Instructed about warning signs. F/U in 4 weeks, before if needed. Lumbar X ray ordered today.  -     predniSONE (DELTASONE) 20 MG tablet; 2 tabs for 5 days, 1 tabs for 4 days, and 1/2 tab for 3 days. Take tables together with breakfast. -     celecoxib (CELEBREX) 100 MG capsule; Take 1 capsule (100 mg total) by mouth 2 (two) times daily for 10 days.  HTN (hypertension), benign  BP adequately controlled. We discussed side effects of NSAID"s. Monitor BP while taking Celebrex.  I spent a total of 32 minutes in both face to face and non face to face activities for this visit on the date of this encounter. During this time history was obtained and documented,  examination was performed, prior labs reviewed, and assessment/plan discussed.  Return in about 4 weeks (around 01/16/2022).  Siria Calandro G. Martinique, MD  [facility]. [place] office.

## 2021-12-20 ENCOUNTER — Encounter: Payer: Self-pay | Admitting: Family Medicine

## 2021-12-25 ENCOUNTER — Other Ambulatory Visit: Payer: Self-pay | Admitting: Internal Medicine

## 2021-12-25 DIAGNOSIS — I10 Essential (primary) hypertension: Secondary | ICD-10-CM

## 2021-12-25 NOTE — Telephone Encounter (Signed)
Change in provider Requested Prescriptions  Pending Prescriptions Disp Refills  . amLODipine (NORVASC) 5 MG tablet [Pharmacy Med Name: AMLODIPINE BESYLATE 5 MG TAB] 45 tablet 3    Sig: TAKE 1/2 TABLET POR VIA ORAL A DIARIO     Cardiovascular: Calcium Channel Blockers 2 Failed - 12/25/2021  3:25 AM      Failed - Valid encounter within last 6 months    Recent Outpatient Visits          6 months ago Right ear pain   [facility], Kriste Basque, NP   7 months ago Other acute nonsuppurative otitis media of right ear, recurrence not specified   [place], [place]   7 months ago Ear pain, right   [place] Kar[facility] Plumber B, MD   11 months ago Type 2 diabetes, controlled, with neuropathy [facility])   [place], Deborah B, MD   1 year ago Type 2 diabetes mellitus without complication, without long-term current use of insulin [facility])   [place], MD      Future Appointments            In 3 weeks Martinique, Malka So, MD [employer] at [place], [place]   In 11 months Jaquita Folds, MD Urogynecology at [facility], [place] - Last BP in normal range    BP Readings from Last 1 Encounters:  12/19/21 128/80         Passed - Last Heart Rate in normal range    Pulse Readings from Last 1 Encounters:  12/19/21 73

## 2022-01-02 ENCOUNTER — Other Ambulatory Visit: Payer: Self-pay | Admitting: Internal Medicine

## 2022-01-02 DIAGNOSIS — I10 Essential (primary) hypertension: Secondary | ICD-10-CM

## 2022-01-06 ENCOUNTER — Ambulatory Visit: Payer: Medicare HMO | Admitting: Internal Medicine

## 2022-01-09 ENCOUNTER — Telehealth: Payer: Self-pay | Admitting: Internal Medicine

## 2022-01-09 DIAGNOSIS — E1140 Type 2 diabetes mellitus with diabetic neuropathy, unspecified: Secondary | ICD-10-CM

## 2022-01-09 NOTE — Telephone Encounter (Signed)
Patient states pharmacy needs approval for Metformin and Iron-FA-B Cmp-C-Biot-Probiotic Caps.  Please call pt back at [phone].

## 2022-01-10 ENCOUNTER — Other Ambulatory Visit: Payer: Self-pay | Admitting: Internal Medicine

## 2022-01-10 DIAGNOSIS — E1140 Type 2 diabetes mellitus with diabetic neuropathy, unspecified: Secondary | ICD-10-CM

## 2022-01-12 MED ORDER — METFORMIN HCL 500 MG PO TABS: ORAL_TABLET | ORAL | 0 refills | Status: DC

## 2022-01-12 MED ORDER — FUSION PLUS PO CAPS: 1.0000 | ORAL_CAPSULE | ORAL | 0 refills | Status: DC

## 2022-01-12 NOTE — Telephone Encounter (Signed)
Refill sent.

## 2022-01-13 ENCOUNTER — Ambulatory Visit: Payer: Medicare HMO | Admitting: Internal Medicine

## 2022-01-14 NOTE — Progress Notes (Unsigned)
HPI: [name] is a 76 y.o. female, who is here today to follow on recent visit. I saw her on 12/19/21 because left sided lower back pain, severe. She was evaluated for severe LLE pain on 11/27/2021. Lumbar X ray: 1. Severe multilevel degenerative changes with dextroscoliosis centered in the upper lumbar spine. Evaluation for acute fracture is limited secondary to multiple overlapping structures and sclerosis of endplates.  Prednisone taper and celebrex were prescribed. Tolerated medications well. Pain has greatly improved, now 9[birth year]79 with certain movements. Negative for saddle anesthesia,bowel/bladder dysfunction, LE numbness or tinging or pain.  She is requesting CBC done.  Reporting blood transfusion 4 years ago, unknown etiology. She is on iron supplementation.  Lab Results  Component Value Date   WBC 6.8 01/07/2021   HGB 12.4 01/07/2021   HCT 38.0 01/07/2021   MCV 91 01/07/2021   PLT 404 01/07/2021   Loud snoring, she is now sleeping in another room because disturbs husband's sleep. Her own snoring wakes her up. Her granddaughter has witnessed sleep apnea. Not rested in the morning when she wakes up. Negative headache. She has used mouth appliances OTC and nose strips but have not help. No nasal congestion.  Inquiring about Metformin refills.  DM II on Metformin 500 mg once daily. She is not checking BS's. Negative for polydipsia,polyuria, or polyphagia.  Lab Results  Component Value Date   HGBA1C 5.8 01/07/2021   Review of Systems  Constitutional:  Negative for activity change, appetite change and fever.  HENT:  Positive for tinnitus (chronic, improves with hearing aids.). Negative for mouth sores, nosebleeds and trouble swallowing.   Respiratory:  Negative for cough and shortness of breath.   Cardiovascular:  Negative for chest pain, palpitations and leg swelling.  Gastrointestinal:  Negative for abdominal pain, nausea and vomiting.       Negative  for changes in bowel habits.  Genitourinary:  Negative for decreased urine volume, dysuria and hematuria.  Skin:  Negative for rash.  Neurological:  Negative for syncope and weakness.  Psychiatric/Behavioral:  Negative for confusion. The patient is nervous/anxious.   Rest see pertinent positives and negatives per HPI.  Current Outpatient Medications on File Prior to Visit  Medication Sig Dispense Refill   ACCU-CHEK SOFTCLIX LANCETS lancets Use as instructed to test blood glucose twice daily. 100 each 12   alendronate (FOSAMAX) 70 MG tablet Take 1 tablet (70 mg total) by mouth once a week. TAKE 1 TABLET (70 MG TOTAL) BY MOUTH ONCE A WEEK. 13 tablet 3   amLODipine (NORVASC) 5 MG tablet Take 0.5 tablets (2.5 mg total) by mouth daily. 45 tablet 3   atorvastatin (LIPITOR) 10 MG tablet Take 1 tablet (10 mg total) by mouth daily. 90 tablet 2   Blood Glucose Monitoring Suppl (ACCU-CHEK GUIDE ME) w/Device KIT Use as directed 1 kit 0   CALCIUM PO Take 1 tablet by mouth daily.     diclofenac Sodium (VOLTAREN) 1 % GEL APPLY 4 G TOPICALLY 4 (FOUR) TIMES DAILY. LEFT KNEE. 100 g 6   dimenhyDRINATE (DRAMAMINE) 50 MG tablet Take 1 tablet (50 mg total) by mouth every 8 (eight) hours as needed. 30 tablet 0   Elastic Bandages & Supports (LUMBAR BACK BRACE/SUPPORT PAD) MISC 1 application by Does not apply route daily. 1 each 0   ferrous sulfate 325 (65 FE) MG tablet Take 1 tablet (325 mg total) by mouth daily with breakfast. 90 tablet 1   fluticasone (FLONASE) 50 MCG/ACT nasal spray PLACE 1  SPRAY INTO BOTH NOSTRILS DAILY AS NEEDED FOR ALLERGIES OR RHINITIS. 48 mL 1   GEMTESA 75 MG TABS TOME UNA TABLETA TODOS LOS DIAS 30 tablet 11   glucose blood (ACCU-CHEK GUIDE) test strip Use as instructed 100 each 12   hydrochlorothiazide (HYDRODIURIL) 12.5 MG tablet TOME UNA TABLETA TODOS LOS DIAS 90 tablet 1   Iron-FA-B Cmp-C-Biot-Probiotic (FUSION PLUS) CAPS Take 1 tablet by mouth every other day. As tolerated 90 capsule 0    Lancets Misc. (ACCU-CHEK FASTCLIX LANCET) KIT Use as directed 1 kit 6   loratadine (CLARITIN) 10 MG tablet TOME UNA TABLETA TODOS LOS DIAS 90 tablet 1   meclizine (ANTIVERT) 12.5 MG tablet Take 1 tablet (12.5 mg total) by mouth daily as needed for dizziness. 30 tablet 0   metFORMIN (GLUCOPHAGE) 500 MG tablet TOME UNA TABLETA TODOS LOS DIAS CON EL DESAYUNO 90 tablet 0   methocarbamol (ROBAXIN) 500 MG tablet Take 1 tablet (500 mg total) by mouth 2 (two) times daily. 30 tablet 0   omeprazole (PRILOSEC) 40 MG capsule TOME UNA CAPSULA TODOS LOS DIAS 90 capsule 2   senna-docusate (SENOKOT-S) 8.6-50 MG tablet Take 1 tablet by mouth at bedtime as needed for mild constipation. 120 tablet 3   triamcinolone cream (KENALOG) 0.1 % APLIQUE AL AREA AFECTADA DOS VECES AL DIA 30 g 1   Vibegron 75 MG TABS Take 1 tablet by mouth daily. 30 tablet 5   Vitamin D, Cholecalciferol, 10 MCG (400 UNIT) CHEW Chew 2 tablets (800 Units total) by mouth daily. 60 tablet 6   No current facility-administered medications on file prior to visit.   Past Medical History:  Diagnosis Date   Anemia    Diabetes mellitus without complication (HCC)    Hypertension    Insomnia    Polio    in childhood   Allergies  Allergen Reactions   Cortisone     Rash, swelling   Gabapentin Nausea Only    With dizziness   Pork-Derived Products     Rash, joint pain   Shellfish Allergy    Cortisone Rash   Shellfish Allergy Rash and Other (See Comments)    joint pain, GI pain    Social History   Socioeconomic History   Marital status: Unknown    Spouse name: Not on file   Number of children: Not on file   Years of education: Not on file   Highest education level: Not on file  Occupational History   Not on file  Tobacco Use   Smoking status: Never   Smokeless tobacco: Never  Vaping Use   Vaping Use: Never used  Substance and Sexual Activity   Alcohol use: Not Currently   Drug use: Never   Sexual activity: Not Currently   Other Topics Concern   Not on file  Social History Narrative   ** Merged History Encounter **       Social Determinants of Health   Financial Resource Strain: Low Risk  (04/11/2021)   Overall Financial Resource Strain (CARDIA)    Difficulty of Paying Living Expenses: Not hard at all  Food Insecurity: No Food Insecurity (04/11/2021)   Hunger Vital Sign    Worried About Running Out of Food in the Last Year: Never true    Ran Out of Food in the Last Year: Never true  Transportation Needs: No Transportation Needs (04/11/2021)   PRAPARE - Hydrologist (Medical): No    Lack of Transportation (Non-Medical): No  Physical Activity: Sufficiently Active (04/11/2021)   Exercise Vital Sign    Days of Exercise per Week: 7 days    Minutes of Exercise per Session: 40 min  Stress: Stress Concern Present (04/11/2021)   [place]    Feeling of Stress : Rather much  Social Connections: Moderately Isolated (04/11/2021)   Social Connection and Isolation Panel [NHANES]    Frequency of Communication with Friends and Family: More than three times a week    Frequency of Social Gatherings with Friends and Family: More than three times a week    Attends Religious Services: More than 4 times per year    Active Member of Clubs or Organizations: No    Attends Archivist Meetings: Never    Marital Status: Divorced   Vitals:   01/16/22 0845  BP: 120/70  Pulse: 77  Resp: 16  SpO2: 98%   Body mass index is 27.63 kg/m.  Physical Exam Vitals and nursing note reviewed.  Constitutional:      General: She is not in acute distress.    Appearance: She is well-developed.  HENT:     Head: Normocephalic and atraumatic.     Mouth/Throat:     Mouth: Mucous membranes are moist.     Pharynx: Oropharynx is clear.  Eyes:     Conjunctiva/sclera: Conjunctivae normal.  Cardiovascular:     Rate and Rhythm: Normal  rate and regular rhythm.     Heart sounds: No murmur heard. Pulmonary:     Effort: Pulmonary effort is normal. No respiratory distress.     Breath sounds: Normal breath sounds.  Abdominal:     Palpations: Abdomen is soft. There is no hepatomegaly or mass.     Tenderness: There is no abdominal tenderness.  Musculoskeletal:     Lumbar back: Tenderness present.       Back:  Lymphadenopathy:     Cervical: No cervical adenopathy.  Skin:    General: Skin is warm.     Findings: No erythema or rash.  Neurological:     General: No focal deficit present.     Mental Status: She is alert and oriented to person, place, and time.     Cranial Nerves: No cranial nerve deficit.     Gait: Gait normal.  Psychiatric:     Comments: Well groomed, good eye contact.   ASSESSMENT AND PLAN: Ms.Tashae was seen today for follow-up.  Diagnoses and all orders for this visit: Orders Placed This Encounter  Procedures   CBC   Iron   Ferritin   Hemoglobin A1c   Ambulatory referral to Sleep Studies   Lab Results  Component Value Date   WBC 4.8 01/16/2022   HGB 11.5 (L) 01/16/2022   HCT 35.5 (L) 01/16/2022   MCV 94.6 01/16/2022   PLT 364.0 01/16/2022   Lab Results  Component Value Date   HGBA1C 6.2 01/16/2022   Sleep apnea, unspecified type We discussed Dx and Dx criteria as well as treatment. Sleep study will be arranged. Wt loss will help.  Iron deficiency anemia, unspecified iron deficiency anemia type Reported by pt. Colonoscopy 07/30/16. Further recommendations according to CBC and iron studies. Continue iron supplementation.  Type 2 diabetes, controlled, with neuropathy ([place]) Problem has been well controlled. Continue Metformin 500 mg daily. Further recommendation according to HgA1C.  Left-sided low back pain without sciatica, unspecified chronicity Great improvement. Instructed about warning signs. She would like to hold on PT, she has  done it before and remember  exercises.  Return in about 6 months (around 07/18/2022).  Jodi Lynk G. Martinique, MD  [facility]. [place] office.

## 2022-01-16 ENCOUNTER — Encounter: Payer: Self-pay | Admitting: Family Medicine

## 2022-01-16 ENCOUNTER — Ambulatory Visit (INDEPENDENT_AMBULATORY_CARE_PROVIDER_SITE_OTHER): Payer: Medicare HMO | Admitting: Family Medicine

## 2022-01-16 VITALS — BP 120/70 | HR 77 | Resp 16 | Ht 60.0 in | Wt 141.5 lb

## 2022-01-16 DIAGNOSIS — E1140 Type 2 diabetes mellitus with diabetic neuropathy, unspecified: Secondary | ICD-10-CM | POA: Diagnosis not present

## 2022-01-16 DIAGNOSIS — M545 Low back pain, unspecified: Secondary | ICD-10-CM | POA: Diagnosis not present

## 2022-01-16 DIAGNOSIS — D509 Iron deficiency anemia, unspecified: Secondary | ICD-10-CM

## 2022-01-16 DIAGNOSIS — G4730 Sleep apnea, unspecified: Secondary | ICD-10-CM

## 2022-01-16 LAB — FERRITIN: Ferritin: 18 ng/mL (ref 10.0–291.0)

## 2022-01-16 LAB — HEMOGLOBIN A1C: Hgb A1c MFr Bld: 6.2 % (ref 4.6–6.5)

## 2022-01-16 LAB — CBC
HCT: 35.5 % — ABNORMAL LOW (ref 36.0–46.0)
Hemoglobin: 11.5 g/dL — ABNORMAL LOW (ref 12.0–15.0)
MCHC: 32.5 g/dL (ref 30.0–36.0)
MCV: 94.6 fl (ref 78.0–100.0)
Platelets: 364 10*3/uL (ref 150.0–400.0)
RBC: 3.75 Mil/uL — ABNORMAL LOW (ref 3.87–5.11)
RDW: 15.9 % — ABNORMAL HIGH (ref 11.5–15.5)
WBC: 4.8 10*3/uL (ref 4.0–10.5)

## 2022-01-16 LAB — IRON: Iron: 31 ug/dL — ABNORMAL LOW (ref 42–145)

## 2022-01-16 NOTE — Patient Instructions (Signed)
A few things to remember from today's visit:   Sleep apnea, unspecified type - Plan: Ambulatory referral to Sleep Studies  Iron deficiency anemia, unspecified iron deficiency anemia type - Plan: CBC, Iron, Ferritin  Type 2 diabetes, controlled, with neuropathy ([place]) - Plan: Hemoglobin A1c  Do not use My Chart to request refills or for acute issues that need immediate attention.   Siga haciendo los ejercicios para la espalda en la case. Si el dolor empeora otra vez, vamos a necesitar una consulta con ortopedista. Un estudio del sueo pendiente. Puede tomar Psychiatrist. Please be sure medication list is accurate. If a new problem present, please set up appointment sooner than planned today.

## 2022-01-19 ENCOUNTER — Other Ambulatory Visit: Payer: Self-pay | Admitting: Internal Medicine

## 2022-01-19 DIAGNOSIS — L309 Dermatitis, unspecified: Secondary | ICD-10-CM

## 2022-01-23 ENCOUNTER — Telehealth: Payer: Self-pay | Admitting: Family Medicine

## 2022-01-23 DIAGNOSIS — K219 Gastro-esophageal reflux disease without esophagitis: Secondary | ICD-10-CM

## 2022-01-23 DIAGNOSIS — E1140 Type 2 diabetes mellitus with diabetic neuropathy, unspecified: Secondary | ICD-10-CM

## 2022-01-23 DIAGNOSIS — I10 Essential (primary) hypertension: Secondary | ICD-10-CM

## 2022-01-23 DIAGNOSIS — E1169 Type 2 diabetes mellitus with other specified complication: Secondary | ICD-10-CM

## 2022-01-23 MED ORDER — ATORVASTATIN CALCIUM 10 MG PO TABS: 10.0000 mg | ORAL_TABLET | Freq: Every day | ORAL | 2 refills | Status: DC

## 2022-01-23 MED ORDER — AMLODIPINE BESYLATE 5 MG PO TABS: 2.5000 mg | ORAL_TABLET | Freq: Every day | ORAL | 3 refills | Status: DC

## 2022-01-23 MED ORDER — OMEPRAZOLE 40 MG PO CPDR: DELAYED_RELEASE_CAPSULE | ORAL | 2 refills | Status: DC

## 2022-01-23 MED ORDER — METFORMIN HCL 500 MG PO TABS: ORAL_TABLET | ORAL | 1 refills | Status: DC

## 2022-01-23 MED ORDER — HYDROCHLOROTHIAZIDE 12.5 MG PO TABS: 12.5000 mg | ORAL_TABLET | Freq: Every day | ORAL | 1 refills | Status: DC

## 2022-01-23 NOTE — Telephone Encounter (Signed)
Rx's sent in. °

## 2022-01-23 NOTE — Telephone Encounter (Signed)
Patient needs all of medications that are under her old pcp, Dr.Johnson, sent to [facility] under Dr.Jordan as new prescribing provider.      Please send to  [id number] [place] [place] Phone:  [phone] Fax:  [phone]       Please advise

## 2022-02-04 ENCOUNTER — Other Ambulatory Visit: Payer: Self-pay | Admitting: Internal Medicine

## 2022-02-04 DIAGNOSIS — L309 Dermatitis, unspecified: Secondary | ICD-10-CM

## 2022-02-20 ENCOUNTER — Other Ambulatory Visit: Payer: Self-pay | Admitting: Nurse Practitioner

## 2022-02-20 DIAGNOSIS — M1712 Unilateral primary osteoarthritis, left knee: Secondary | ICD-10-CM

## 2022-02-20 NOTE — Telephone Encounter (Signed)
Requested Prescriptions  Pending Prescriptions Disp Refills  . diclofenac Sodium (VOLTAREN) 1 % GEL [Pharmacy Med Name: DICLOFENAC SODIUM 1% GEL] 100 g 0    Sig: APPLY 4 G TOPICALLY 4 (FOUR) TIMES DAILY. LEFT KNEE.     Analgesics:  Topicals Failed - 02/20/2022  2:30 AM      Failed - Manual Review: Labs are only required if the patient has taken medication for more than 8 weeks.      Failed - HGB in normal range and within 360 days    Hemoglobin  Date Value Ref Range Status  01/16/2022 11.5 (L) 12.0 - 15.0 g/dL Final  01/07/2021 12.4 11.1 - 15.9 g/dL Final         Failed - HCT in normal range and within 360 days    HCT  Date Value Ref Range Status  01/16/2022 35.5 (L) 36.0 - 46.0 % Final   Hematocrit  Date Value Ref Range Status  01/07/2021 38.0 34.0 - 46.6 % Final         Passed - PLT in normal range and within 360 days    Platelets  Date Value Ref Range Status  01/16/2022 364.0 150.0 - 400.0 K/uL Final  01/07/2021 404 150 - 450 x10E3/uL Final         Passed - Cr in normal range and within 360 days    Creatinine  Date Value Ref Range Status  03/25/2018 0.72 0.44 - 1.00 mg/dL Final   Creat  Date Value Ref Range Status  10/05/2016 0.66 0.60 - 0.93 mg/dL Final    Comment:      For patients > or = 78 years of age: The upper reference limit for Creatinine is approximately 13% higher for people identified as African-American.      Creatinine, Ser  Date Value Ref Range Status  12/19/2021 0.60 0.40 - 1.20 mg/dL Final         Passed - eGFR is 30 or above and within 360 days    GFR, Est African American  Date Value Ref Range Status  10/05/2016 >89 >=60 mL/min Final   GFR, Est AFR Am  Date Value Ref Range Status  03/25/2018 >60 >60 mL/min Final    Comment:    (NOTE) The eGFR has been calculated using the CKD EPI equation. This calculation has not been validated in all clinical situations. eGFR's persistently <60 mL/min signify possible Chronic Kidney Disease.     GFR calc Af Amer  Date Value Ref Range Status  05/12/2019 101 >59 mL/min/1.73 Final   GFR, Est Non African American  Date Value Ref Range Status  10/05/2016 89 >=60 mL/min Final   GFR, Estimated  Date Value Ref Range Status  03/25/2018 >60 >60 mL/min Final   GFR calc non Af Amer  Date Value Ref Range Status  05/12/2019 88 >59 mL/min/1.73 Final   GFR  Date Value Ref Range Status  12/19/2021 86.11 >60.00 mL/min Final    Comment:    Calculated using the CKD-EPI Creatinine Equation (2021)   eGFR  Date Value Ref Range Status  01/07/2021 94 >59 mL/min/1.73 Final         Passed - Patient is not pregnant      Passed - Valid encounter within last 12 months    Recent Outpatient Visits          8 months ago Right ear pain   [facility], Kriste Basque, NP   8 months ago Other acute  nonsuppurative otitis media of right ear, recurrence not specified   [place], [place]   9 months ago Ear pain, right   [place], Deborah B, MD   1 year ago Type 2 diabetes, controlled, with neuropathy [facility])   [place], MD   2 years ago Type 2 diabetes mellitus without complication, without long-term current use of insulin [facility])   [place], MD      Future Appointments            In 5 months Martinique, Malka So, MD [employer] at [place], [place]   In 10 months Wannetta Sender, Governor Rooks, MD Urogynecology at [facility], [facility]

## 2022-03-03 ENCOUNTER — Other Ambulatory Visit: Payer: Self-pay | Admitting: Family Medicine

## 2022-03-03 DIAGNOSIS — M810 Age-related osteoporosis without current pathological fracture: Secondary | ICD-10-CM

## 2022-03-11 ENCOUNTER — Encounter: Payer: Self-pay | Admitting: Neurology

## 2022-03-11 ENCOUNTER — Ambulatory Visit (INDEPENDENT_AMBULATORY_CARE_PROVIDER_SITE_OTHER): Payer: Medicare HMO | Admitting: Neurology

## 2022-03-11 VITALS — BP 108/63 | HR 70 | Ht 60.0 in | Wt 142.0 lb

## 2022-03-11 DIAGNOSIS — R0681 Apnea, not elsewhere classified: Secondary | ICD-10-CM | POA: Diagnosis not present

## 2022-03-11 DIAGNOSIS — G4719 Other hypersomnia: Secondary | ICD-10-CM | POA: Diagnosis not present

## 2022-03-11 DIAGNOSIS — R519 Headache, unspecified: Secondary | ICD-10-CM

## 2022-03-11 DIAGNOSIS — E663 Overweight: Secondary | ICD-10-CM | POA: Diagnosis not present

## 2022-03-11 DIAGNOSIS — R0683 Snoring: Secondary | ICD-10-CM | POA: Diagnosis not present

## 2022-03-11 DIAGNOSIS — Z9189 Other specified personal risk factors, not elsewhere classified: Secondary | ICD-10-CM

## 2022-03-11 NOTE — Progress Notes (Signed)
Subjective:    Patient ID: Jodi [name] is a 76 y.o. female.  HPI    Star Age, MD, PhD [facility] [address], [address] [place], [place] [address]  Dear Dr. Martinique,  I saw your patient, Jodi [name], upon your kind request in my sleep clinic today for initial consultation of her sleep disorder, in particular, concern for underlying obstructive sleep apnea.  The patient is accompanied by Octavia Bruckner, [place] interpreter today.  As you know, Jodi [name] is a 76 year old right-handed woman with an underlying medical history of diabetes, lumbar degenerative disc disease, hypertension, anemia, history of polio, and overweight state, who reports snoring and excessive daytime somnolence as well as witnessed apneas by family.  I reviewed your office note from 01/16/2022.  Her Epworth sleepiness score is 6 out of 24, fatigue severity score is 33 out of 63.  Her son has noted pauses in her breathing and family reports loud snoring when she is around them.  She lives alone, she is divorced, she goes to bed between 10 and 10:30 PM but has trouble falling asleep, may be awake for a few hours.  Rise time is generally between 6 and 7 AM.  She does not have night to night nocturia but does occasionally wake up with a dull, achy headache.  She does not wake up rested.  She has no family history of sleep apnea as far she knows.  She has 2 sons, 1 son snores, the other does not.  She drinks caffeine in the form of coffee, 1 cup in the morning, she is a non-smoker and does not drink any alcohol.  Her Past Medical History Is Significant For: Past Medical History:  Diagnosis Date   Anemia    Diabetes mellitus without complication ([place])    Hypertension    Insomnia    Polio    in childhood    Her Past Surgical History Is Significant For: Past Surgical History:  Procedure Laterality Date   CESAREAN SECTION     SHOULDER SURGERY Left 2010    Her Family History Is  Significant For: Family History  Family history unknown: Yes    Her Social History Is Significant For: Social History   Socioeconomic History   Marital status: Unknown    Spouse name: Not on file   Number of children: Not on file   Years of education: Not on file   Highest education level: Not on file  Occupational History   Not on file  Tobacco Use   Smoking status: Never   Smokeless tobacco: Never  Vaping Use   Vaping Use: Never used  Substance and Sexual Activity   Alcohol use: Not Currently   Drug use: Never   Sexual activity: Not Currently  Other Topics Concern   Not on file  Social History Narrative   ** Merged History Encounter **    Caffiene; 1-cup coffee daily   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    Social Determinants of Health   Financial Resource Strain: Low Risk  (04/11/2021)   Overall Financial Resource Strain (CARDIA)    Difficulty of Paying Living Expenses: Not hard at all  Food Insecurity: No Food Insecurity (04/11/2021)   Hunger Vital Sign    Worried About Running Out of Food in the Last Year: Never true    Ran Out of Food in the Last Year: Never true  Transportation Needs: No Transportation Needs (04/11/2021)   PRAPARE - Transportation  Lack of Transportation (Medical): No    Lack of Transportation (Non-Medical): No  Physical Activity: Sufficiently Active (04/11/2021)   Exercise Vital Sign    Days of Exercise per Week: 7 days    Minutes of Exercise per Session: 40 min  Stress: Stress Concern Present (04/11/2021)   [place]    Feeling of Stress : Rather much  Social Connections: Moderately Isolated (04/11/2021)   Social Connection and Isolation Panel [NHANES]    Frequency of Communication with Friends and Family: More than three times a week    Frequency of Social Gatherings with Friends and Family: More than three times a week    Attends Religious Services: More than 4  times per year    Active Member of [employer] or Organizations: No    Attends Archivist Meetings: Never    Marital Status: Divorced    Her Allergies Are:  Allergies  Allergen Reactions   Cortisone     Rash, swelling   Gabapentin Nausea Only    With dizziness   Pork-Derived Products     Rash, joint pain   Shellfish Allergy    Cortisone Rash   Shellfish Allergy Rash and Other (See Comments)    joint pain, GI pain  :   Her Current Medications Are:  Outpatient Encounter Medications as of 03/11/2022  Medication Sig   ACCU-CHEK SOFTCLIX LANCETS lancets Use as instructed to test blood glucose twice daily.   alendronate (FOSAMAX) 70 MG tablet TAKE 1 TABLET (70 MG TOTAL) BY MOUTH ONCE A WEEK. TAKE 1 TABLET (70 MG TOTAL) BY MOUTH ONCE A WEEK.   amLODipine (NORVASC) 5 MG tablet Take 0.5 tablets (2.5 mg total) by mouth daily.   atorvastatin (LIPITOR) 10 MG tablet Take 1 tablet (10 mg total) by mouth daily.   Blood Glucose Monitoring Suppl (ACCU-CHEK GUIDE ME) w/Device KIT Use as directed   CALCIUM PO Take 1 tablet by mouth daily.   diclofenac Sodium (VOLTAREN) 1 % GEL APPLY 4 G TOPICALLY 4 (FOUR) TIMES DAILY. LEFT KNEE.   dimenhyDRINATE (DRAMAMINE) 50 MG tablet Take 1 tablet (50 mg total) by mouth every 8 (eight) hours as needed.   Elastic Bandages & Supports (LUMBAR BACK BRACE/SUPPORT PAD) MISC 1 application by Does not apply route daily.   ferrous sulfate 325 (65 FE) MG tablet Take 1 tablet (325 mg total) by mouth daily with breakfast.   fluticasone (FLONASE) 50 MCG/ACT nasal spray PLACE 1 SPRAY INTO BOTH NOSTRILS DAILY AS NEEDED FOR ALLERGIES OR RHINITIS.   GEMTESA 75 MG TABS TOME UNA TABLETA TODOS LOS DIAS   glucose blood (ACCU-CHEK GUIDE) test strip Use as instructed   hydrochlorothiazide (HYDRODIURIL) 12.5 MG tablet Take 1 tablet (12.5 mg total) by mouth daily.   Iron-FA-B Cmp-C-Biot-Probiotic (FUSION PLUS) CAPS Take 1 tablet by mouth every other day. As tolerated   Lancets  Misc. (ACCU-CHEK FASTCLIX LANCET) KIT Use as directed   loratadine (CLARITIN) 10 MG tablet TOME UNA TABLETA TODOS LOS DIAS   meclizine (ANTIVERT) 12.5 MG tablet Take 1 tablet (12.5 mg total) by mouth daily as needed for dizziness.   metFORMIN (GLUCOPHAGE) 500 MG tablet TOME UNA TABLETA TODOS LOS DIAS CON EL DESAYUNO   methocarbamol (ROBAXIN) 500 MG tablet Take 1 tablet (500 mg total) by mouth 2 (two) times daily.   omeprazole (PRILOSEC) 40 MG capsule TOME UNA CAPSULA TODOS LOS DIAS   senna-docusate (SENOKOT-S) 8.6-50 MG tablet Take 1 tablet by mouth at  bedtime as needed for mild constipation.   triamcinolone cream (KENALOG) 0.1 % APLIQUE AL AREA AFECTADA DOS VECES AL DIA   Vibegron 75 MG TABS Take 1 tablet by mouth daily.   Vitamin D, Cholecalciferol, 10 MCG (400 UNIT) CHEW Chew 2 tablets (800 Units total) by mouth daily.   No facility-administered encounter medications on file as of 03/11/2022.  :   Review of Systems:  Out of a complete 14 point review of systems, all are reviewed and negative with the exception of these symptoms as listed below:  Review of Systems  Neurological:        A lot snoring, apnea.  Feels tired all the time, bags under eyes. ESS  FSS 33. No prior sleep study.     Objective:  Neurological Exam  Physical Exam Physical Examination:   Vitals:   03/11/22 1018  BP: 108/63  Pulse: 70    General Examination: The patient is a very pleasant 76 y.o. female in no acute distress. She appears well-developed and well-nourished and well groomed.   HEENT: Normocephalic, atraumatic, pupils are equal, round and reactive to light, extraocular tracking is good without limitation to gaze excursion or nystagmus noted. Hearing is grossly intact. Face is symmetric with normal facial animation. Speech is clear with no dysarthria noted. There is no hypophonia. There is no lip, neck/head, jaw or voice tremor. Neck is supple with full range of passive and active motion. There are  no carotid bruits on auscultation. Oropharynx exam reveals: mild mouth dryness, adequate dental hygiene and moderate airway crowding, due to small airway entry, tonsils on the smaller side, Mallampati class III.  Neck circumference of 14 three-quarter inches, minimal overbite.  Tongue protrudes centrally and palate elevates symmetrically.  Chest: Clear to auscultation without wheezing, rhonchi or crackles noted.  Heart: S1+S2+0, regular and normal without murmurs, rubs or gallops noted.   Abdomen: Soft, non-tender and non-distended.  Extremities: There is trace pitting edema in the distal lower extremities bilaterally, mostly around the dorsi of the feet.   Skin: Warm and dry without trophic changes noted.   Musculoskeletal: exam reveals no obvious joint deformities, tenderness or joint swelling or erythema.   Neurologically:  Mental status: The patient is awake, alert and oriented in all 4 spheres. Her immediate and remote memory, attention, language skills and fund of knowledge are appropriate. There is no evidence of aphasia, agnosia, apraxia or anomia. Speech is clear with normal prosody and enunciation. Thought process is linear. Mood is normal and affect is normal.  Cranial nerves II - XII are as described above under HEENT exam.  Motor exam: Normal bulk, strength and tone is noted. There is no obvious tremor. Fine motor skills and coordination: grossly intact.  Cerebellar testing: No dysmetria or intention tremor. There is no truncal or gait ataxia.  Sensory exam: intact to light touch in the upper and lower extremities.  Gait, station and balance: She stands easily. No veering to one side is noted. No leaning to one side is noted. Posture is age-appropriate and stance is narrow based. Gait shows normal stride length and normal pace. No problems turning are noted.   Assessment and Plan:  In summary, HAZELINE CHARNLEY is a very pleasant 76 y.o.-year old female with an underlying  medical history of diabetes, lumbar degenerative disc disease, hypertension, anemia, history of polio, and overweight state, whose history and physical exam are concerning for sleep disordered breathing, supporting a current working diagnosis of unspecified sleep apnea, with the  main differential diagnoses of obstructive sleep apnea (OSA) versus upper airway resistance syndrome (UARS) versus central sleep apnea (CSA), or mixed sleep apnea. A laboratory attended sleep study is considered gold standard for evaluation of sleep disordered breathing and is recommended at this time and clinically justified.   I had a long chat with the patient about my findings and the diagnosis of sleep apnea, particularly OSA, its prognosis and treatment options. We talked about medical/conservative treatments, surgical interventions and non-pharmacological approaches for symptom control. I explained, in particular, the risks and ramifications of untreated moderate to severe OSA, especially with respect to developing cardiovascular disease down the road, including congestive heart failure (CHF), difficult to treat hypertension, cardiac arrhythmias (particularly A-fib), neurovascular complications including TIA, stroke and dementia. Even type 2 diabetes has, in part, been linked to untreated OSA. Symptoms of untreated OSA may include (but may not be limited to) daytime sleepiness, nocturia (i.e. frequent nighttime urination), memory problems, mood irritability and suboptimally controlled or worsening mood disorder such as depression and/or anxiety, lack of energy, lack of motivation, physical discomfort, as well as recurrent headaches, especially morning or nocturnal headaches. I recommended the following at this time: sleep study.  I outlined the differences between a laboratory attended sleep study which is considered more comprehensive and accurate over the option of a home sleep test (HST); the latter may lead to underestimation of  sleep disordered breathing in some instances and does not help with diagnosing upper airway resistance syndrome and is not accurate enough to diagnose primary central sleep apnea typically. I explained the different sleep test procedures to the patient in detail and also outlined possible surgical and non-surgical treatment options of OSA, including the use of a pressure airway pressure (PAP) device (ie CPAP, AutoPAP/APAP or BiPAP in certain circumstances), a custom-made dental device (aka oral appliance, which would require a referral to a specialist dentist or orthodontist typically, and is generally speaking not considered a good choice for patients with full dentures or edentulous state), upper airway surgical options, such as traditional UPPP (which is not considered a first-line treatment) or the Inspire device (hypoglossal nerve stimulator, which would involve a referral for consultation with an ENT surgeon, after careful selection, following inclusion criteria). I explained the PAP treatment option to the patient in detail, as this is generally considered first-line treatment.  The patient indicated that she would be willing to try PAP therapy, if the need arises. I explained the importance of being compliant with PAP treatment, not only for insurance purposes but primarily to improve patient's symptoms symptoms, and for the patient's long term health benefit, including to reduce Her cardiovascular risks longer-term.    We will pick up our discussion about the next steps and treatment options after testing.  We will keep her posted as to the test results by phone call and/or MyChart messaging where possible.  We will plan to follow-up in sleep clinic accordingly as well.  I answered all her questions today and the patient was in agreement.   I encouraged her to call with any interim questions, concerns, problems or updates or email [place] through [place].  Generally speaking, sleep test authorizations may  take up to 2 weeks, sometimes less, sometimes longer, the patient is encouraged to get in touch with [place] if they do not hear back from the sleep lab staff directly within the next 2 weeks.  Thank you very much for allowing me to participate in the care of this nice patient. If I  can be of any further assistance to you please do not hesitate to call me at [phone].  Sincerely,   Star Age, MD, PhD

## 2022-03-11 NOTE — Patient Instructions (Signed)

## 2022-03-12 DIAGNOSIS — Z1231 Encounter for screening mammogram for malignant neoplasm of breast: Secondary | ICD-10-CM | POA: Diagnosis not present

## 2022-03-19 ENCOUNTER — Other Ambulatory Visit: Payer: Self-pay | Admitting: Nurse Practitioner

## 2022-03-19 ENCOUNTER — Encounter: Payer: Self-pay | Admitting: Internal Medicine

## 2022-03-19 DIAGNOSIS — M1712 Unilateral primary osteoarthritis, left knee: Secondary | ICD-10-CM

## 2022-03-19 NOTE — Progress Notes (Signed)
I received mammogram report from [facility] mammography.  Patient had mammogram done 03/12/22.  Mammogram was normal.

## 2022-03-24 ENCOUNTER — Telehealth: Payer: Self-pay | Admitting: Neurology

## 2022-03-24 NOTE — Telephone Encounter (Signed)
[employer] pending uploaded notes on the portal  

## 2022-04-02 ENCOUNTER — Encounter: Payer: Self-pay | Admitting: Internal Medicine

## 2022-04-16 ENCOUNTER — Ambulatory Visit (INDEPENDENT_AMBULATORY_CARE_PROVIDER_SITE_OTHER): Payer: Medicare HMO

## 2022-04-16 VITALS — Ht 60.0 in | Wt 142.0 lb

## 2022-04-16 DIAGNOSIS — Z0000 Encounter for general adult medical examination without abnormal findings: Secondary | ICD-10-CM

## 2022-04-16 NOTE — Progress Notes (Cosign Needed Addendum)
Subjective:   Jodi [name] is a 76 y.o. female who presents for Medicare Annual (Subsequent) preventive examination.  Review of Systems    Virtual Visit via Telephone Note  I connected with  Jodi [name] on 04/16/22 at  3:15 PM EDT by telephone and verified that I am speaking with the correct person using two identifiers.  Location: Patient: Home Provider: Office Persons participating in the virtual visit: patient/Nurse Health Advisor   I discussed the limitations, risks, security and privacy concerns of performing an evaluation and management service by telephone and the availability of in person appointments. The patient expressed understanding and agreed to proceed.  Interactive audio and video telecommunications were attempted between this nurse and patient, however failed, due to patient having technical difficulties OR patient did not have access to video capability.  We continued and completed visit with audio only.  Some vital signs may be absent or patient reported.   Criselda Peaches, LPN  Cardiac Risk Factors include: advanced age (>29mn, >>89women);diabetes mellitus;hypertension     Objective:    Today's Vitals   04/16/22 1527  Weight: 142 lb (64.4 kg)  Height: 5' (1.524 m)   Body mass index is 27.73 kg/m.     04/16/2022    3:42 PM 05/11/2021    2:09 AM 05/11/2021    1:56 AM 04/11/2021    2:22 PM 03/06/2020    2:39 PM 01/15/2020    2:03 PM 11/17/2019   11:17 AM  Advanced Directives  Does Patient Have a Medical Advance Directive? _0  No No  Would patient like information on creating a medical advance directive? No - Patient declined No - Patient declined No - Patient declined  No - Patient declined  No - Patient declined    Current Medications (verified) Outpatient Encounter Medications as of 04/16/2022  Medication Sig   ACCU-CHEK SOFTCLIX LANCETS lancets Use as instructed to test blood glucose twice daily.   alendronate (FOSAMAX)  70 MG tablet TAKE 1 TABLET (70 MG TOTAL) BY MOUTH ONCE A WEEK. TAKE 1 TABLET (70 MG TOTAL) BY MOUTH ONCE A WEEK.   amLODipine (NORVASC) 5 MG tablet Take 0.5 tablets (2.5 mg total) by mouth daily.   atorvastatin (LIPITOR) 10 MG tablet Take 1 tablet (10 mg total) by mouth daily.   Blood Glucose Monitoring Suppl (ACCU-CHEK GUIDE ME) w/Device KIT Use as directed   CALCIUM PO Take 1 tablet by mouth daily.   diclofenac Sodium (VOLTAREN) 1 % GEL APPLY 4 G TOPICALLY 4 (FOUR) TIMES DAILY. LEFT KNEE.   dimenhyDRINATE (DRAMAMINE) 50 MG tablet Take 1 tablet (50 mg total) by mouth every 8 (eight) hours as needed.   Elastic Bandages & Supports (LUMBAR BACK BRACE/SUPPORT PAD) MISC 1 application by Does not apply route daily.   ferrous sulfate 325 (65 FE) MG tablet Take 1 tablet (325 mg total) by mouth daily with breakfast.   fluticasone (FLONASE) 50 MCG/ACT nasal spray PLACE 1 SPRAY INTO BOTH NOSTRILS DAILY AS NEEDED FOR ALLERGIES OR RHINITIS.   GEMTESA 75 MG TABS TOME UNA TABLETA TODOS LOS DIAS   glucose blood (ACCU-CHEK GUIDE) test strip Use as instructed   hydrochlorothiazide (HYDRODIURIL) 12.5 MG tablet Take 1 tablet (12.5 mg total) by mouth daily.   Iron-FA-B Cmp-C-Biot-Probiotic (FUSION PLUS) CAPS Take 1 tablet by mouth every other day. As tolerated   Lancets Misc. (ACCU-CHEK FASTCLIX LANCET) KIT Use as directed   loratadine (CLARITIN) 10 MG tablet TOME UNA TABLETA TODOS  LOS DIAS   meclizine (ANTIVERT) 12.5 MG tablet Take 1 tablet (12.5 mg total) by mouth daily as needed for dizziness.   metFORMIN (GLUCOPHAGE) 500 MG tablet TOME UNA TABLETA TODOS LOS DIAS CON EL DESAYUNO   methocarbamol (ROBAXIN) 500 MG tablet Take 1 tablet (500 mg total) by mouth 2 (two) times daily.   omeprazole (PRILOSEC) 40 MG capsule TOME UNA CAPSULA TODOS LOS DIAS   senna-docusate (SENOKOT-S) 8.6-50 MG tablet Take 1 tablet by mouth at bedtime as needed for mild constipation.   triamcinolone cream (KENALOG) 0.1 % APLIQUE AL AREA  AFECTADA DOS VECES AL DIA   Vibegron 75 MG TABS Take 1 tablet by mouth daily.   Vitamin D, Cholecalciferol, 10 MCG (400 UNIT) CHEW Chew 2 tablets (800 Units total) by mouth daily.   No facility-administered encounter medications on file as of 04/16/2022.    Allergies (verified) Cortisone, Gabapentin, Pork-derived products, Shellfish allergy, Cortisone, and Shellfish allergy   History: Past Medical History:  Diagnosis Date   Anemia    Diabetes mellitus without complication ([place])    Hypertension    Insomnia    Polio    in childhood   Past Surgical History:  Procedure Laterality Date   CESAREAN SECTION     SHOULDER SURGERY Left 2010   Family History  Family history unknown: Yes   Social History   Socioeconomic History   Marital status: Unknown    Spouse name: Not on file   Number of children: Not on file   Years of education: Not on file   Highest education level: Not on file  Occupational History   Not on file  Tobacco Use   Smoking status: Never   Smokeless tobacco: Never  Vaping Use   Vaping Use: Never used  Substance and Sexual Activity   Alcohol use: Not Currently   Drug use: Never   Sexual activity: Not Currently  Other Topics Concern   Not on file  Social History Narrative   ** Merged History Encounter **    Caffiene; 1-cup coffee daily   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    Social Determinants of Health   Financial Resource Strain: Low Risk  (04/16/2022)   Overall Financial Resource Strain (CARDIA)    Difficulty of Paying Living Expenses: Not hard at all  Food Insecurity: No Food Insecurity (04/16/2022)   Hunger Vital Sign    Worried About Running Out of Food in the Last Year: Never true    Ran Out of Food in the Last Year: Never true  Transportation Needs: No Transportation Needs (04/16/2022)   PRAPARE - Hydrologist (Medical): No    Lack of Transportation (Non-Medical): No  Physical Activity: Sufficiently  Active (04/16/2022)   Exercise Vital Sign    Days of Exercise per Week: 5 days    Minutes of Exercise per Session: 60 min  Stress: No Stress Concern Present (04/16/2022)   [place]    Feeling of Stress : Not at all  Social Connections: Moderately Integrated (04/16/2022)   Social Connection and Isolation Panel [NHANES]    Frequency of Communication with Friends and Family: More than three times a week    Frequency of Social Gatherings with Friends and Family: More than three times a week    Attends Religious Services: More than 4 times per year    Active Member of [employer] or Organizations: Yes    Attends [employer]  or Organization Meetings: More than 4 times per year    Marital Status: Divorced    Tobacco Counseling Counseling given: Not Answered   Clinical Intake:  Pre-visit preparation completed: NoNutrition Risk Assessment:  Has the patient had any N/V/D within the last 2 months?  No  Does the patient have any non-healing wounds?  No  Has the patient had any unintentional weight loss or weight gain?  No   Diabetes:  Is the patient diabetic?  Yes  If diabetic, was a CBG obtained today?  No  Did the patient bring in their glucometer from home?  No  How often do you monitor your CBG's? Every other day.   Financial Strains and Diabetes Management:  Are you having any financial strains with the device, your supplies or your medication? No .  Does the patient want to be seen by Chronic Care Management for management of their diabetes?  No  Would the patient like to be referred to a Nutritionist or for Diabetic Management?  No   Diabetic Exams:  Diabetic Eye Exam: Completed No. Overdue for diabetic eye exam. Pt has been advised about the importance in completing this exam. A referral has been placed today. Message sent to referral coordinator for scheduling purposes. Advised pt to expect a call from office referred to  regarding appt.  Diabetic Foot Exam: Completed No. Pt has been advised about the importance in completing this exam. Pt is scheduled for diabetic foot exam on Followed by PCP.    Pain : No/denies pain Diabetic?  Yes  Interpreter Needed?: Yes Interpreter Name: Jodi [name] Interpreter ID: [id number] Patient Declined Interpreter : No Patient signed [place] waiver: No  Activities of Daily Living    04/16/2022    3:36 PM  In your present state of health, do you have any difficulty performing the following activities:  Hearing? 1  Comment Wears hearing aids  Vision? 0  Difficulty concentrating or making decisions? 0  Walking or climbing stairs? 0  Dressing or bathing? 0  Doing errands, shopping? 0  Preparing Food and eating ? N  Using the Toilet? N  In the past six months, have you accidently leaked urine? Y  Comment Followed by Urologist  Do you have problems with loss of bowel control? N  Managing your Medications? N  Managing your Finances? N  Housekeeping or managing your Housekeeping? N    Patient Care Team: [place], Betty G, MD as PCP - General (Family Medicine)  Indicate any recent Medical Services you may have received from other than [facility] providers in the past year (date may be approximate).     Assessment:   This is a routine wellness examination for [facility].  Hearing/Vision screen Hearing Screening - Comments:: Wears hearing aids Vision Screening - Comments:: Wears rx glasses - up to date with routine eye exams with  [place] issues and exercise activities discussed: Current Exercise Habits: Home exercise routine, Type of exercise: walking, Time (Minutes): 60, Frequency (Times/Week): 5, Weekly Exercise (Minutes/Week): 300, Intensity: Moderate, Exercise limited by: None identified   Goals Addressed               This Visit's Progress     No current goals (pt-stated)         Depression Screen    04/16/2022    3:35 PM 04/16/2022    3:34 PM  01/16/2022    8:56 AM 07/08/2021    2:46 PM 06/05/2021   10:49 AM 05/28/2021  3:12 PM 05/09/2021    4:23 PM  PHQ 2/9 Scores  PHQ - 2 Score 0 0 _0 PHQ- 9 Score   _1 Fall Risk    04/16/2022    3:39 PM 01/16/2022    8:56 AM 06/05/2021   10:49 AM 05/09/2021    4:23 PM 04/11/2021    2:23 PM  Fall Risk   Falls in the past year? 1 1 0 1 1  Number falls in past yr: 1 1 0 0 1  Injury with Fall? 0 0 0 0 0  Risk for fall due to : No Fall Risks History of fall(s)  Other (Comment)   Follow up Falls prevention discussed Falls evaluation completed Falls evaluation completed      [place]:  Any stairs in or around the home? No  If so, are there any without handrails? No  Home free of loose throw rugs in walkways, pet beds, electrical cords, etc? Yes  Adequate lighting in your home to reduce risk of falls? Yes   ASSISTIVE DEVICES UTILIZED TO PREVENT FALLS:  Life alert? No  Use of a cane, walker or w/c? No  Grab bars in the bathroom? Yes  Shower chair or bench in shower? No  Elevated toilet seat or a handicapped toilet? No   TIMED UP AND GO:  Was the test performed? No . Audio Visit   Cognitive Function:        04/16/2022    3:42 PM 04/11/2021    2:25 PM  6CIT Screen  What Year? 0 points 0 points  What month? 0 points 0 points  What time? 0 points 0 points  Count back from 20 0 points 0 points  Months in reverse 0 points 0 points  Repeat phrase 0 points 0 points  Total Score 0 points 0 points    Immunizations Immunization History  Administered Date(s) Administered   Influenza,inj,Quad PF,6+ Mos 04/02/2016, 03/16/2017, 04/08/2018, 05/09/2021   PFIZER(Purple Top)SARS-COV-2 Vaccination 09/09/2019, 09/13/2019, 10/02/2019   Pneumococcal Conjugate-13 09/28/2017   Pneumococcal Polysaccharide-23 04/02/2016   Tdap 04/02/2016   Zoster Recombinat (Shingrix) 01/07/2021, 07/08/2021    TDAP status: Up to date  Flu Vaccine  status: Up to date  Pneumococcal vaccine status: Up to date  Covid-19 vaccine status: Completed vaccines  Qualifies for Shingles Vaccine? Yes   Zostavax completed Yes   Shingrix Completed?: Yes  Screening Tests Health Maintenance  Topic Date Due   FOOT EXAM  04/02/2017   OPHTHALMOLOGY EXAM  05/12/2017   Diabetic kidney evaluation - Urine ACR  01/07/2022   COVID-19 Vaccine (4 - Pfizer series) 05/02/2022 (Originally 11/27/2019)   INFLUENZA VACCINE  10/18/2022 (Originally 02/17/2022)   HEMOGLOBIN A1C  07/18/2022   Diabetic kidney evaluation - GFR measurement  12/20/2022   TETANUS/TDAP  04/02/2026   Pneumonia Vaccine 63+ Years old  Completed   DEXA SCAN  Completed   Hepatitis C Screening  Completed   Zoster Vaccines- Shingrix  Completed   HPV VACCINES  Aged Out   COLONOSCOPY (Pts 45-78yr Insurance coverage will need to be confirmed)  Discontinued    Health Maintenance  Health Maintenance Due  Topic Date Due   FOOT EXAM  04/02/2017   OPHTHALMOLOGY EXAM  05/12/2017   Diabetic kidney evaluation - Urine ACR  01/07/2022    Colorectal cancer screening: No longer required.   Mammogram status: No longer required due to Age.  Bone Density status: Completed 12/31/16. Results reflect: Bone density results: OSTEOPOROSIS. Repeat every   years.  Lung Cancer Screening: (Low Dose CT Chest recommended if Age 46-80 years, 30 pack-year currently smoking OR have quit w/in 15years.) does not qualify.     Additional Screening:  Hepatitis C Screening: does qualify; Completed 04/02/16  Vision Screening: Recommended annual ophthalmology exams for early detection of glaucoma and other disorders of the eye. Is the patient up to date with their annual eye exam?  Yes  Who is the provider or what is the name of the office in which the patient attends annual eye exams? [place] If pt is not established with a provider, would they like to be referred to a provider to establish care? No .    Dental Screening: Recommended annual dental exams for proper oral hygiene  Community Resource Referral / Chronic Care Management:  CRR required this visit?  No   CCM required this visit?  No      Plan:     I have personally reviewed and noted the following in the patient's chart:   Medical and social history Use of alcohol, tobacco or illicit drugs  Current medications and supplements including opioid prescriptions. Patient is not currently taking opioid prescriptions. Functional ability and status Nutritional status Physical activity Advanced directives List of other physicians Hospitalizations, surgeries, and ER visits in previous 12 months Vitals Screenings to include cognitive, depression, and falls Referrals and appointments  In addition, I have reviewed and discussed with patient certain preventive protocols, quality metrics, and best practice recommendations. A written personalized care plan for preventive services as well as general preventive health recommendations were provided to patient.     Criselda Peaches, LPN   01/21/8888   Nurse Notes: None

## 2022-04-16 NOTE — Patient Instructions (Addendum)
[name] , Thank you for taking time to come for your Medicare Wellness Visit. I appreciate your ongoing commitment to your health goals. Please review the following plan we discussed and let me know if I can assist you in the future.   These are the goals we discussed:  Goals       No current goals (pt-stated)        This is a list of the screening recommended for you and due dates:  Health Maintenance  Topic Date Due   Complete foot exam   04/02/2017   Eye exam for diabetics  05/12/2017   Yearly kidney health urinalysis for diabetes  01/07/2022   COVID-19 Vaccine (4 - Pfizer series) 05/02/2022*   Flu Shot  10/18/2022*   Hemoglobin A1C  07/18/2022   Yearly kidney function blood test for diabetes  12/20/2022   Tetanus Vaccine  04/02/2026   Pneumonia Vaccine  Completed   DEXA scan (bone density measurement)  Completed   Hepatitis C Screening: USPSTF Recommendation to screen - Ages 75-79 yo.  Completed   Zoster (Shingles) Vaccine  Completed   HPV Vaccine  Aged Out   Colon Cancer Screening  Discontinued  *Topic was postponed. The date shown is not the original due date.    Advanced directives: Advance directive discussed with you today. Even though you declined this today, please call our office should you change your mind, and we can give you the proper paperwork for you to fill out.   Conditions/risks identified: None  Next appointment: Follow up in one year for your annual wellness visit    Preventive Care 65 Years and Older, Female Preventive care refers to lifestyle choices and visits with your health care provider that can promote health and wellness. What does preventive care include? A yearly physical exam. This is also called an annual well check. Dental exams once or twice a year. Routine eye exams. Ask your health care provider how often you should have your eyes checked. Personal lifestyle choices, including: Daily care of your teeth and gums. Regular  physical activity. Eating a healthy diet. Avoiding tobacco and drug use. Limiting alcohol use. Practicing safe sex. Taking low-dose aspirin every day. Taking vitamin and mineral supplements as recommended by your health care provider. What happens during an annual well check? The services and screenings done by your health care provider during your annual well check will depend on your age, overall health, lifestyle risk factors, and family history of disease. Counseling  Your health care provider may ask you questions about your: Alcohol use. Tobacco use. Drug use. Emotional well-being. Home and relationship well-being. Sexual activity. Eating habits. History of falls. Memory and ability to understand (cognition). Work and work Statistician. Reproductive health. Screening  You may have the following tests or measurements: Height, weight, and BMI. Blood pressure. Lipid and cholesterol levels. These may be checked every 5 years, or more frequently if you are over 69 years old. Skin check. Lung cancer screening. You may have this screening every year starting at age 52 if you have a 30-pack-year history of smoking and currently smoke or have quit within the past 15 years. Fecal occult blood test (FOBT) of the stool. You may have this test every year starting at age 42. Flexible sigmoidoscopy or colonoscopy. You may have a sigmoidoscopy every 5 years or a colonoscopy every 10 years starting at age 30. Hepatitis C blood test. Hepatitis B blood test. Sexually transmitted disease (STD) testing. Diabetes screening. This  is done by checking your blood sugar (glucose) after you have not eaten for a while (fasting). You may have this done every 1-3 years. Bone density scan. This is done to screen for osteoporosis. You may have this done starting at age 52. Mammogram. This may be done every 1-2 years. Talk to your health care provider about how often you should have regular mammograms. Talk  with your health care provider about your test results, treatment options, and if necessary, the need for more tests. Vaccines  Your health care provider may recommend certain vaccines, such as: Influenza vaccine. This is recommended every year. Tetanus, diphtheria, and acellular pertussis (Tdap, Td) vaccine. You may need a Td booster every 10 years. Zoster vaccine. You may need this after age 61. Pneumococcal 13-valent conjugate (PCV13) vaccine. One dose is recommended after age 39. Pneumococcal polysaccharide (PPSV23) vaccine. One dose is recommended after age 67. Talk to your health care provider about which screenings and vaccines you need and how often you need them. This information is not intended to replace advice given to you by your health care provider. Make sure you discuss any questions you have with your health care provider. Document Released: 08/02/2015 Document Revised: 03/25/2016 Document Reviewed: 05/07/2015 Elsevier Interactive Patient Education  2017 [place] Prevention in the Home Falls can cause injuries. They can happen to people of all ages. There are many things you can do to make your home safe and to help prevent falls. What can I do on the outside of my home? Regularly fix the edges of walkways and driveways and fix any cracks. Remove anything that might make you trip as you walk through a door, such as a raised step or threshold. Trim any bushes or trees on the path to your home. Use bright outdoor lighting. Clear any walking paths of anything that might make someone trip, such as rocks or tools. Regularly check to see if handrails are loose or broken. Make sure that both sides of any steps have handrails. Any raised decks and porches should have guardrails on the edges. Have any leaves, snow, or ice cleared regularly. Use sand or salt on walking paths during winter. Clean up any spills in your garage right away. This includes oil or grease  spills. What can I do in the bathroom? Use night lights. Install grab bars by the toilet and in the tub and shower. Do not use towel bars as grab bars. Use non-skid mats or decals in the tub or shower. If you need to sit down in the shower, use a plastic, non-slip stool. Keep the floor dry. Clean up any water that spills on the floor as soon as it happens. Remove soap buildup in the tub or shower regularly. Attach bath mats securely with double-sided non-slip rug tape. Do not have throw rugs and other things on the floor that can make you trip. What can I do in the bedroom? Use night lights. Make sure that you have a light by your bed that is easy to reach. Do not use any sheets or blankets that are too big for your bed. They should not hang down onto the floor. Have a firm chair that has side arms. You can use this for support while you get dressed. Do not have throw rugs and other things on the floor that can make you trip. What can I do in the kitchen? Clean up any spills right away. Avoid walking on wet floors. Keep items that  you use a lot in easy-to-reach places. If you need to reach something above you, use a strong step stool that has a grab bar. Keep electrical cords out of the way. Do not use floor polish or wax that makes floors slippery. If you must use wax, use non-skid floor wax. Do not have throw rugs and other things on the floor that can make you trip. What can I do with my stairs? Do not leave any items on the stairs. Make sure that there are handrails on both sides of the stairs and use them. Fix handrails that are broken or loose. Make sure that handrails are as long as the stairways. Check any carpeting to make sure that it is firmly attached to the stairs. Fix any carpet that is loose or worn. Avoid having throw rugs at the top or bottom of the stairs. If you do have throw rugs, attach them to the floor with carpet tape. Make sure that you have a light switch at the  top of the stairs and the bottom of the stairs. If you do not have them, ask someone to add them for you. What else can I do to help prevent falls? Wear shoes that: Do not have high heels. Have rubber bottoms. Are comfortable and fit you well. Are closed at the toe. Do not wear sandals. If you use a stepladder: Make sure that it is fully opened. Do not climb a closed stepladder. Make sure that both sides of the stepladder are locked into place. Ask someone to hold it for you, if possible. Clearly mark and make sure that you can see: Any grab bars or handrails. First and last steps. Where the edge of each step is. Use tools that help you move around (mobility aids) if they are needed. These include: Canes. Walkers. Scooters. Crutches. Turn on the lights when you go into a dark area. Replace any light bulbs as soon as they burn out. Set up your furniture so you have a clear path. Avoid moving your furniture around. If any of your floors are uneven, fix them. If there are any pets around you, be aware of where they are. Review your medicines with your doctor. Some medicines can make you feel dizzy. This can increase your chance of falling. Ask your doctor what other things that you can do to help prevent falls. This information is not intended to replace advice given to you by your health care provider. Make sure you discuss any questions you have with your health care provider. Document Released: 05/02/2009 Document Revised: 12/12/2015 Document Reviewed: 08/10/2014 Elsevier Interactive Patient Education  2017 [employer].

## 2022-04-30 ENCOUNTER — Telehealth: Payer: Self-pay

## 2022-04-30 NOTE — Telephone Encounter (Signed)
Unable to LVM for pt to call back to schedule sleep study. Will try patient again in a few days.

## 2022-05-07 NOTE — Telephone Encounter (Signed)
VMail was left on 04/30/22  Craig Staggers: [id number] (exp. 03/24/22 to 06/22/22)

## 2022-06-01 DIAGNOSIS — H524 Presbyopia: Secondary | ICD-10-CM | POA: Diagnosis not present

## 2022-06-01 DIAGNOSIS — H52209 Unspecified astigmatism, unspecified eye: Secondary | ICD-10-CM | POA: Diagnosis not present

## 2022-06-01 DIAGNOSIS — H5203 Hypermetropia, bilateral: Secondary | ICD-10-CM | POA: Diagnosis not present

## 2022-07-17 NOTE — Progress Notes (Signed)
HPI: [name] is a 76 y.o. female, who is here today for follow up.  DM II: Dx'ed in 2015. 11/22/13 HgA1C 6.5 and on 01/15/20.  She is currently taking metformin 500 mg daily.  She mentions that her blood sugar levels have been consistently below 100 when checked weekly.  Her last A1C was 6.2. Negative for symptoms of hypoglycemia, polyuria, polydipsia, foot ulcers/trauma. Occasional feet numbness.  HTN: She takes amlodipine 5 mg, and hydrochlorothiazide 12.5 mg daily.  Negative for visual changes, chest pain, dyspnea, palpitation, claudication, focal weakness, or edema. She does not check BP at home.  Iron def anemia: She has a history of anemia and reports receiving blood transfusion four years ago. She is currently taking over-the-counter iron supplements daily.  H/H 11.5/35.5 in 12/2021. She has not noted melena, blood in stool, or changes in bowel habits.  HLD on Atorvastatin 10 mg daily. She is tolerating medication well.  She also has a previous diagnosis of depression, which she attributes to family issues, daughter-in-law. She does not believe she needs medication for her depression at this time.    07/21/2022    9:03 AM 04/16/2022    3:35 PM 04/16/2022    3:34 PM 01/16/2022    8:56 AM 07/08/2021    2:46 PM  Depression screen PHQ 2/9  Decreased Interest 2 0 0 2 3  Down, Depressed, Hopeless 3 0 0 1 3  PHQ - 2 Score 5 0 0 3 6  Altered sleeping 3   3 3   Tired, decreased energy 3   2 2   Change in appetite 0   0 2  Feeling bad or failure about yourself  0   0 2  Trouble concentrating 2   2 1   Moving slowly or fidgety/restless 0   2 1  Suicidal thoughts 0   0 1  PHQ-9 Score 13   12 18   Difficult doing work/chores Somewhat difficult   Somewhat difficult Somewhat difficult   Today she is c/o involuntary movement in her left leg at night for the past month, which wakes her up. She denies any urinary/bowel incontinence, generalized movement,or post ictal like  symptoms. This problem interferes with sleep. She denies severe/frequent headache, fever, night sweats,or abnormal wt loss.  She also reports right-sided back that radiates to left flank and down her LLE. Problem is worse in the morning. She has been taking ibuprofen for the pain. Negative for saddle anesthesia, bowel/bladder dysfunction. She has had problem intermittent for a while. Pain is exacerbated by certain movements and alleviated by rest. No hx of trauma.  Review of Systems  Constitutional:  Negative for activity change and appetite change.  HENT:  Negative for mouth sores and nosebleeds.   Respiratory:  Negative for cough and wheezing.   Gastrointestinal:  Negative for abdominal pain, nausea and vomiting.  Endocrine: Negative for cold intolerance and heat intolerance.  Genitourinary:  Negative for decreased urine volume, dysuria and hematuria.  Neurological:  Negative for seizures, syncope and facial asymmetry.  Psychiatric/Behavioral:  Negative for confusion. The patient is nervous/anxious.   See other pertinent positives and negatives in HPI.  Current Outpatient Medications on File Prior to Visit  Medication Sig Dispense Refill   ACCU-CHEK SOFTCLIX LANCETS lancets Use as instructed to test blood glucose twice daily. 100 each 12   alendronate (FOSAMAX) 70 MG tablet TAKE 1 TABLET (70 MG TOTAL) BY MOUTH ONCE A WEEK. TAKE 1 TABLET (70 MG TOTAL) BY MOUTH ONCE A WEEK.  13 tablet 3   amLODipine (NORVASC) 5 MG tablet Take 0.5 tablets (2.5 mg total) by mouth daily. 45 tablet 3   atorvastatin (LIPITOR) 10 MG tablet Take 1 tablet (10 mg total) by mouth daily. 90 tablet 2   Blood Glucose Monitoring Suppl (ACCU-CHEK GUIDE ME) w/Device KIT Use as directed 1 kit 0   CALCIUM PO Take 1 tablet by mouth daily.     diclofenac Sodium (VOLTAREN) 1 % GEL APPLY 4 G TOPICALLY 4 (FOUR) TIMES DAILY. LEFT KNEE. 100 g 0   dimenhyDRINATE (DRAMAMINE) 50 MG tablet Take 1 tablet (50 mg total) by mouth every 8  (eight) hours as needed. 30 tablet 0   Elastic Bandages & Supports (LUMBAR BACK BRACE/SUPPORT PAD) MISC 1 application by Does not apply route daily. 1 each 0   ferrous sulfate 325 (65 FE) MG tablet Take 1 tablet (325 mg total) by mouth daily with breakfast. 90 tablet 1   fluticasone (FLONASE) 50 MCG/ACT nasal spray PLACE 1 SPRAY INTO BOTH NOSTRILS DAILY AS NEEDED FOR ALLERGIES OR RHINITIS. 48 mL 1   GEMTESA 75 MG TABS TOME UNA TABLETA TODOS LOS DIAS 30 tablet 11   glucose blood (ACCU-CHEK GUIDE) test strip Use as instructed 100 each 12   hydrochlorothiazide (HYDRODIURIL) 12.5 MG tablet TOME UNA TABLETA TODOS LOS DIAS 90 tablet 1   Iron-FA-B Cmp-C-Biot-Probiotic (FUSION PLUS) CAPS Take 1 tablet by mouth every other day. As tolerated 90 capsule 0   Lancets Misc. (ACCU-CHEK FASTCLIX LANCET) KIT Use as directed 1 kit 6   loratadine (CLARITIN) 10 MG tablet TOME UNA TABLETA TODOS LOS DIAS 90 tablet 1   meclizine (ANTIVERT) 12.5 MG tablet Take 1 tablet (12.5 mg total) by mouth daily as needed for dizziness. 30 tablet 0   omeprazole (PRILOSEC) 40 MG capsule TOME UNA CAPSULA TODOS LOS DIAS 90 capsule 2   senna-docusate (SENOKOT-S) 8.6-50 MG tablet Take 1 tablet by mouth at bedtime as needed for mild constipation. 120 tablet 3   triamcinolone cream (KENALOG) 0.1 % APLIQUE AL AREA AFECTADA DOS VECES AL DIA 30 g 1   Vibegron 75 MG TABS Take 1 tablet by mouth daily. 30 tablet 5   Vitamin D, Cholecalciferol, 10 MCG (400 UNIT) CHEW Chew 2 tablets (800 Units total) by mouth daily. 60 tablet 6   No current facility-administered medications on file prior to visit.   Past Medical History:  Diagnosis Date   Anemia    Diabetes mellitus without complication (HCC)    Hypertension    Insomnia    Polio    in childhood   Allergies  Allergen Reactions   Cortisone     Rash, swelling   Gabapentin Nausea Only    With dizziness   Pork-Derived Products     Rash, joint pain   Shellfish Allergy    Cortisone Rash    Shellfish Allergy Rash and Other (See Comments)    joint pain, GI pain    Social History   Socioeconomic History   Marital status: Unknown    Spouse name: Not on file   Number of children: Not on file   Years of education: Not on file   Highest education level: Not on file  Occupational History   Not on file  Tobacco Use   Smoking status: Never   Smokeless tobacco: Never  Vaping Use   Vaping Use: Never used  Substance and Sexual Activity   Alcohol use: Not Currently   Drug use: Never   Sexual  activity: Not Currently  Other Topics Concern   Not on file  Social History Narrative   ** Merged History Encounter **    Caffiene; 1-cup coffee daily   Working: no retired   Programme researcher, broadcasting/film/video; Human resources officer.    Social Determinants of Health   Financial Resource Strain: Low Risk  (04/16/2022)   Overall Financial Resource Strain (CARDIA)    Difficulty of Paying Living Expenses: Not hard at all  Food Insecurity: No Food Insecurity (04/16/2022)   Hunger Vital Sign    Worried About Running Out of Food in the Last Year: Never true    Ran Out of Food in the Last Year: Never true  Transportation Needs: No Transportation Needs (04/16/2022)   PRAPARE - Administrator, Civil Service (Medical): No    Lack of Transportation (Non-Medical): No  Physical Activity: Sufficiently Active (04/16/2022)   Exercise Vital Sign    Days of Exercise per Week: 5 days    Minutes of Exercise per Session: 60 min  Stress: No Stress Concern Present (04/16/2022)   [employer] of Occupational Health - Occupational Stress Questionnaire    Feeling of Stress : Not at all  Social Connections: Moderately Integrated (04/16/2022)   Social Connection and Isolation Panel [NHANES]    Frequency of Communication with Friends and Family: More than three times a week    Frequency of Social Gatherings with Friends and Family: More than three times a week    Attends Religious Services: More than 4 times per year     Active Member of Clubs or Organizations: Yes    Attends Banker Meetings: More than 4 times per year    Marital Status: Divorced   Vitals:   07/21/22 0858  BP: 126/70  Pulse: 86  Resp: 16  Temp: 98.2 F (36.8 C)  SpO2: 97%   Body mass index is 28.22 kg/m.  Physical Exam Vitals and nursing note reviewed.  Constitutional:      General: She is not in acute distress.    Appearance: She is well-developed.  HENT:     Head: Normocephalic and atraumatic.     Mouth/Throat:     Mouth: Mucous membranes are moist.     Pharynx: Oropharynx is clear.  Eyes:     Conjunctiva/sclera: Conjunctivae normal.  Cardiovascular:     Rate and Rhythm: Normal rate and regular rhythm.     Pulses:          Dorsalis pedis pulses are 2+ on the right side and 2+ on the left side.     Heart sounds: No murmur heard. Pulmonary:     Effort: Pulmonary effort is normal. No respiratory distress.     Breath sounds: Normal breath sounds.  Abdominal:     Palpations: Abdomen is soft. There is no hepatomegaly or mass.     Tenderness: There is no abdominal tenderness.  Musculoskeletal:     Lumbar back: Tenderness present. Negative right straight leg raise test and negative left straight leg raise test.       Back:  Lymphadenopathy:     Cervical: No cervical adenopathy.  Skin:    General: Skin is warm.     Findings: No erythema or rash.  Neurological:     General: No focal deficit present.     Mental Status: She is alert and oriented to person, place, and time.     Cranial Nerves: No cranial nerve deficit.     Gait: Gait normal.  Psychiatric:  Mood and Affect: Mood is anxious.     Comments: Well groomed, good eye contact.    Diabetic Foot Exam - Simple   Simple Foot Form Diabetic Foot exam was performed with the following findings: Yes 07/21/2022  9:44 AM  Visual Inspection See comments: Yes Sensation Testing Intact to touch and monofilament testing bilaterally: Yes Pulse  Check Posterior Tibialis and Dorsalis pulse intact bilaterally: Yes Comments Bunions and pronounce arch, bilateral. Small calluses.    ASSESSMENT AND PLAN:  Jodi [name] was seen today for follow-up.  Diagnoses and all orders for this visit: Lab Results  Component Value Date   HGBA1C 6.1 07/21/2022   Lab Results  Component Value Date   WBC 5.5 07/21/2022   HGB 12.6 07/21/2022   HCT 38.0 07/21/2022   MCV 91.0 07/21/2022   PLT 385.0 07/21/2022   Lab Results  Component Value Date   CREATININE 0.57 07/21/2022   BUN 22 07/21/2022   NA 140 07/21/2022   K 3.6 07/21/2022   CL 103 07/21/2022   CO2 29 07/21/2022   Lab Results  Component Value Date   CHOL 184 07/21/2022   HDL 72.60 07/21/2022   LDLCALC 91 07/21/2022   TRIG 100.0 07/21/2022   CHOLHDL 3 07/21/2022   Lab Results  Component Value Date   ALT 19 07/21/2022   AST 23 07/21/2022   ALKPHOS 84 07/21/2022   BILITOT 0.7 07/21/2022   Lab Results  Component Value Date   LABMICR 6.1 01/07/2021   LABMICR <3.0 01/15/2020   LABMICR Comment 01/15/2020   MICROALBUR <0.7 07/21/2022   Type 2 diabetes mellitus with other specified complication, without long-term current use of insulin (HCC) Assessment & Plan: HgA1C at goal, it went from 6.2 to 6.1. She would like to try non pharmacologic treatment, so discontinue Metformin 500 mg daily.  Regular exercise and healthy diet with avoidance of added sugar food intake is an important part of treatment and encouraged. Annual eye exam, periodic dental and foot care recommended. F/U in 3-4 months.  Orders: -     Microalbumin / creatinine urine ratio; Future -     POCT glycosylated hemoglobin (Hb A1C)  Mild recurrent major depression (HCC) Assessment & Plan: Attributed to not getting along with daughter-in-law. She does not think she needs medication at this time.   Chronic left-sided low back pain with left-sided sciatica Assessment & Plan: She agrees with Gabapentin 100  mg at bedtime. Some side effects discussed. Instead Ibuprofen she can try Meloxicam 7.5 mg daily prn. Side effects discussed. If not improved, we will consider lumbar MRI.  Orders: -     Meloxicam; Take 1 tablet (7.5 mg total) by mouth daily as needed for pain.  Dispense: 30 tablet; Refill: 1 -     Gabapentin; Take 1 capsule (100 mg total) by mouth at bedtime.  Dispense: 30 capsule; Refill: 0  Pure hypercholesterolemia Assessment & Plan: LDL 131 in 12/2020. Continue Atorvastatin 10 mg daily and low fat diet. Further recommendations according to lipid panel result.  Orders: -     Comprehensive metabolic panel; Future -     Lipid panel; Future  Iron deficiency anemia due to chronic blood loss Assessment & Plan: Mild. Continue OTC iron supplementation. Further recommendations according to lab results. Colonoscopy in 07/2016.  Orders: -     CBC; Future  HTN (hypertension), benign Assessment & Plan: BP adequately controlled. Continue Amlodipine and HCTZ same dose as well as low salt diet. Monitor    Abnormal leg  movement Assessment & Plan: We discussed possible etiologies. Hx and examination do not suggest a serious process. ? RLS. She agrees with trying Gabapentin again, it caused nausea in the past. She will try Gabapentin 100 mg at bedtime, if well tolerated we can titrate up. Instructed about warning signs.   I spent a total of 45 minutes in both face to face and non face to face activities for this visit on the date of this encounter. During this time history was obtained and documented, examination was performed, prior labs reviewed, and assessment/plan discussed.  Return in about 15 weeks (around 11/03/2022) for chronic problems.  Georgeanna Radziewicz G. [place], MD  [facility]. [place] [facility].

## 2022-07-20 ENCOUNTER — Other Ambulatory Visit: Payer: Self-pay | Admitting: Family Medicine

## 2022-07-20 DIAGNOSIS — I10 Essential (primary) hypertension: Secondary | ICD-10-CM

## 2022-07-21 ENCOUNTER — Encounter: Payer: Self-pay | Admitting: Family Medicine

## 2022-07-21 ENCOUNTER — Ambulatory Visit: Payer: Medicare HMO | Admitting: Internal Medicine

## 2022-07-21 ENCOUNTER — Ambulatory Visit (INDEPENDENT_AMBULATORY_CARE_PROVIDER_SITE_OTHER): Payer: Medicare HMO | Admitting: Family Medicine

## 2022-07-21 VITALS — BP 126/70 | HR 86 | Temp 98.2°F | Resp 16 | Ht 60.0 in | Wt 144.5 lb

## 2022-07-21 DIAGNOSIS — M5442 Lumbago with sciatica, left side: Secondary | ICD-10-CM

## 2022-07-21 DIAGNOSIS — G259 Extrapyramidal and movement disorder, unspecified: Secondary | ICD-10-CM

## 2022-07-21 DIAGNOSIS — F330 Major depressive disorder, recurrent, mild: Secondary | ICD-10-CM

## 2022-07-21 DIAGNOSIS — I10 Essential (primary) hypertension: Secondary | ICD-10-CM

## 2022-07-21 DIAGNOSIS — E7800 Pure hypercholesterolemia, unspecified: Secondary | ICD-10-CM | POA: Diagnosis not present

## 2022-07-21 DIAGNOSIS — G8929 Other chronic pain: Secondary | ICD-10-CM | POA: Insufficient documentation

## 2022-07-21 DIAGNOSIS — D500 Iron deficiency anemia secondary to blood loss (chronic): Secondary | ICD-10-CM | POA: Diagnosis not present

## 2022-07-21 DIAGNOSIS — E1169 Type 2 diabetes mellitus with other specified complication: Secondary | ICD-10-CM | POA: Diagnosis not present

## 2022-07-21 LAB — CBC
HCT: 38 % (ref 36.0–46.0)
Hemoglobin: 12.6 g/dL (ref 12.0–15.0)
MCHC: 33.2 g/dL (ref 30.0–36.0)
MCV: 91 fl (ref 78.0–100.0)
Platelets: 385 10*3/uL (ref 150.0–400.0)
RBC: 4.17 Mil/uL (ref 3.87–5.11)
RDW: 15 % (ref 11.5–15.5)
WBC: 5.5 10*3/uL (ref 4.0–10.5)

## 2022-07-21 LAB — POCT GLYCOSYLATED HEMOGLOBIN (HGB A1C): HbA1c, POC (prediabetic range): 6.1 % (ref 5.7–6.4)

## 2022-07-21 LAB — COMPREHENSIVE METABOLIC PANEL
ALT: 19 U/L (ref 0–35)
AST: 23 U/L (ref 0–37)
Albumin: 4.2 g/dL (ref 3.5–5.2)
Alkaline Phosphatase: 84 U/L (ref 39–117)
BUN: 22 mg/dL (ref 6–23)
CO2: 29 mEq/L (ref 19–32)
Calcium: 10.1 mg/dL (ref 8.4–10.5)
Chloride: 103 mEq/L (ref 96–112)
Creatinine, Ser: 0.57 mg/dL (ref 0.40–1.20)
GFR: 86.82 mL/min (ref >60.00)
Glucose, Bld: 96 mg/dL (ref 70–99)
Potassium: 3.6 mEq/L (ref 3.5–5.1)
Sodium: 140 mEq/L (ref 135–145)
Total Bilirubin: 0.7 mg/dL (ref 0.2–1.2)
Total Protein: 7.6 g/dL (ref 6.0–8.3)

## 2022-07-21 LAB — MICROALBUMIN / CREATININE URINE RATIO
Creatinine,U: 66.4 mg/dL
Microalb Creat Ratio: 1.1 mg/g (ref 0.0–30.0)
Microalb, Ur: <0.7 mg/dL (ref 0.0–1.9)

## 2022-07-21 LAB — LIPID PANEL
Cholesterol: 184 mg/dL (ref 0–200)
HDL: 72.6 mg/dL (ref >39.00)
LDL Cholesterol: 91 mg/dL (ref 0–99)
NonHDL: 111.13
Total CHOL/HDL Ratio: 3
Triglycerides: 100 mg/dL (ref 0.0–149.0)
VLDL: 20 mg/dL (ref 0.0–40.0)

## 2022-07-21 MED ORDER — MELOXICAM 7.5 MG PO TABS: 7.5000 mg | ORAL_TABLET | Freq: Every day | ORAL | 1 refills | Status: DC | PRN

## 2022-07-21 MED ORDER — GABAPENTIN 100 MG PO CAPS: 100.0000 mg | ORAL_CAPSULE | Freq: Every day | ORAL | 0 refills | Status: DC

## 2022-07-21 NOTE — Assessment & Plan Note (Signed)
BP adequately controlled. Continue Amlodipine and HCTZ same dose as well as low salt diet. Monitor

## 2022-07-21 NOTE — Assessment & Plan Note (Signed)
We discussed possible etiologies. Hx and examination do not suggest a serious process. ? RLS. She agrees with trying Gabapentin again, it caused nausea in the past. She will try Gabapentin 100 mg at bedtime, if well tolerated we can titrate up. Instructed about warning signs.

## 2022-07-21 NOTE — Assessment & Plan Note (Addendum)
LDL 131 in 12/2020. Continue Atorvastatin 10 mg daily and low fat diet. Further recommendations according to lipid panel result.

## 2022-07-21 NOTE — Assessment & Plan Note (Signed)
HgA1C at goal, it went from 6.2 to 6.1. She would like to try non pharmacologic treatment, so discontinue Metformin 500 mg daily.  Regular exercise and healthy diet with avoidance of added sugar food intake is an important part of treatment and encouraged. Annual eye exam, periodic dental and foot care recommended. F/U in 3-4 months.

## 2022-07-21 NOTE — Assessment & Plan Note (Signed)
Attributed to not getting along with daughter-in-law. She does not think she needs medication at this time.

## 2022-07-21 NOTE — Assessment & Plan Note (Signed)
Mild. Continue OTC iron supplementation. Further recommendations according to lab results. Colonoscopy in 07/2016.

## 2022-07-21 NOTE — Patient Instructions (Addendum)
A few things to remember from today's visit:  Type 2 diabetes, controlled, with neuropathy (Miranda) - Plan: Microalbumin / creatinine urine ratio  Mild major depression, single episode (Kaycee), Chronic  Chronic left-sided low back pain with left-sided sciatica - Plan: meloxicam (MOBIC) 7.5 MG tablet, gabapentin (NEURONTIN) 100 MG capsule  Pure hypercholesterolemia - Plan: Comprehensive metabolic panel, Lipid panel  Iron deficiency anemia, unspecified iron deficiency anemia type - Plan: CBC Hoy empezamos Gabapentin para tomarlo diario a la hora de la cama. Es para Conservation officer, historic buildings de espalda y la pierna. Hagame saber so lo tolero bien para continuarlo.  No ibuprofen. Meloxicam diaria cuando tenga dolor. Pare la metformina.  If you need refills for medications you take chronically, please call your pharmacy. Do not use My Chart to request refills or for acute issues that need immediate attention. If you send a my chart message, it may take a few days to be addressed, specially if I am not in the office.  Please be sure medication list is accurate. If a new problem present, please set up appointment sooner than planned today.

## 2022-07-21 NOTE — Assessment & Plan Note (Signed)
She agrees with Gabapentin 100 mg at bedtime. Some side effects discussed. Instead Ibuprofen she can try Meloxicam 7.5 mg daily prn. Side effects discussed. If not improved, we will consider lumbar MRI.

## 2022-07-29 ENCOUNTER — Telehealth: Payer: Self-pay | Admitting: Neurology

## 2022-07-29 NOTE — Telephone Encounter (Signed)
Patient came by the office wanting to schedule her SS.  Since her old [place] expired I started a new case.  It is pending.  [facility] is scheduled for 08/16/22 at 8 pm.

## 2022-08-03 ENCOUNTER — Telehealth: Payer: Self-pay | Admitting: Family Medicine

## 2022-08-03 NOTE — Telephone Encounter (Signed)
Patient would like to let Dr. Martin[place] know that the Gabapentin has helped her so she can continue taking it.

## 2022-08-04 NOTE — Telephone Encounter (Signed)
When she came by the office she was giving a paper of information about her SS appointment.  Updated [employer] insurance auth: [employer]- [employer] Josem Kaufmann: [id number] (exp. 07/29/22 to 10/27/22)  Patient is scheduled at [facility] for 08/16/22 at 8 pm.

## 2022-08-06 ENCOUNTER — Other Ambulatory Visit: Payer: Self-pay | Admitting: Family Medicine

## 2022-08-06 DIAGNOSIS — G8929 Other chronic pain: Secondary | ICD-10-CM

## 2022-08-06 MED ORDER — GABAPENTIN 100 MG PO CAPS: 100.0000 mg | ORAL_CAPSULE | Freq: Every day | ORAL | 2 refills | Status: DC

## 2022-08-06 NOTE — Telephone Encounter (Signed)
Yes, she can continue 1 cap at bedtime daily. Refills sent. Thanks, BJ

## 2022-08-16 ENCOUNTER — Ambulatory Visit (INDEPENDENT_AMBULATORY_CARE_PROVIDER_SITE_OTHER): Payer: Medicare HMO | Admitting: Neurology

## 2022-08-16 DIAGNOSIS — R0683 Snoring: Secondary | ICD-10-CM

## 2022-08-16 DIAGNOSIS — Z9189 Other specified personal risk factors, not elsewhere classified: Secondary | ICD-10-CM

## 2022-08-16 DIAGNOSIS — G4761 Periodic limb movement disorder: Secondary | ICD-10-CM

## 2022-08-16 DIAGNOSIS — G4733 Obstructive sleep apnea (adult) (pediatric): Secondary | ICD-10-CM

## 2022-08-16 DIAGNOSIS — R0681 Apnea, not elsewhere classified: Secondary | ICD-10-CM

## 2022-08-16 DIAGNOSIS — G478 Other sleep disorders: Secondary | ICD-10-CM

## 2022-08-16 DIAGNOSIS — E663 Overweight: Secondary | ICD-10-CM

## 2022-08-16 DIAGNOSIS — G4720 Circadian rhythm sleep disorder, unspecified type: Secondary | ICD-10-CM

## 2022-08-16 DIAGNOSIS — R519 Headache, unspecified: Secondary | ICD-10-CM

## 2022-08-16 DIAGNOSIS — G4719 Other hypersomnia: Secondary | ICD-10-CM

## 2022-08-20 ENCOUNTER — Telehealth: Payer: Self-pay

## 2022-08-20 NOTE — Telephone Encounter (Addendum)
New start Autopap Received: Today Ninfa Giannelli, Verdene Lennert, CMA  [place], [place]; [place], Eleonore Chiquito, [place], Mardene Celeste New orders have been placed for the above pt, DOB: 2045/09/23 Thanks  New, Tana Felts, Verdene Lennert, CMA; Darlina Guys; [place], Eleonore Chiquito, [place]; Nash Shearer Received, Thank you!

## 2022-08-20 NOTE — Telephone Encounter (Signed)
I called pt with language line interpreter. I advised pt that Dr. Rexene Alberts reviewed their sleep study results and found that pt has OSA. Dr. Rexene Alberts recommends that pt start  autoPAP. I reviewed PAP compliance expectations with the pt. Pt is agreeable to starting a CPAP. I advised pt that an order will be sent to a DME, [facility] and [facility] will call the pt within about one week after they file with the pt's insurance. [facility] will show the pt how to use the machine, fit for masks, and troubleshoot the CPAP if needed. A follow up appt was made for insurance purposes with Debbora Presto NP on 11/02/22. Pt verbalized understanding to arrive 15 minutes early and bring their CPAP. Pt verbalized understanding of results. Pt had no questions at this time but was encouraged to call back if questions arise. I have sent the order to [facility] and have received confirmation that they have received the order.

## 2022-08-20 NOTE — Procedures (Signed)
Physician Interpretation:     [facility] POLYSOMNOGRAPHY  INTERPRETATION REPORT   STUDY DATE:  08/16/2022     PATIENT NAME:  Jodi [name]         DATE OF BIRTH:  09/20/1945  PATIENT ID:  [id number]    TYPE OF STUDY:  PSG  READING PHYSICIAN: Star Age, MD, PhD   SCORING TECHNICIAN: Richard Miu, RPSGT  Referred by: Martinique, Betty G, MD   History and Indication for Testing: 77 year old right-handed woman with an underlying medical history of diabetes, lumbar degenerative disc disease, hypertension, anemia, history of polio, and overweight state, who reports snoring and excessive daytime somnolence as well as witnessed apneas by family. Her Epworth sleepiness score is 6 out of 24, fatigue severity score is 33 out of 63. Height: 60 in Weight: 142 lb (BMI 27) Neck Size: 15 in   MEDICATIONS: Fosamax, Norvasc, Lipitor, Calcium, Voltaren, Dramamine, Ferrous Sulfate, Flonase, Gemtesa, Hydrodiuril, Fusion Plus, Claritin, Antivert, Glucophage, Robaxin, Prilosec, Senokot, Kenalog, Vibegron, Vitamin D  TECHNICAL DESCRIPTION: The patient took gabapentin prior to start of the study. A registered sleep technologist was in attendance for the duration of the recording.  Data collection, scoring, video monitoring, and reporting were performed in compliance with the AASM Manual for the Scoring of Sleep and Associated Events; (Hypopnea is scored based on the criteria listed in Section VIII D. 1b in the AASM Manual V2.6 using a 4% oxygen desaturation rule or Hypopnea is scored based on the criteria listed in Section VIII D. 1a in the AASM Manual V2.6 using 3% oxygen desaturation and /or arousal rule).   SLEEP CONTINUITY AND SLEEP ARCHITECTURE:  Lights-out was at 20:33: and lights-on at  04:59:, with a total recording time of 8 hours, 26.5 min. Total sleep time ( TST) was 191.5 minutes with a markedly decreased sleep efficiency at 37.8%.  BODY POSITION:  TST was divided   between the following sleep positions: 62.9% supine;  37.1% lateral;  0% prone. Duration of total sleep and percent of total sleep in their respective position is as follows: supine 120 minutes (63%), non-supine 71 minutes (37%); right 00 minutes (0%), left 71 minutes (37%), and prone 00 minutes (0%).  Total supine REM sleep time was 00 minutes (0% of total REM sleep).  Sleep latency was increased at 53.5 minutes.  REM sleep latency was markedly delayed at 376.5 minutes. Of the total sleep time, the percentage of stage N1 sleep was 7.8%, stage N2 sleep was 70%, which is increased, stage N3 sleep was 8.1%, and REM sleep was 13.6%, which is decreased. Wake after sleep onset (WASO) time accounted for 261.5 minutes, with otherwise mild sleep fragmentation noted.  RESPIRATORY MONITORING:  Based on CMS criteria (using a 4% oxygen desaturation rule for scoring hypopneas), there were 0 apneas (0 obstructive; 0 central; 0 mixed), and 31 hypopneas.  Apnea index was 0.0. Hypopnea index was 9.7. The apnea-hypopnea index was 9.7/hour overall (2.0 supine, 48 non-supine; 48.5 REM, 0.0 supine REM).  There were 0 respiratory effort-related arousals (RERAs).  The RERA index was 0 events/h. Total respiratory disturbance index (RDI) was 9.7 events/h. RDI results showed: supine RDI  2.0 /h; non-supine RDI 22.8 /h; REM RDI 48.5 /h, supine REM RDI 0.0 /h.   Based on AASM criteria (using a 3% oxygen desaturation and /or arousal rule for scoring hypopneas), there were 0 apneas (0 obstructive; 0 central; 0 mixed), and 32 hypopneas. Apnea index was 0.0. Hypopnea index was 10.0. The apnea-hypopnea  index was 10.0 overall (2.0 supine, 48 non-supine; 48.5 REM, 0.0 supine REM).  There were 0 respiratory effort-related arousals (RERAs).  The RERA index was 0 events/h. Total respiratory disturbance index (RDI) was 10.0 events/h. RDI results showed: supine RDI  2.0 /h; non-supine RDI 23.7 /h; REM RDI 48.5 /h, supine REM RDI 0.0 /h.    OXIMETRY: Oxyhemoglobin Saturation Nadir during sleep was at  75%) from a mean of 91%.  Of the Total sleep time (TST)   hypoxemia (=<88%) was present for  14.5 minutes, or 7.6% of total sleep time.   LIMB MOVEMENTS: There were 67 periodic limb movements of sleep (21.0/hr), of which 0 (0.0/hr) were associated with an arousal.  AROUSAL: There were 26 arousals in total, for an arousal index of 8 arousals/hour.  Of these, 6 were identified as respiratory-related arousals (2 /h), 0 were PLM-related arousals (0 /h), and 32 were non-specific arousals (10 /h).  EEG:  Review of the EEG showed no abnormal electrical discharges and symmetrical bihemispheric findings.    EKG: The EKG revealed normal sinus rhythm (NSR). The average heart rate during sleep was 63 bpm.   AUDIO/VIDEO REVIEW: The audio and video review did not show any abnormal or unusual behaviors, movements, phonations or vocalizations. The patient took no restroom breaks. Snoring was noted in the mild to moderate range.  POST-STUDY QUESTIONNAIRE: Not completed.   IMPRESSION:  1. Obstructive Sleep Apnea (OSA) 2. PLMD (periodic limp movements of sleep) 3. Dysfunctions associated with sleep stages or arousal from sleep 4. Poor sleep pattern  RECOMMENDATIONS:  1. This study is limited due to a long period of wakefulness. The study demonstrates overall mild obstructive sleep apnea, more pronounced during REM sleep with a total AHI of 9.7/hour, O2 nadir of 75% (during non-supine REM sleep). Given the patient's medical history and sleep related complaints, treatment with positive airway pressure is recommended; this can be achieved in the form of autoPAP therapy at home. Alternatively, a full-night CPAP titration study would allow optimization of therapy if needed. Other treatment options may include avoidance of supine sleep position along with weight loss (where clinically appropriate), or the use of an oral appliance (in selected patients).  Please note, that untreated obstructive sleep apnea may carry additional perioperative morbidity. Patients with significant obstructive sleep apnea should receive perioperative PAP therapy and the surgeons and particularly the anesthesiologist should be informed of the diagnosis and the severity of the sleep disordered breathing. 2. Mild PLMs (periodic limb movements of sleep) were noted during the last part of the study without any significant arousals; clinical correlation is recommended. PLMs may improve with OSA treatment. 3. This study shows poor sleep consolidation and abnormal sleep stage percentages; these are nonspecific findings and per se do not signify an intrinsic sleep disorder or a cause for the patient's sleep-related symptoms. Causes include (but are not limited to) the first night effect of the sleep study, circadian rhythm disturbances, medication effect or an underlying mood disorder or medical problem.  4. The patient should be cautioned not to drive, work at heights, or operate dangerous or heavy equipment when tired or sleepy. Review and reiteration of good sleep hygiene measures should be pursued with any patient. 5. The patient will be seen in follow-up by Dr. Rexene Alberts at [facility] for discussion of the test results and further management strategies. The referring provider will be notified of the test results.   I certify that I have reviewed the entire raw data recording prior to the  issuance of this report in accordance with the Standards of Accreditation of the American Academy of Sleep Medicine (AASM).  Star Age, MD, PhD Medical Director, [facility]) [place], ABPN (Neurology and Sleep)              Technical Report:   General Information  Name: Jodi [name], Jodi [name] BMI: 27.73 Physician: Star Age, MD  ID: [id number] Height: 60.0 in Technician: Richard Miu, RPSGT  Sex: Female Weight: 142.0 lb Record: [id number]  Age:  66 [1945-10-04] Date: 08/16/2022    Medical & Medication History    77 year old right-handed woman with an underlying medical history of diabetes, lumbar degenerative disc disease, hypertension, anemia, history of polio, and overweight state, who reports snoring and excessive daytime somnolence as well as witnessed apneas by family.  Fosamax, Norvasc, Lipitor, Calcium, Voltaren, Dramamine, Ferrous Sulfate, Flonase, Gemtesa, Hydrodiuril, Fusion Plus, Claritin, Antivert, Glucophage, Robaxin, Prilosec, Senokot, Kenalog, Vibegron, Vitamin D   Sleep Disorder      Comments   The patient came into the sleep lab for a PSG. No interpreter showed up for appt. Bio-Cals were skipped due to the patient speaking very little [place]. She took gabapentin prior to start of study. No restroom breaks. EKG kept in NSR. Moderate snoring noted. All respiratory events scored with a 4% desat. The patient slept supine and lateral. The patient had very little sleep. She had a long period of WASO. Oral breathing observed. Respiratory events mostly in REM. PLM's noted later in the study.     Lights out: 08:33:29 PM Lights on: 04:59:58 AM   Time Total Supine Side Prone Upright  Recording (TRT) 8h 26.39m6h 54.5752mh 32.52m652m 0.52m 64m0.52m  19mep (TST) 3h 11.12m 2h6m12m 1h 52m52m 0h 0752m 0h 0.19m  Late72m N1 N2 N3 REM Onset Per. Slp. Eff.  Actual 0h 0.52m 0h 2.52m68m 10.12m712m 16.12m 67m53.12m 1212m.52m 37.63m   Stg14mr Wake N1 N2 N3 REM  Total 315.0 15.0 135.0 15.5 26.0  Supine 294.0 12.0 93.0 15.5 0.0  Side 21.0 3.0 42.0 0.0 26.0  Prone 0.0 0.0 0.0 0.0 0.0  Upright 0.0 0.0 0.0 0.0 0.0   Stg % Wake N1 N2 N3 REM  Total 62.2 7.8 70.5 8.1 13.6  Supine 58.0 6.3 48.6 8.1 0.0  Side 4.1 1.6 21.9 0.0 13.6  Prone 0.0 0.0 0.0 0.0 0.0  Upright 0.0 0.0 0.0 0.0 0.0     Apnea Summary Sub Supine Side Prone Upright  Total 0 Total 0 0 0 0 0    REM 0 0 0 0 0    NREM 0 0 0 0 0  Obs 0 REM 0 0 0 0 0    NREM 0 0 0 0 0  Mix 0 REM 0 0 0 0 0     NREM 0 0 0 0 0  Cen 0 REM 0 0 0 0 0    NREM 0 0 0 0 0   Rera Summary Sub Supine Side Prone Upright  Total 0 Total 0 0 0 0 0    REM 0 0 0 0 0    NREM 0 0 0 0 0   Hypopnea Summary Sub Supine Side Prone Upright  Total 32 Total 32 4 28 0 0    REM 21 0 21 0 0    NREM '11 4 7 '$ 0 0   4% Hypopnea Summary Sub Supine Side Prone Upright  Total (4%) 31 Total  $'31 4 27 'V$ 0 0    REM 21 0 21 0 0    NREM '10 4 6 '$ 0 0     AHI Total Obs Mix Cen  10.03 Apnea 0.00 0.00 0.00 0.00   Hypopnea 10.03 -- -- --  9.71 Hypopnea (4%) 9.71 -- -- --    Total Supine Side Prone Upright  Position AHI 10.03 1.99 23.66 0.00 0.00  REM AHI 48.46   NREM AHI 3.99   Position RDI 10.03 1.99 23.66 0.00 0.00  REM RDI 48.46   NREM RDI 3.99    4% Hypopnea Total Supine Side Prone Upright  Position AHI (4%) 9.71 1.99 22.82 0.00 0.00  REM AHI (4%) 48.46   NREM AHI (4%) 3.63   Position RDI (4%) 9.71 1.99 22.82 0.00 0.00  REM RDI (4%) 48.46   NREM RDI (4%) 3.63    Desaturation Information Threshold: 2% <100% <90% <80% <70% <60% <50% <40%  Supine 169.0 9.0 0.0 0.0 0.0 0.0 0.0  Side 45.0 20.0 1.0 0.0 0.0 0.0 0.0  Prone 0.0 0.0 0.0 0.0 0.0 0.0 0.0  Upright 0.0 0.0 0.0 0.0 0.0 0.0 0.0  Total 214.0 29.0 1.0 0.0 0.0 0.0 0.0  Index 28.3 3.8 0.1 0.0 0.0 0.0 0.0   Threshold: 3% <100% <90% <80% <70% <60% <50% <40%  Supine 58.0 3.0 0.0 0.0 0.0 0.0 0.0  Side 31.0 20.0 1.0 0.0 0.0 0.0 0.0  Prone 0.0 0.0 0.0 0.0 0.0 0.0 0.0  Upright 0.0 0.0 0.0 0.0 0.0 0.0 0.0  Total 89.0 23.0 1.0 0.0 0.0 0.0 0.0  Index 11.8 3.0 0.1 0.0 0.0 0.0 0.0   Threshold: 4% <100% <90% <80% <70% <60% <50% <40%  Supine 26.0 3.0 0.0 0.0 0.0 0.0 0.0  Side 27.0 20.0 1.0 0.0 0.0 0.0 0.0  Prone 0.0 0.0 0.0 0.0 0.0 0.0 0.0  Upright 0.0 0.0 0.0 0.0 0.0 0.0 0.0  Total 53.0 23.0 1.0 0.0 0.0 0.0 0.0  Index 7.0 3.0 0.1 0.0 0.0 0.0 0.0   Threshold: 3% <100% <90% <80% <70% <60% <50% <40%  Supine 58 3 0 0 0 0 0  Side '31 20 1 '$ 0 0 0 0  Prone 0 0 0 0 0 0 0  Upright  0 0 0 0 0 0 0  Total 89 23 1 0 0 0 0   Awakening/Arousal Information # of Awakenings 17  Wake after sleep onset 261.61m Wake after persistent sleep 252.026m Arousal Assoc. Arousals Index  Apneas 0 0.0  Hypopneas 6 1.9  Leg Movements 0 0.0  Snore 0 0.0  PTT Arousals 0 0.0  Spontaneous 32 10.0  Total 38 11.9  Leg Movement Information PLMS LMs Index  Total LMs during PLMS 67 21.0  LMs w/ Microarousals 0 0.0   LM LMs Index  w/ Microarousal 0 0.0  w/ Awakening 0 0.0  w/ Resp Event 0 0.0  Spontaneous 2 0.6  Total 2 0.6     Desaturation threshold setting: 3% Minimum desaturation setting: 10 seconds SaO2 nadir: 75% The longest event was a 110 sec obstructive Hypopnea with a minimum SaO2 of 75%. The lowest SaO2 was 75% associated with a 110 sec obstructive Hypopnea. EKG Rates EKG Avg Max Min  Awake 66 94 56  Asleep 63 80 53  EKG Events: Tachycardia

## 2022-08-20 NOTE — Telephone Encounter (Signed)
-----  Message from Kary Kos, Conrad sent at 08/20/2022 10:04 AM EST -----  ----- Message ----- From: Star Age, MD Sent: 08/20/2022  10:02 AM EST To: [facility] 4 Results  Interpreter needed, unless able to talk with English speaking family member on DPR:  Patient referred by Dr. Martinique, seen by me on 03/11/22, diagnostic PSG on 08/16/22.    Please call and notify the patient that the recent sleep study showed evidence of sleep apnea (OSA). She did not sleep very well but may benefit from treatment, to see if she feels and sleeps better after treatment. To that end I recommend treatment for this in the form of autoPAP, which means, that we don't have to bring her back for a second sleep study with CPAP, but will let him try an autoPAP machine at home, through a DME company (of her choice, or as per insurance requirement). The DME representative will educate her on how to use the machine, how to put the mask on, etc. I have placed an order in the chart. Please send referral, talk to patient, send report to referring MD. We will need a FU in sleep clinic for 10 weeks post-PAP set up, please arrange that with me or one of our NPs. Thanks,   Star Age, MD, PhD [facility])

## 2022-08-20 NOTE — Addendum Note (Signed)
Addended by: Star Age on: 08/20/2022 10:03 AM   Modules accepted: Orders

## 2022-09-19 ENCOUNTER — Other Ambulatory Visit: Payer: Self-pay | Admitting: Family Medicine

## 2022-09-19 DIAGNOSIS — G8929 Other chronic pain: Secondary | ICD-10-CM

## 2022-09-28 ENCOUNTER — Other Ambulatory Visit: Payer: Self-pay | Admitting: Internal Medicine

## 2022-09-28 DIAGNOSIS — E1140 Type 2 diabetes mellitus with diabetic neuropathy, unspecified: Secondary | ICD-10-CM

## 2022-10-15 ENCOUNTER — Other Ambulatory Visit: Payer: Self-pay | Admitting: Family Medicine

## 2022-10-15 DIAGNOSIS — E1169 Type 2 diabetes mellitus with other specified complication: Secondary | ICD-10-CM

## 2022-10-15 DIAGNOSIS — K219 Gastro-esophageal reflux disease without esophagitis: Secondary | ICD-10-CM

## 2022-11-02 ENCOUNTER — Ambulatory Visit (INDEPENDENT_AMBULATORY_CARE_PROVIDER_SITE_OTHER): Payer: Medicare HMO | Admitting: Family Medicine

## 2022-11-02 ENCOUNTER — Encounter: Payer: Self-pay | Admitting: Family Medicine

## 2022-11-02 VITALS — BP 133/79 | HR 86 | Ht 60.0 in | Wt 146.0 lb

## 2022-11-02 DIAGNOSIS — G4733 Obstructive sleep apnea (adult) (pediatric): Secondary | ICD-10-CM

## 2022-11-02 NOTE — Progress Notes (Signed)
PATIENT: Jodi [name] DOB: 05-31-1946  REASON FOR VISIT: follow up HISTORY FROM: patient  Chief Complaint  Patient presents with   Room 1    Pt is here with [name]. Pt states that things are going very bad with her CPAP machine. Pt states that she can't sleep with it. Pt states that her CPAP is very uncomfortable.     HISTORY OF PRESENT ILLNESS:  11/02/22 ALL:  Jodi [name] is a 78 y.o. female here today for follow up for OSA on CPAP.  She was seen in consult with Jodi Jodi [name] 02/2022 for concerns of sleep apnea. PSG confirmed mild OSA with AHI 9.7/hr and O2 nadir of 75%. Study limited by prolonged period of wakefulness. AutoPAP advised. She has not tolerated therapy well at all. She feels that she is not able to breath. She used a FFM during testing but felt she was smothering. She was send Airfit N30 nasal pillow but reports having a difficult time breathing through her nose. She has only used therapy twice for about 45 minutes. She lives alone. She is willing to use therapy.     Jodi [name] assisted with interpreting for today's visit.   HISTORY: (copied from Jodi [name] previous note)  Dear Jodi. [place],   I saw your patient, Jodi [name], upon your kind request in my sleep clinic today for initial consultation of her sleep disorder, in particular, concern for underlying obstructive sleep apnea.  The patient is accompanied by Jodi [name], Spanish [name] today.  As you know, Jodi [name] is a 77 year old right-handed woman with an underlying medical history of diabetes, lumbar degenerative disc disease, hypertension, anemia, history of polio, and overweight state, who reports snoring and excessive daytime somnolence as well as witnessed apneas by family.  I reviewed your office note from 01/16/2022.  Her Epworth sleepiness score is 6 out of 24, fatigue severity score is 33 out of 63.  Her son has noted pauses in her breathing and family reports loud snoring when she is around  them.  She lives alone, she is divorced, she goes to bed between 10 and 10:30 PM but has trouble falling asleep, may be awake for a few hours.  Rise time is generally between 6 and 7 AM.  She does not have night to night nocturia but does occasionally wake up with a dull, achy headache.  She does not wake up rested.  She has no family history of sleep apnea as far she knows.  She has 2 sons, 1 son snores, the other does not.  She drinks caffeine in the form of coffee, 1 cup in the morning, she is a non-smoker and does not drink any alcohol.   REVIEW OF SYSTEMS: Out of a complete 14 system review of symptoms, the patient complains only of the following symptoms, claustrophobia and all other reviewed systems are negative.  ESS: not completed today   ALLERGIES: Allergies  Allergen Reactions   Pork-Derived Products     Rash, joint pain   Shellfish Allergy    Shellfish Allergy Rash and Other (See Comments)    joint pain, GI pain    HOME MEDICATIONS: Outpatient Medications Prior to Visit  Medication Sig Dispense Refill   ACCU-CHEK SOFTCLIX LANCETS lancets Use as instructed to test blood glucose twice daily. 100 each 12   alendronate (FOSAMAX) 70 MG tablet TAKE 1 TABLET (70 MG TOTAL) BY MOUTH ONCE A WEEK. TAKE 1 TABLET (70 MG TOTAL) BY MOUTH ONCE A WEEK. 13 tablet  3   atorvastatin (LIPITOR) 10 MG tablet TOME UNA TABLETA TODOS LOS DIAS 90 tablet 2   Blood Glucose Monitoring Suppl (ACCU-CHEK GUIDE ME) w/Device KIT Use as directed 1 kit 0   CALCIUM PO Take 1 tablet by mouth daily.     diclofenac Sodium (VOLTAREN) 1 % GEL APPLY 4 G TOPICALLY 4 (FOUR) TIMES DAILY. LEFT KNEE. 100 g 0   dimenhyDRINATE (DRAMAMINE) 50 MG tablet Take 1 tablet (50 mg total) by mouth every 8 (eight) hours as needed. 30 tablet 0   ferrous sulfate 325 (65 FE) MG tablet Take 1 tablet (325 mg total) by mouth daily with breakfast. 90 tablet 1   fluticasone (FLONASE) 50 MCG/ACT nasal spray PLACE 1 SPRAY INTO BOTH NOSTRILS DAILY  AS NEEDED FOR ALLERGIES OR RHINITIS. 48 mL 1   gabapentin (NEURONTIN) 100 MG capsule Take 1 capsule (100 mg total) by mouth at bedtime. 30 capsule 2   GEMTESA 75 MG TABS TOME UNA TABLETA TODOS LOS DIAS 30 tablet 11   glucose blood (ACCU-CHEK GUIDE) test strip Use as instructed 100 each 12   hydrochlorothiazide (HYDRODIURIL) 12.5 MG tablet TOME UNA TABLETA TODOS LOS DIAS 90 tablet 1   Iron-FA-B Cmp-C-Biot-Probiotic (FUSION PLUS) CAPS Take 1 tablet by mouth every other day. As tolerated 90 capsule 0   Lancets Misc. (ACCU-CHEK FASTCLIX LANCET) KIT Use as directed 1 kit 6   meclizine (ANTIVERT) 12.5 MG tablet Take 1 tablet (12.5 mg total) by mouth daily as needed for dizziness. 30 tablet 0   meloxicam (MOBIC) 7.5 MG tablet TOME UNA TABLETA POR VIA ORAL A DIARIO CUANDO SEA NECESARIO PARA EL DOLOR 30 tablet 1   omeprazole (PRILOSEC) 40 MG capsule TOME UNA CAPSULA TODOS LOS DIAS 90 capsule 2   triamcinolone cream (KENALOG) 0.1 % APLIQUE AL AREA AFECTADA DOS VECES AL DIA 30 g 1   Vibegron 75 MG TABS Take 1 tablet by mouth daily. 30 tablet 5   Vitamin D, Cholecalciferol, 10 MCG (400 UNIT) CHEW Chew 2 tablets (800 Units total) by mouth daily. 60 tablet 6   amLODipine (NORVASC) 5 MG tablet Take 0.5 tablets (2.5 mg total) by mouth daily. (Patient not taking: Reported on 11/02/2022) 45 tablet 3   Elastic Bandages & Supports (LUMBAR BACK BRACE/SUPPORT PAD) MISC 1 application by Does not apply route daily. (Patient not taking: Reported on 11/02/2022) 1 each 0   loratadine (CLARITIN) 10 MG tablet TOME UNA TABLETA TODOS LOS DIAS (Patient not taking: Reported on 11/02/2022) 90 tablet 1   senna-docusate (SENOKOT-S) 8.6-50 MG tablet Take 1 tablet by mouth at bedtime as needed for mild constipation. (Patient not taking: Reported on 11/02/2022) 120 tablet 3   No facility-administered medications prior to visit.    PAST MEDICAL HISTORY: Past Medical History:  Diagnosis Date   Anemia    Diabetes mellitus without  complication    Hypertension    Insomnia    Polio    in childhood    PAST SURGICAL HISTORY: Past Surgical History:  Procedure Laterality Date   CESAREAN SECTION     SHOULDER SURGERY Left 2010    FAMILY HISTORY: Family History  Family history unknown: Yes    SOCIAL HISTORY: Social History   Socioeconomic History   Marital status: Unknown    Spouse name: Not on file   Number of children: Not on file   Years of education: Not on file   Highest education level: Not on file  Occupational History   Not on file  Tobacco Use   Smoking status: Never   Smokeless tobacco: Never  Vaping Use   Vaping Use: Never used  Substance and Sexual Activity   Alcohol use: Not Currently   Drug use: Never   Sexual activity: Not Currently  Other Topics Concern   Not on file  Social History Narrative   ** Merged History Encounter **    Caffiene; 1-cup coffee daily   Working: no retired   Programme researcher, broadcasting/film/video; Human resources officer.    Social Determinants of Health   Financial Resource Strain: Low Risk  (04/16/2022)   Overall Financial Resource Strain (CARDIA)    Difficulty of Paying Living Expenses: Not hard at all  Food Insecurity: No Food Insecurity (04/16/2022)   Hunger Vital Sign    Worried About Running Out of Food in the Last Year: Never true    Ran Out of Food in the Last Year: Never true  Transportation Needs: No Transportation Needs (04/16/2022)   PRAPARE - Administrator, Civil Service (Medical): No    Lack of Transportation (Non-Medical): No  Physical Activity: Sufficiently Active (04/16/2022)   Exercise Vital Sign    Days of Exercise per Week: 5 days    Minutes of Exercise per Session: 60 min  Stress: No Stress Concern Present (04/16/2022)   [employer] of Occupational Health - Occupational Stress Questionnaire    Feeling of Stress : Not at all  Social Connections: Moderately Integrated (04/16/2022)   Social Connection and Isolation Panel [NHANES]    Frequency of  Communication with Friends and Family: More than three times a week    Frequency of Social Gatherings with Friends and Family: More than three times a week    Attends Religious Services: More than 4 times per year    Active Member of [employer] or Organizations: Yes    Attends Engineer, structural: More than 4 times per year    Marital Status: Divorced  Intimate Partner Violence: Not At Risk (04/16/2022)   Humiliation, Afraid, Rape, and Kick questionnaire    Fear of Current or Ex-Partner: No    Emotionally Abused: No    Physically Abused: No    Sexually Abused: No     PHYSICAL EXAM  Vitals:   11/02/22 1531  BP: 133/79  Pulse: 86  Weight: 146 lb (66.2 kg)  Height: 5' (1.524 m)   Body mass index is 28.51 kg/m.  Generalized: Well developed, in no acute distress  Cardiology: normal rate and rhythm, no murmur noted Respiratory: clear to auscultation bilaterally  Neurological examination  Mentation: Alert oriented to time, place, history taking. Follows all commands speech and language fluent Cranial nerve II-XII: Pupils were equal round reactive to light. Extraocular movements were full, visual field were full  Motor: The motor testing reveals 5 over 5 strength of all 4 extremities. Good symmetric motor tone is noted throughout.  Gait and station: Gait is normal.    DIAGNOSTIC DATA (LABS, IMAGING, TESTING) - I reviewed patient records, labs, notes, testing and imaging myself where available.      No data to display           Lab Results  Component Value Date   WBC 5.5 07/21/2022   HGB 12.6 07/21/2022   HCT 38.0 07/21/2022   MCV 91.0 07/21/2022   PLT 385.0 07/21/2022      Component Value Date/Time   NA 140 07/21/2022 0947   NA 141 01/07/2021 1554   K 3.6 07/21/2022 0947   CL 103  07/21/2022 0947   CO2 29 07/21/2022 0947   GLUCOSE 96 07/21/2022 0947   BUN 22 07/21/2022 0947   BUN 16 01/07/2021 1554   CREATININE 0.57 07/21/2022 0947   CREATININE 0.72  03/25/2018 0912   CREATININE 0.66 10/05/2016 1536   CALCIUM 10.1 07/21/2022 0947   PROT 7.6 07/21/2022 0947   PROT 7.4 01/07/2021 1554   ALBUMIN 4.2 07/21/2022 0947   ALBUMIN 4.5 01/07/2021 1554   AST 23 07/21/2022 0947   AST 19 03/25/2018 0912   ALT 19 07/21/2022 0947   ALT 13 03/25/2018 0912   ALKPHOS 84 07/21/2022 0947   BILITOT 0.7 07/21/2022 0947   BILITOT 0.3 01/07/2021 1554   BILITOT 0.6 03/25/2018 0912   GFRNONAA 88 05/12/2019 0907   GFRNONAA >60 03/25/2018 0912   GFRNONAA 89 10/05/2016 1536   GFRAA 101 05/12/2019 0907   GFRAA >60 03/25/2018 0912   GFRAA >89 10/05/2016 1536   Lab Results  Component Value Date   CHOL 184 07/21/2022   HDL 72.60 07/21/2022   LDLCALC 91 07/21/2022   TRIG 100.0 07/21/2022   CHOLHDL 3 07/21/2022   Lab Results  Component Value Date   HGBA1C 6.1 07/21/2022   Lab Results  Component Value Date   VITAMINB12 998 (H) 02/13/2021   Lab Results  Component Value Date   TSH 1.27 02/13/2021     ASSESSMENT AND PLAN 77 y.o. year old female  has a past medical history of Anemia, Diabetes mellitus without complication, Hypertension, Insomnia, and Polio. here with     ICD-10-CM   1. OSA on CPAP  G47.33 For home use only DME continuous positive airway pressure (CPAP)        Jodi [name] has had a hard time tolerating CPAP therapy. Compliance report reveals very little usage. We have reviewed her sleep study and discussed concerns of hypoxia. I have offered to send her for a mask refitting and she agrees to continue working toward regular use of therapy.  She was encouraged to work toward goal of using CPAP nightly and for greater than 4 hours each night. Risks of untreated sleep apnea review and education materials provided. Healthy lifestyle habits encouraged. She will follow up in 3-4 months, sooner if needed. She verbalizes understanding and agreement with this plan.    Orders Placed This Encounter  Procedures   For home use only  DME continuous positive airway pressure (CPAP)    Mask refitting, spanish speaking patient with claustrophobia. Feels she can not breath with Airfit N30.    Order Specific Question:   Length of Need    Answer:   Lifetime    Order Specific Question:   Patient has OSA or probable OSA    Answer:   Yes    Order Specific Question:   Is the patient currently using CPAP in the home    Answer:   Yes    Order Specific Question:   Settings    Answer:   Other see comments    Order Specific Question:   CPAP supplies needed    Answer:   Mask, headgear, cushions, filters, heated tubing and water chamber     No orders of the defined types were placed in this encounter.     Shawnie Dapper, FNP-C 11/02/2022, 4:07 PM [facility] [address] [place], [place] [address] [phone]

## 2022-11-02 NOTE — Patient Instructions (Signed)
Please continue using your CPAP regularly. While your insurance requires that you use CPAP at least 4 hours each night on 70% of the nights, I recommend, that you not skip any nights and use it throughout the night if you can. Getting used to CPAP and staying with the treatment long term does take time and patience and discipline. Untreated obstructive sleep apnea when it is moderate to severe can have an adverse impact on cardiovascular health and raise her risk for heart disease, arrhythmias, hypertension, congestive heart failure, stroke and diabetes. Untreated obstructive sleep apnea causes sleep disruption, nonrestorative sleep, and sleep deprivation. This can have an impact on your day to day functioning and cause daytime sleepiness and impairment of cognitive function, memory loss, mood disturbance, and problems focussing. Using CPAP regularly can improve these symptoms.  We will send orders for a mask refitting. Please try to use CPAP every night. Start with small goals and build on these each week.   Follow up with me in 3-4 months

## 2022-11-20 ENCOUNTER — Ambulatory Visit: Payer: Medicare HMO | Admitting: Family Medicine

## 2022-11-23 NOTE — Progress Notes (Unsigned)
HPI: [name] is a 77 y.o. female, who is here today for chronic disease management.  Last seen on 07/21/22. Jodi [name] presents today for follow up on her generalized body pain. She is requesting a refill on Voltaren gel. For lower back pain and arthralgias she was prescribed Meloxicam 7.5 mg, she is asking if dose can be increased.  C/O fatigue. OSA: She has not been using her CPAP machine, she struggles to adapt to it. A change in mask type has been discussed with sleep specialist to improve comfort and treatment efficacy.  HLD: She is not on Atorvastatin 10 mg. She does not feel like she has cholesterol issues. Lab Results  Component Value Date   CHOL 184 07/21/2022   HDL 72.60 07/21/2022   LDLCALC 91 07/21/2022   TRIG 100.0 07/21/2022   CHOLHDL 3 07/21/2022   -Lower back pain with radiation to LLE. She is on Gabapentin 100 mg at bedtime, which has not fully resolved her symptoms but has helped greatly.  Lower back pain and LE arthralgias are exacerbated by walking, she believes it is related to osteoporosis, hx of OA. She has been on Fosamax 70 mg weekly, she believes she has taken it for 5 years.  Reports hx of polio during childhood, no significant neurologic deficit except for LLE muscle atrophy and length discrepancy when compared with RLE.  -She reports a recent issue with her lower eye lid left eye, described as slightly swollen and puffy, starting about 15 days ago, improved.   Diabetes Mellitus II: Dx'ed in 2015. She is on non pharmacologic treatment. She was on Metformin, discontinued in 07/2022. BS's 90's. Negative for symptoms of hypoglycemia, polyuria, polydipsia, numbness extremities, foot ulcers/trauma  Lab Results  Component Value Date   HGBA1C 6.1 07/21/2022   Lab Results  Component Value Date   MICROALBUR <0.7 07/21/2022   Hypertension on amlodipine 5 mg 1/2 tab daily. Negative for unusual or severe headache, visual changes, exertional chest  pain, dyspnea,  focal weakness, or edema.  Lab Results  Component Value Date   CREATININE 0.57 07/21/2022   BUN 22 07/21/2022   NA 140 07/21/2022   K 3.6 07/21/2022   CL 103 07/21/2022   CO2 29 07/21/2022   Review of Systems  Constitutional:  Positive for fatigue. Negative for appetite change and fever.  HENT:  Negative for mouth sores and sore throat.   Respiratory:  Negative for cough and wheezing.   Gastrointestinal:  Negative for abdominal pain, nausea and vomiting.  Endocrine: Negative for cold intolerance and heat intolerance.  Genitourinary:  Negative for decreased urine volume, dysuria and hematuria.  Musculoskeletal:  Positive for arthralgias, back pain and myalgias.  Skin:  Negative for rash.  Neurological:  Negative for syncope and facial asymmetry.  Psychiatric/Behavioral:  Negative for confusion. The patient is nervous/anxious.   See other pertinent positives and negatives in HPI.  Current Outpatient Medications on File Prior to Visit  Medication Sig Dispense Refill   ACCU-CHEK SOFTCLIX LANCETS lancets Use as instructed to test blood glucose twice daily. 100 each 12   alendronate (FOSAMAX) 70 MG tablet TAKE 1 TABLET (70 MG TOTAL) BY MOUTH ONCE A WEEK. TAKE 1 TABLET (70 MG TOTAL) BY MOUTH ONCE A WEEK. 13 tablet 3   amLODipine (NORVASC) 5 MG tablet Take 0.5 tablets (2.5 mg total) by mouth daily. 45 tablet 3   atorvastatin (LIPITOR) 10 MG tablet TOME UNA TABLETA TODOS LOS DIAS 90 tablet 2   Blood Glucose Monitoring  Suppl (ACCU-CHEK GUIDE ME) w/Device KIT Use as directed 1 kit 0   CALCIUM PO Take 1 tablet by mouth daily.     diclofenac Sodium (VOLTAREN) 1 % GEL APPLY 4 G TOPICALLY 4 (FOUR) TIMES DAILY. LEFT KNEE. 100 g 0   ferrous sulfate 325 (65 FE) MG tablet Take 1 tablet (325 mg total) by mouth daily with breakfast. 90 tablet 1   fluticasone (FLONASE) 50 MCG/ACT nasal spray PLACE 1 SPRAY INTO BOTH NOSTRILS DAILY AS NEEDED FOR ALLERGIES OR RHINITIS. 48 mL 1   gabapentin  (NEURONTIN) 100 MG capsule Take 1 capsule (100 mg total) by mouth at bedtime. 30 capsule 2   GEMTESA 75 MG TABS TOME UNA TABLETA TODOS LOS DIAS 30 tablet 11   glucose blood (ACCU-CHEK GUIDE) test strip Use as instructed 100 each 12   hydrochlorothiazide (HYDRODIURIL) 12.5 MG tablet TOME UNA TABLETA TODOS LOS DIAS 90 tablet 1   Iron-FA-B Cmp-C-Biot-Probiotic (FUSION PLUS) CAPS Take 1 tablet by mouth every other day. As tolerated 90 capsule 0   Lancets Misc. (ACCU-CHEK FASTCLIX LANCET) KIT Use as directed 1 kit 6   loratadine (CLARITIN) 10 MG tablet TOME UNA TABLETA TODOS LOS DIAS 90 tablet 1   meclizine (ANTIVERT) 12.5 MG tablet Take 1 tablet (12.5 mg total) by mouth daily as needed for dizziness. 30 tablet 0   meloxicam (MOBIC) 7.5 MG tablet TOME UNA TABLETA POR VIA ORAL A DIARIO CUANDO SEA NECESARIO PARA EL DOLOR 30 tablet 1   omeprazole (PRILOSEC) 40 MG capsule TOME UNA CAPSULA TODOS LOS DIAS 90 capsule 2   senna-docusate (SENOKOT-S) 8.6-50 MG tablet Take 1 tablet by mouth at bedtime as needed for mild constipation. 120 tablet 3   triamcinolone cream (KENALOG) 0.1 % APLIQUE AL AREA AFECTADA DOS VECES AL DIA 30 g 1   Vibegron 75 MG TABS Take 1 tablet by mouth daily. 30 tablet 5   Vitamin D, Cholecalciferol, 10 MCG (400 UNIT) CHEW Chew 2 tablets (800 Units total) by mouth daily. 60 tablet 6   No current facility-administered medications on file prior to visit.    Past Medical History:  Diagnosis Date   Anemia    Diabetes mellitus without complication (HCC)    Hypertension    Insomnia    Polio    in childhood   Allergies  Allergen Reactions   Pork-Derived Products     Rash, joint pain   Shellfish Allergy    Shellfish Allergy Rash and Other (See Comments)    joint pain, GI pain    Social History   Socioeconomic History   Marital status: Unknown    Spouse name: Not on file   Number of children: Not on file   Years of education: Not on file   Highest education level: Not on  file  Occupational History   Not on file  Tobacco Use   Smoking status: Never   Smokeless tobacco: Never  Vaping Use   Vaping Use: Never used  Substance and Sexual Activity   Alcohol use: Not Currently   Drug use: Never   Sexual activity: Not Currently  Other Topics Concern   Not on file  Social History Narrative   ** Merged History Encounter **    Caffiene; 1-cup coffee daily   Working: no retired   Programme researcher, broadcasting/film/video; Human resources officer.    Social Determinants of Health   Financial Resource Strain: Low Risk  (04/16/2022)   Overall Financial Resource Strain (CARDIA)    Difficulty of Paying Living  Expenses: Not hard at all  Food Insecurity: No Food Insecurity (04/16/2022)   Hunger Vital Sign    Worried About Running Out of Food in the Last Year: Never true    Ran Out of Food in the Last Year: Never true  Transportation Needs: No Transportation Needs (04/16/2022)   PRAPARE - Administrator, Civil Service (Medical): No    Lack of Transportation (Non-Medical): No  Physical Activity: Sufficiently Active (04/16/2022)   Exercise Vital Sign    Days of Exercise per Week: 5 days    Minutes of Exercise per Session: 60 min  Stress: No Stress Concern Present (04/16/2022)   [employer] of Occupational Health - Occupational Stress Questionnaire    Feeling of Stress : Not at all  Social Connections: Moderately Integrated (04/16/2022)   Social Connection and Isolation Panel [NHANES]    Frequency of Communication with Friends and Family: More than three times a week    Frequency of Social Gatherings with Friends and Family: More than three times a week    Attends Religious Services: More than 4 times per year    Active Member of [employer] or Organizations: Yes    Attends Banker Meetings: More than 4 times per year    Marital Status: Divorced    Vitals:   11/24/22 1039  BP: 120/70  Pulse: 87  Resp: 16  Temp: 98.2 F (36.8 C)  SpO2: 97%   Body mass index is 28.51  kg/m.  Physical Exam Vitals and nursing note reviewed.  Constitutional:      General: She is not in acute distress.    Appearance: She is well-developed.  HENT:     Head: Normocephalic and atraumatic.     Mouth/Throat:     Mouth: Mucous membranes are moist.     Pharynx: Oropharynx is clear.  Eyes:     Conjunctiva/sclera: Conjunctivae normal.     Pupils: Pupils are equal, round, and reactive to light.     Comments: Left lower eye lid with localized minimal edema and erythema on the edge. No masses, minimal tenderness.  Cardiovascular:     Rate and Rhythm: Normal rate and regular rhythm.     Pulses:          Posterior tibial pulses are 2+ on the right side and 2+ on the left side.     Heart sounds: No murmur heard. Pulmonary:     Effort: Pulmonary effort is normal. No respiratory distress.     Breath sounds: Normal breath sounds.  Abdominal:     Palpations: Abdomen is soft. There is no hepatomegaly or mass.     Tenderness: There is no abdominal tenderness.  Musculoskeletal:     Right shoulder: Tenderness present.     Left shoulder: Tenderness present.     Right elbow: Tenderness present.     Left elbow: Tenderness present.     Lumbar back: Tenderness present. Negative right straight leg raise test and negative left straight leg raise test.       Back:     Comments: Bilateral hands with Heberden's node and Bouchard's nodes. No signs of synovitis. Mild limitation of wrist flexion. Pain with movement of elbows and shoulders. No significant limitation of ROM.   Lymphadenopathy:     Cervical: No cervical adenopathy.  Skin:    General: Skin is warm.     Findings: No erythema or rash.  Neurological:     General: No focal deficit present.  Mental Status: She is alert and oriented to person, place, and time.     Cranial Nerves: No cranial nerve deficit.     Gait: Gait normal.  Psychiatric:        Mood and Affect: Affect normal. Mood is anxious.   ASSESSMENT AND  PLAN:  Ms.Shalina was seen today for medical management of chronic issues.  Diagnoses and all orders for this visit: Lab Results  Component Value Date   HGBA1C 5.8 11/24/2022    Chronic left-sided low back pain with left-sided sciatica Assessment & Plan: Symptoms have greatly improved with gabapentin. Continue gabapentin 100 mg at bedtime. We discussed some side effects of NSAID's, she can take Meloxicam 7.5 to 15 mg daily as needed. Do not use Volteren gel.   Type 2 diabetes mellitus with other specified complication, without long-term current use of insulin (HCC) Assessment & Plan: Comorbidities: OSA, hypertension, and hyperlipidemia. HgA1C at goal, it went from 6.1 to 5.8. Continue nonpharmacologic treatment. Regular low-impact exercise and healthy diet to continue. Annual eye exam, periodic dental and foot care recommended. F/U in 6 months.  Orders: -     POCT glycosylated hemoglobin (Hb A1C)  HTN (hypertension), benign Assessment & Plan: BP adequately controlled. Continue amlodipine 5 mg daily as well as low-salt diet. Continue monitoring BP regularly. Eye exam is current.  Generalized osteoarthritis of multiple sites Assessment & Plan: We discussed diagnosis, prognosis, and treatment options. For now she is not interested in adding duloxetine. Continue meloxicam, dose increased from 7.5 to 15 mg daily as needed.  We discussed side effects of chronic use of NSAIDs. Low impact exercise, tai chi, recommended.  Osteoporosis with current pathological fracture with routine healing, unspecified osteoporosis type, subsequent encounter Assessment & Plan: She reports being on Fosamax for at least 5 years but not sure,if she has she was instructed to discontinued. Continue Ca++ and vit D supplementation. Regular exercise , including weightbearing exercises 2-3 times per week. Continue fall prevention. DEXA will be arranged.  Orders: -     DG Bone Density; Future  OSA  (obstructive sleep apnea) Assessment & Plan: Following with sleep specialist. Has not used CPAP, cannot tolerate it. Stressed the importance of keep trying. She is planning on follow up to discussed masks options. We discussed Dx and possible complications of not adequately treated.  Hordeolum externum of left lower eyelid Improving. Educated about Dx and treatment. Local heat a few times epr day. Monitor for new symptoms. F/U as needed.  I spent a total of 41 minutes in both face to face and non face to face activities for this visit on the date of this encounter. During this time history was obtained and documented, examination was performed, prior labs reviewed, and assessment/plan discussed.  Return in about 6 months (around 05/27/2023) for chronic problems.  Jodi Findling G. [place], MD  [facility] [place] office.

## 2022-11-24 ENCOUNTER — Encounter: Payer: Self-pay | Admitting: Family Medicine

## 2022-11-24 ENCOUNTER — Ambulatory Visit (INDEPENDENT_AMBULATORY_CARE_PROVIDER_SITE_OTHER): Payer: Medicare HMO | Admitting: Family Medicine

## 2022-11-24 VITALS — BP 120/70 | HR 87 | Temp 98.2°F | Resp 16 | Ht 60.0 in | Wt 146.0 lb

## 2022-11-24 DIAGNOSIS — H00015 Hordeolum externum left lower eyelid: Secondary | ICD-10-CM | POA: Diagnosis not present

## 2022-11-24 DIAGNOSIS — G4733 Obstructive sleep apnea (adult) (pediatric): Secondary | ICD-10-CM

## 2022-11-24 DIAGNOSIS — M1712 Unilateral primary osteoarthritis, left knee: Secondary | ICD-10-CM

## 2022-11-24 DIAGNOSIS — E1169 Type 2 diabetes mellitus with other specified complication: Secondary | ICD-10-CM

## 2022-11-24 DIAGNOSIS — G8929 Other chronic pain: Secondary | ICD-10-CM

## 2022-11-24 DIAGNOSIS — E7800 Pure hypercholesterolemia, unspecified: Secondary | ICD-10-CM

## 2022-11-24 DIAGNOSIS — M159 Polyosteoarthritis, unspecified: Secondary | ICD-10-CM | POA: Insufficient documentation

## 2022-11-24 DIAGNOSIS — I10 Essential (primary) hypertension: Secondary | ICD-10-CM | POA: Diagnosis not present

## 2022-11-24 DIAGNOSIS — M5442 Lumbago with sciatica, left side: Secondary | ICD-10-CM

## 2022-11-24 DIAGNOSIS — M8000XD Age-related osteoporosis with current pathological fracture, unspecified site, subsequent encounter for fracture with routine healing: Secondary | ICD-10-CM

## 2022-11-24 LAB — POCT GLYCOSYLATED HEMOGLOBIN (HGB A1C): HbA1c, POC (prediabetic range): 5.8 % (ref 5.7–6.4)

## 2022-11-24 NOTE — Patient Instructions (Addendum)
A few things to remember from today's visit:  Type 2 diabetes mellitus with other specified complication, without long-term current use of insulin (HCC) - Plan: POC HgB A1c  HTN (hypertension), benign  Primary osteoarthritis of left knee  Generalized osteoarthritis of multiple sites  Osteoporosis with current pathological fracture with routine healing, unspecified osteoporosis type, subsequent encounter - Plan: DG Bone Density  OSA (obstructive sleep apnea)  Meloxicam 15 mg diario maximo. Puede tomar Acetaminophen al mismo tiempo. Tai chi puede ayudar con el dolor de la artritis. No cambios en el resto.  If you need refills for medications you take chronically, please call your pharmacy. Do not use My Chart to request refills or for acute issues that need immediate attention. If you send a my chart message, it may take a few days to be addressed, specially if I am not in the office.  Please be sure medication list is accurate. If a new problem present, please set up appointment sooner than planned today.

## 2022-11-24 NOTE — Assessment & Plan Note (Signed)
She reports being on Fosamax for at least 5 years but not sure,if she has she was instructed to discontinued. Continue Ca++ and vit D supplementation. Regular exercise , including weightbearing exercises 2-3 times per week. Continue fall prevention. DEXA will be arranged.

## 2022-11-24 NOTE — Assessment & Plan Note (Addendum)
BP adequately controlled. Continue amlodipine 5 mg daily as well as low-salt diet. Continue monitoring BP regularly. Eye exam is current.

## 2022-11-24 NOTE — Assessment & Plan Note (Signed)
Symptoms have greatly improved with gabapentin. Continue gabapentin 100 mg at bedtime. We discussed some side effects of NSAID's, she can take Meloxicam 7.5 to 15 mg daily as needed. Do not use Volteren gel.

## 2022-11-24 NOTE — Assessment & Plan Note (Signed)
We discussed diagnosis, prognosis, and treatment options. For now she is not interested in adding duloxetine. Continue meloxicam, dose increased from 7.5 to 15 mg daily as needed.  We discussed side effects of chronic use of NSAIDs. Low impact exercise, tai chi, recommended.

## 2022-11-24 NOTE — Assessment & Plan Note (Signed)
Comorbidities: OSA, hypertension, and hyperlipidemia. HgA1C at goal, it went from 6.1 to 5.8. Continue nonpharmacologic treatment. Regular low-impact exercise and healthy diet to continue. Annual eye exam, periodic dental and foot care recommended. F/U in 6 months.

## 2022-11-26 NOTE — Assessment & Plan Note (Signed)
Following with sleep specialist. Has not used CPAP, cannot tolerate it. Stressed the importance of keep trying. She is planning on follow up to discussed masks options. We discussed Dx and possible complications of not adequately treated.

## 2022-11-28 ENCOUNTER — Other Ambulatory Visit: Payer: Self-pay | Admitting: Family Medicine

## 2022-11-28 DIAGNOSIS — G8929 Other chronic pain: Secondary | ICD-10-CM

## 2022-12-17 NOTE — Progress Notes (Signed)
[facility] Return Visit  SUBJECTIVE  History of Present Illness: Jodi [name] is a 77 y.o. female seen in follow-up for OAB. Plan at last visit was continue Gemtesa 75mg  daily. She has since also been diagnosed with OSA.  She reports she does not leak with her medication but if she misses doses she does have urge leakage.   Denies vaginal irritation or dryness. Denies SUI symptoms.    Past Medical History: Patient  has a past medical history of Anemia, Diabetes mellitus without complication (HCC), Hypertension, Insomnia, and Polio.   Past Surgical History: She  has a past surgical history that includes Cesarean section and Shoulder surgery (Left, 2010).   Medications: She has a current medication list which includes the following prescription(s): accu-chek softclix lancets, alendronate, amlodipine, atorvastatin, accu-chek guide me, calcium, diclofenac sodium, ferrous sulfate, fluticasone, gabapentin, accu-chek guide, hydrochlorothiazide, fusion plus, accu-chek fastclix lancet, loratadine, meclizine, meloxicam, omeprazole, senna-docusate, triamcinolone cream, vibegron, vitamin d (cholecalciferol), and gemtesa.   Allergies: Patient is allergic to pork-derived products, shellfish allergy, and shellfish allergy.   Social History: Patient  reports that she has never smoked. She has never used smokeless tobacco. She reports that she does not currently use alcohol. She reports that she does not use drugs.      OBJECTIVE     Physical Exam: Vitals:   12/18/22 1015 12/18/22 1018  BP: (!) 147/80 119/72  Pulse: 77 84   Gen: No apparent distress, A&O x 3.  Detailed Urogynecologic Evaluation:  Deferred.    ASSESSMENT AND PLAN    Ms. Top is a 77 y.o. with:  1. Overactive bladder   2. Urinary frequency    Patient to continue on Gemtesa 75mg  daily for OAB.  Encouraged her to call if she has symptoms of UTI as we can perform a UTI test in office to keep her  out of urgent care or ER.   Patient to return in 1 year for annual f/u of medication

## 2022-12-18 ENCOUNTER — Ambulatory Visit (INDEPENDENT_AMBULATORY_CARE_PROVIDER_SITE_OTHER): Payer: Medicaid Other | Admitting: Obstetrics and Gynecology

## 2022-12-18 ENCOUNTER — Encounter: Payer: Self-pay | Admitting: Obstetrics and Gynecology

## 2022-12-18 ENCOUNTER — Ambulatory Visit: Payer: Medicare HMO | Admitting: Obstetrics and Gynecology

## 2022-12-18 VITALS — BP 119/72 | HR 84

## 2022-12-18 DIAGNOSIS — N3281 Overactive bladder: Secondary | ICD-10-CM

## 2022-12-18 DIAGNOSIS — R350 Frequency of micturition: Secondary | ICD-10-CM | POA: Diagnosis not present

## 2022-12-18 MED ORDER — GEMTESA 75 MG PO TABS: ORAL_TABLET | ORAL | 11 refills | Status: DC

## 2022-12-18 NOTE — Patient Instructions (Signed)
Take the medication daily for overactive bladder

## 2022-12-25 ENCOUNTER — Telehealth: Payer: Self-pay | Admitting: Family Medicine

## 2022-12-25 NOTE — Telephone Encounter (Signed)
Patient states Dr. [place] wanted to know how long she had been taking Alendronate Sodium Tablets.  She has been taking it for 8 years.

## 2022-12-29 NOTE — Telephone Encounter (Signed)
Discontinue Fosamax. She has a DEXA pending, so I will make new recommendations according to results. Continue adequate calcium and vitamin D intake. Thanks, BJ

## 2022-12-30 ENCOUNTER — Encounter: Payer: Self-pay | Admitting: Family Medicine

## 2022-12-30 NOTE — Telephone Encounter (Signed)
Mailed to patient

## 2023-01-12 ENCOUNTER — Other Ambulatory Visit: Payer: Self-pay | Admitting: Family Medicine

## 2023-01-12 DIAGNOSIS — I10 Essential (primary) hypertension: Secondary | ICD-10-CM

## 2023-01-29 ENCOUNTER — Other Ambulatory Visit: Payer: Self-pay | Admitting: Family Medicine

## 2023-01-29 DIAGNOSIS — G8929 Other chronic pain: Secondary | ICD-10-CM

## 2023-02-05 ENCOUNTER — Other Ambulatory Visit: Payer: Self-pay | Admitting: Family Medicine

## 2023-02-05 DIAGNOSIS — M810 Age-related osteoporosis without current pathological fracture: Secondary | ICD-10-CM

## 2023-02-15 ENCOUNTER — Telehealth: Payer: Self-pay | Admitting: Family Medicine

## 2023-02-15 NOTE — Telephone Encounter (Signed)
Patient came into the office and had a question about a letter sent to her from Dr. [place].  Patient is requesting a call back from Dr. [place].  Patient doesn't speak [place].  We had to communicate using Google translate online.

## 2023-02-26 NOTE — Telephone Encounter (Signed)
Called [name] to answer questions she has about letter sent from the office. No answer but I left a message explaining that I recommend stopping Fosamax given the fact she has been taking it for over 5 years and that new recommendations will be given according to next DEXA result. I let her know I am going to be out of the office next week, so if she has any further question, as far as it is not something urgent, we can discuss it during her next visit.  Porter Nakama [place], MD

## 2023-03-17 NOTE — Progress Notes (Signed)
This encounter was created in error - please disregard.

## 2023-03-27 ENCOUNTER — Other Ambulatory Visit: Payer: Self-pay | Admitting: Family Medicine

## 2023-03-27 DIAGNOSIS — G8929 Other chronic pain: Secondary | ICD-10-CM

## 2023-03-29 DIAGNOSIS — Z1231 Encounter for screening mammogram for malignant neoplasm of breast: Secondary | ICD-10-CM | POA: Diagnosis not present

## 2023-04-05 NOTE — Progress Notes (Deleted)
PATIENT: Jodi [name] DOB: 1946/02/06  REASON FOR VISIT: follow up HISTORY FROM: patient  No chief complaint on file.    HISTORY OF PRESENT ILLNESS:  04/05/23 ALL:  Jodi [name] is a 77 y.o. female here today for follow up for OSA on CPAP. She was seen in consult with Dr Frances Furbish 02/2022 for concerns of snoring, excessive daytime sleepiness and witnessed apneas. She had PSG 07/2022 showing "overall mild obstructive sleep apnea, more pronounced during REM sleep with a total AHI of 9.7/hour, O2 nadir of 75%." She was advised to start AutoPAP.      HISTORY: (copied from Dr Teofilo Pod previous note)  Dear Dr. [place],   I saw your patient, Jodi [name], upon your kind request in my sleep clinic today for initial consultation of her sleep disorder, in particular, concern for underlying obstructive sleep apnea.  The patient is accompanied by [name], Spanish interpreter today.  As you know, Ms. Sones is a 77 year old right-handed woman with an underlying medical history of diabetes, lumbar degenerative disc disease, hypertension, anemia, history of polio, and overweight state, who reports snoring and excessive daytime somnolence as well as witnessed apneas by family.  I reviewed your office note from 01/16/2022.  Her Epworth sleepiness score is 6 out of 24, fatigue severity score is 33 out of 63.  Her son has noted pauses in her breathing and family reports loud snoring when she is around them.  She lives alone, she is divorced, she goes to bed between 10 and 10:30 PM but has trouble falling asleep, may be awake for a few hours.  Rise time is generally between 6 and 7 AM.  She does not have night to night nocturia but does occasionally wake up with a dull, achy headache.  She does not wake up rested.  She has no family history of sleep apnea as far she knows.  She has 2 sons, 1 son snores, the other does not.  She drinks caffeine in the form of coffee, 1 cup in the morning, she is a  non-smoker and does not drink any alcohol.   REVIEW OF SYSTEMS: Out of a complete 14 system review of symptoms, the patient complains only of the following symptoms, and all other reviewed systems are negative.  ESS: previously 6/24  ALLERGIES: Allergies  Allergen Reactions   Pork-Derived Products     Rash, joint pain   Shellfish Allergy    Shellfish Allergy Rash and Other (See Comments)    joint pain, GI pain    HOME MEDICATIONS: Outpatient Medications Prior to Visit  Medication Sig Dispense Refill   ACCU-CHEK SOFTCLIX LANCETS lancets Use as instructed to test blood glucose twice daily. 100 each 12   alendronate (FOSAMAX) 70 MG tablet TAKE 1 TABLET (70 MG TOTAL) BY MOUTH ONCE A WEEK. TAKE 1 TABLET (70 MG TOTAL) BY MOUTH ONCE A WEEK. 12 tablet 4   amLODipine (NORVASC) 5 MG tablet TAKE 1/2 TABLET POR VIA ORAL A DIARIO 45 tablet 3   atorvastatin (LIPITOR) 10 MG tablet TOME UNA TABLETA TODOS LOS DIAS 90 tablet 2   Blood Glucose Monitoring Suppl (ACCU-CHEK GUIDE ME) w/Device KIT Use as directed 1 kit 0   CALCIUM PO Take 1 tablet by mouth daily.     diclofenac Sodium (VOLTAREN) 1 % GEL APPLY 4 G TOPICALLY 4 (FOUR) TIMES DAILY. LEFT KNEE. 100 g 0   ferrous sulfate 325 (65 FE) MG tablet Take 1 tablet (325 mg total) by mouth daily  with breakfast. 90 tablet 1   fluticasone (FLONASE) 50 MCG/ACT nasal spray PLACE 1 SPRAY INTO BOTH NOSTRILS DAILY AS NEEDED FOR ALLERGIES OR RHINITIS. 48 mL 1   gabapentin (NEURONTIN) 100 MG capsule Take 1 capsule (100 mg total) by mouth at bedtime. 30 capsule 2   GEMTESA 75 MG TABS TOME UNA TABLETA TODOS LOS DIAS 30 tablet 11   glucose blood (ACCU-CHEK GUIDE) test strip Use as instructed 100 each 12   hydrochlorothiazide (HYDRODIURIL) 12.5 MG tablet TOME UNA TABLETA TODOS LOS DIAS 90 tablet 3   Iron-FA-B Cmp-C-Biot-Probiotic (FUSION PLUS) CAPS Take 1 tablet by mouth every other day. As tolerated 90 capsule 0   Lancets Misc. (ACCU-CHEK FASTCLIX LANCET) KIT Use as  directed 1 kit 6   loratadine (CLARITIN) 10 MG tablet TOME UNA TABLETA TODOS LOS DIAS 90 tablet 1   meclizine (ANTIVERT) 12.5 MG tablet Take 1 tablet (12.5 mg total) by mouth daily as needed for dizziness. 30 tablet 0   meloxicam (MOBIC) 7.5 MG tablet TOME UNA TABLETA POR VIA ORAL A DIARIO CUANDO SEA NECESARIO PARA EL DOLOR 30 tablet 1   omeprazole (PRILOSEC) 40 MG capsule TOME UNA CAPSULA TODOS LOS DIAS 90 capsule 2   senna-docusate (SENOKOT-S) 8.6-50 MG tablet Take 1 tablet by mouth at bedtime as needed for mild constipation. 120 tablet 3   triamcinolone cream (KENALOG) 0.1 % APLIQUE AL AREA AFECTADA DOS VECES AL DIA 30 g 1   Vibegron 75 MG TABS Take 1 tablet by mouth daily. 30 tablet 5   Vitamin D, Cholecalciferol, 10 MCG (400 UNIT) CHEW Chew 2 tablets (800 Units total) by mouth daily. 60 tablet 6   No facility-administered medications prior to visit.    PAST MEDICAL HISTORY: Past Medical History:  Diagnosis Date   Anemia    Diabetes mellitus without complication (HCC)    Hypertension    Insomnia    Polio    in childhood    PAST SURGICAL HISTORY: Past Surgical History:  Procedure Laterality Date   CESAREAN SECTION     SHOULDER SURGERY Left 2010    FAMILY HISTORY: Family History  Family history unknown: Yes    SOCIAL HISTORY: Social History   Socioeconomic History   Marital status: Unknown    Spouse name: Not on file   Number of children: Not on file   Years of education: Not on file   Highest education level: Not on file  Occupational History   Not on file  Tobacco Use   Smoking status: Never   Smokeless tobacco: Never  Vaping Use   Vaping status: Never Used  Substance and Sexual Activity   Alcohol use: Not Currently   Drug use: Never   Sexual activity: Not Currently  Other Topics Concern   Not on file  Social History Narrative   ** Merged History Encounter **    Caffiene; 1-cup coffee daily   Working: no retired   Programme researcher, broadcasting/film/video; Human resources officer.    Social  Determinants of Health   Financial Resource Strain: Low Risk  (04/16/2022)   Overall Financial Resource Strain (CARDIA)    Difficulty of Paying Living Expenses: Not hard at all  Food Insecurity: No Food Insecurity (04/16/2022)   Hunger Vital Sign    Worried About Running Out of Food in the Last Year: Never true    Ran Out of Food in the Last Year: Never true  Transportation Needs: No Transportation Needs (04/16/2022)   PRAPARE - Transportation    Lack of  Transportation (Medical): No    Lack of Transportation (Non-Medical): No  Physical Activity: Sufficiently Active (04/16/2022)   Exercise Vital Sign    Days of Exercise per Week: 5 days    Minutes of Exercise per Session: 60 min  Stress: No Stress Concern Present (04/16/2022)   [employer] of Occupational Health - Occupational Stress Questionnaire    Feeling of Stress : Not at all  Social Connections: Moderately Integrated (04/16/2022)   Social Connection and Isolation Panel [NHANES]    Frequency of Communication with Friends and Family: More than three times a week    Frequency of Social Gatherings with Friends and Family: More than three times a week    Attends Religious Services: More than 4 times per year    Active Member of [employer] or Organizations: Yes    Attends Engineer, structural: More than 4 times per year    Marital Status: Divorced  Intimate Partner Violence: Not At Risk (04/16/2022)   Humiliation, Afraid, Rape, and Kick questionnaire    Fear of Current or Ex-Partner: No    Emotionally Abused: No    Physically Abused: No    Sexually Abused: No     PHYSICAL EXAM  There were no vitals filed for this visit. There is no height or weight on file to calculate BMI.  Generalized: Well developed, in no acute distress  Cardiology: normal rate and rhythm, no murmur noted Respiratory: clear to auscultation bilaterally  Neurological examination  Mentation: Alert oriented to time, place, history taking. Follows all  commands speech and language fluent Cranial nerve II-XII: Pupils were equal round reactive to light. Extraocular movements were full, visual field were full  Motor: The motor testing reveals 5 over 5 strength of all 4 extremities. Good symmetric motor tone is noted throughout.  Gait and station: Gait is normal.    DIAGNOSTIC DATA (LABS, IMAGING, TESTING) - I reviewed patient records, labs, notes, testing and imaging myself where available.      No data to display           Lab Results  Component Value Date   WBC 5.5 07/21/2022   HGB 12.6 07/21/2022   HCT 38.0 07/21/2022   MCV 91.0 07/21/2022   PLT 385.0 07/21/2022      Component Value Date/Time   NA 140 07/21/2022 0947   NA 141 01/07/2021 1554   K 3.6 07/21/2022 0947   CL 103 07/21/2022 0947   CO2 29 07/21/2022 0947   GLUCOSE 96 07/21/2022 0947   BUN 22 07/21/2022 0947   BUN 16 01/07/2021 1554   CREATININE 0.57 07/21/2022 0947   CREATININE 0.72 03/25/2018 0912   CREATININE 0.66 10/05/2016 1536   CALCIUM 10.1 07/21/2022 0947   PROT 7.6 07/21/2022 0947   PROT 7.4 01/07/2021 1554   ALBUMIN 4.2 07/21/2022 0947   ALBUMIN 4.5 01/07/2021 1554   AST 23 07/21/2022 0947   AST 19 03/25/2018 0912   ALT 19 07/21/2022 0947   ALT 13 03/25/2018 0912   ALKPHOS 84 07/21/2022 0947   BILITOT 0.7 07/21/2022 0947   BILITOT 0.3 01/07/2021 1554   BILITOT 0.6 03/25/2018 0912   GFRNONAA 88 05/12/2019 0907   GFRNONAA >60 03/25/2018 0912   GFRNONAA 89 10/05/2016 1536   GFRAA 101 05/12/2019 0907   GFRAA >60 03/25/2018 0912   GFRAA >89 10/05/2016 1536   Lab Results  Component Value Date   CHOL 184 07/21/2022   HDL 72.60 07/21/2022   LDLCALC 91 07/21/2022  TRIG 100.0 07/21/2022   CHOLHDL 3 07/21/2022   Lab Results  Component Value Date   HGBA1C 5.8 11/24/2022   Lab Results  Component Value Date   VITAMINB12 998 (H) 02/13/2021   Lab Results  Component Value Date   TSH 1.27 02/13/2021     ASSESSMENT AND PLAN 77 y.o.  year old female  has a past medical history of Anemia, Diabetes mellitus without complication (HCC), Hypertension, Insomnia, and Polio. here with   No diagnosis found.    Alycia Patten [name] is doing well on CPAP therapy. Compliance report reveals ***. *** was encouraged to continue using CPAP nightly and for greater than 4 hours each night. We will update supply orders as indicated. Risks of untreated sleep apnea review and education materials provided. Healthy lifestyle habits encouraged. *** will follow up in ***, sooner if needed. *** verbalizes understanding and agreement with this plan.    No orders of the defined types were placed in this encounter.    No orders of the defined types were placed in this encounter.     Shawnie Dapper, FNP-C 04/05/2023, 12:22 PM [facility] [address] [place], [place] [address] [phone]

## 2023-04-06 ENCOUNTER — Ambulatory Visit: Payer: Medicare HMO | Admitting: Family Medicine

## 2023-04-17 DIAGNOSIS — Z0130 Encounter for examination of blood pressure without abnormal findings: Secondary | ICD-10-CM | POA: Diagnosis not present

## 2023-04-17 DIAGNOSIS — R109 Unspecified abdominal pain: Secondary | ICD-10-CM | POA: Diagnosis not present

## 2023-04-17 DIAGNOSIS — R1110 Vomiting, unspecified: Secondary | ICD-10-CM | POA: Diagnosis not present

## 2023-04-18 ENCOUNTER — Emergency Department (HOSPITAL_BASED_OUTPATIENT_CLINIC_OR_DEPARTMENT_OTHER): Payer: Medicare HMO

## 2023-04-18 ENCOUNTER — Emergency Department (HOSPITAL_BASED_OUTPATIENT_CLINIC_OR_DEPARTMENT_OTHER)
Admission: EM | Admit: 2023-04-18 | Discharge: 2023-04-19 | Disposition: A | Discharge status: Home / Self Care | Payer: Medicare HMO | Attending: Emergency Medicine | Admitting: Emergency Medicine

## 2023-04-18 ENCOUNTER — Other Ambulatory Visit: Payer: Self-pay

## 2023-04-18 ENCOUNTER — Encounter (HOSPITAL_BASED_OUTPATIENT_CLINIC_OR_DEPARTMENT_OTHER): Payer: Self-pay | Admitting: Emergency Medicine

## 2023-04-18 DIAGNOSIS — R030 Elevated blood-pressure reading, without diagnosis of hypertension: Secondary | ICD-10-CM | POA: Diagnosis not present

## 2023-04-18 DIAGNOSIS — I10 Essential (primary) hypertension: Secondary | ICD-10-CM | POA: Diagnosis not present

## 2023-04-18 DIAGNOSIS — R112 Nausea with vomiting, unspecified: Secondary | ICD-10-CM | POA: Diagnosis not present

## 2023-04-18 DIAGNOSIS — K5730 Diverticulosis of large intestine without perforation or abscess without bleeding: Secondary | ICD-10-CM | POA: Diagnosis not present

## 2023-04-18 DIAGNOSIS — Z79899 Other long term (current) drug therapy: Secondary | ICD-10-CM | POA: Diagnosis not present

## 2023-04-18 DIAGNOSIS — E119 Type 2 diabetes mellitus without complications: Secondary | ICD-10-CM | POA: Insufficient documentation

## 2023-04-18 DIAGNOSIS — D72829 Elevated white blood cell count, unspecified: Secondary | ICD-10-CM | POA: Diagnosis not present

## 2023-04-18 DIAGNOSIS — K449 Diaphragmatic hernia without obstruction or gangrene: Secondary | ICD-10-CM | POA: Diagnosis not present

## 2023-04-18 DIAGNOSIS — N200 Calculus of kidney: Secondary | ICD-10-CM | POA: Diagnosis not present

## 2023-04-18 DIAGNOSIS — N132 Hydronephrosis with renal and ureteral calculous obstruction: Secondary | ICD-10-CM | POA: Diagnosis not present

## 2023-04-18 DIAGNOSIS — R109 Unspecified abdominal pain: Secondary | ICD-10-CM | POA: Diagnosis not present

## 2023-04-18 DIAGNOSIS — R1110 Vomiting, unspecified: Secondary | ICD-10-CM | POA: Diagnosis not present

## 2023-04-18 DIAGNOSIS — R1032 Left lower quadrant pain: Secondary | ICD-10-CM | POA: Diagnosis present

## 2023-04-18 LAB — URINALYSIS, ROUTINE W REFLEX MICROSCOPIC
Bacteria, UA: NONE SEEN
Bilirubin Urine: NEGATIVE
Glucose, UA: NEGATIVE mg/dL
Ketones, ur: 40 mg/dL — AB
Nitrite: NEGATIVE
RBC / HPF: >50 RBC/hpf (ref 0–5)
Specific Gravity, Urine: 1.026 (ref 1.005–1.030)
WBC, UA: >50 WBC/hpf (ref 0–5)
pH: 5.5 (ref 5.0–8.0)

## 2023-04-18 LAB — BASIC METABOLIC PANEL
Anion gap: 11 (ref 5–15)
BUN: 21 mg/dL (ref 8–23)
CO2: 25 mmol/L (ref 22–32)
Calcium: 9.5 mg/dL (ref 8.9–10.3)
Chloride: 103 mmol/L (ref 98–111)
Creatinine, Ser: 0.72 mg/dL (ref 0.44–1.00)
GFR, Estimated: >60 mL/min (ref >60)
Glucose, Bld: 113 mg/dL — ABNORMAL HIGH (ref 70–99)
Potassium: 3.7 mmol/L (ref 3.5–5.1)
Sodium: 139 mmol/L (ref 135–145)

## 2023-04-18 LAB — CBC WITH DIFFERENTIAL/PLATELET
Abs Immature Granulocytes: 0.05 10*3/uL (ref 0.00–0.07)
Basophils Absolute: 0.1 10*3/uL (ref 0.0–0.1)
Basophils Relative: 1 %
Eosinophils Absolute: 0 10*3/uL (ref 0.0–0.5)
Eosinophils Relative: 0 %
HCT: 38 % (ref 36.0–46.0)
Hemoglobin: 12.5 g/dL (ref 12.0–15.0)
Immature Granulocytes: 1 %
Lymphocytes Relative: 19 %
Lymphs Abs: 2.1 10*3/uL (ref 0.7–4.0)
MCH: 30.1 pg (ref 26.0–34.0)
MCHC: 32.9 g/dL (ref 30.0–36.0)
MCV: 91.6 fL (ref 80.0–100.0)
Monocytes Absolute: 0.7 10*3/uL (ref 0.1–1.0)
Monocytes Relative: 7 %
Neutro Abs: 8.1 10*3/uL — ABNORMAL HIGH (ref 1.7–7.7)
Neutrophils Relative %: 72 %
Platelets: 378 10*3/uL (ref 150–400)
RBC: 4.15 MIL/uL (ref 3.87–5.11)
RDW: 14 % (ref 11.5–15.5)
WBC: 11 10*3/uL — ABNORMAL HIGH (ref 4.0–10.5)
nRBC: 0 % (ref 0.0–0.2)

## 2023-04-18 MED ORDER — MORPHINE SULFATE (PF) 4 MG/ML IV SOLN
4.0000 mg | Freq: Once | INTRAVENOUS | Status: AC
  Administered 2023-04-18: 4 mg via INTRAVENOUS
  Filled 2023-04-18: qty 1

## 2023-04-18 MED ORDER — SODIUM CHLORIDE 0.9 % IV BOLUS
1000.0000 mL | Freq: Once | INTRAVENOUS | Status: AC
  Administered 2023-04-19: 1000 mL via INTRAVENOUS

## 2023-04-18 MED ORDER — ONDANSETRON HCL 4 MG/2ML IJ SOLN
4.0000 mg | Freq: Once | INTRAMUSCULAR | Status: AC
  Administered 2023-04-18: 4 mg via INTRAVENOUS
  Filled 2023-04-18: qty 2

## 2023-04-18 NOTE — ED Notes (Signed)
Patient ambulated to restroom with one person assistance

## 2023-04-18 NOTE — ED Notes (Signed)
ED Provider at bedside. 

## 2023-04-18 NOTE — ED Triage Notes (Signed)
Left flank pain. Started Friday night Some vomiting on Friday. Seen at [facility], UA done neg.repeat urine today some blood in urine.  Sent to eval for kidney stone. Gave pain shot around 5:30pm

## 2023-04-18 NOTE — ED Notes (Signed)
Patient reports that she has pain and burning with urination.

## 2023-04-19 DIAGNOSIS — N132 Hydronephrosis with renal and ureteral calculous obstruction: Secondary | ICD-10-CM | POA: Diagnosis not present

## 2023-04-19 MED ORDER — TAMSULOSIN HCL 0.4 MG PO CAPS: 0.4000 mg | ORAL_CAPSULE | Freq: Every day | ORAL | 0 refills | Status: DC

## 2023-04-19 MED ORDER — KETOROLAC TROMETHAMINE 30 MG/ML IJ SOLN
30.0000 mg | Freq: Once | INTRAMUSCULAR | Status: AC
  Administered 2023-04-19: 30 mg via INTRAVENOUS
  Filled 2023-04-19: qty 1

## 2023-04-19 MED ORDER — CEPHALEXIN 250 MG PO CAPS
500.0000 mg | ORAL_CAPSULE | Freq: Once | ORAL | Status: AC
  Administered 2023-04-19: 500 mg via ORAL
  Filled 2023-04-19: qty 2

## 2023-04-19 MED ORDER — CEPHALEXIN 500 MG PO CAPS: 500.0000 mg | ORAL_CAPSULE | Freq: Three times a day (TID) | ORAL | 0 refills | Status: DC

## 2023-04-19 MED ORDER — ONDANSETRON 4 MG PO TBDP: 4.0000 mg | ORAL_TABLET | Freq: Three times a day (TID) | ORAL | 0 refills | Status: DC | PRN

## 2023-04-19 MED ORDER — OXYCODONE-ACETAMINOPHEN 5-325 MG PO TABS
1.0000 | ORAL_TABLET | Freq: Four times a day (QID) | ORAL | 0 refills | Status: DC | PRN
Start: 2023-04-19 — End: 2023-05-07

## 2023-04-19 NOTE — Discharge Instructions (Signed)
You were seen today for flank pain.  You do have evidence of a kidney stone.  Take medications as prescribed.  If you develop fevers or worsening symptoms, you should be reevaluated immediately.  Follow-up with urology.

## 2023-04-19 NOTE — ED Notes (Signed)
Called lab and spoke with Roxanne about adding on urine culture to urine in lab.

## 2023-04-19 NOTE — ED Provider Notes (Signed)
[place] EMERGENCY DEPARTMENT AT [facility] Provider Note   CSN: [id number] Arrival date & time: 04/18/23  1726     History  Chief Complaint  Patient presents with   Flank Pain    Jodi [name] is a 77 y.o. female.  HPI     This is a 77 year old female who presents with flank pain.  Reports pain started on Friday night.  Has had some nausea and vomiting.  It radiates from the left flank into the left lower quadrant.  Was seen at [facility] and noted to have some blood.  They were concerned regarding a kidney stone.  She has no history of kidney stones.  Denies fever.  Denies dysuria.  Was given a pain shot but states that her pain has returned and is now "11 out of 10."  Home Medications Prior to Admission medications   Medication Sig Start Date End Date Taking? Authorizing Provider  cephALEXin (KEFLEX) 500 MG capsule Take 1 capsule (500 mg total) by mouth 3 (three) times daily. 04/19/23  Yes Taggert Bozzi, Mayer Masker, MD  ondansetron (ZOFRAN-ODT) 4 MG disintegrating tablet Take 1 tablet (4 mg total) by mouth every 8 (eight) hours as needed. 04/19/23  Yes Breyson Kelm, Mayer Masker, MD  oxyCODONE-acetaminophen (PERCOCET/ROXICET) 5-325 MG tablet Take 1 tablet by mouth every 6 (six) hours as needed for severe pain. 04/19/23  Yes Daemien Fronczak, Mayer Masker, MD  tamsulosin (FLOMAX) 0.4 MG CAPS capsule Take 1 capsule (0.4 mg total) by mouth daily. 04/19/23  Yes Bonham Zingale, Mayer Masker, MD  ACCU-CHEK SOFTCLIX LANCETS lancets Use as instructed to test blood glucose twice daily. 07/15/16   Pete Glatter, MD  alendronate (FOSAMAX) 70 MG tablet TAKE 1 TABLET (70 MG TOTAL) BY MOUTH ONCE A WEEK. TAKE 1 TABLET (70 MG TOTAL) BY MOUTH ONCE A WEEK. 02/05/23   [place], Betty G, MD  amLODipine (NORVASC) 5 MG tablet TAKE 1/2 TABLET POR VIA ORAL A DIARIO 01/12/23   [place], Betty G, MD  atorvastatin (LIPITOR) 10 MG tablet TOME UNA TABLETA TODOS LOS DIAS 10/19/22   [place], Betty G, MD  Blood Glucose Monitoring  Suppl (ACCU-CHEK GUIDE ME) w/Device KIT Use as directed 01/15/20   Marcine Matar, MD  CALCIUM PO Take 1 tablet by mouth daily.    [provider]  diclofenac Sodium (VOLTAREN) 1 % GEL APPLY 4 G TOPICALLY 4 (FOUR) TIMES DAILY. LEFT KNEE. 02/20/22   Claiborne Rigg, NP  ferrous sulfate 325 (65 FE) MG tablet Take 1 tablet (325 mg total) by mouth daily with breakfast. 01/07/21   Marcine Matar, MD  fluticasone (FLONASE) 50 MCG/ACT nasal spray PLACE 1 SPRAY INTO BOTH NOSTRILS DAILY AS NEEDED FOR ALLERGIES OR RHINITIS. 06/06/21   Marcine Matar, MD  gabapentin (NEURONTIN) 100 MG capsule Take 1 capsule (100 mg total) by mouth at bedtime. 08/06/22   [place], Betty G, MD  GEMTESA 75 MG TABS TOME UNA TABLETA TODOS LOS DIAS 12/18/22   Selmer Dominion, NP  glucose blood (ACCU-CHEK GUIDE) test strip Use as instructed 01/15/20   Marcine Matar, MD  hydrochlorothiazide (HYDRODIURIL) 12.5 MG tablet TOME UNA TABLETA TODOS LOS DIAS 01/12/23   [place], Betty G, MD  Iron-FA-B Cmp-C-Biot-Probiotic (FUSION PLUS) CAPS Take 1 tablet by mouth every other day. As tolerated 01/12/22   Philip Aspen, Limmie Patricia, MD  Lancets Misc. (ACCU-CHEK FASTCLIX LANCET) KIT Use as directed 01/15/20   Marcine Matar, MD  loratadine (CLARITIN) 10 MG tablet TOME UNA TABLETA  TODOS LOS DIAS 06/06/21   Marcine Matar, MD  meclizine (ANTIVERT) 12.5 MG tablet Take 1 tablet (12.5 mg total) by mouth daily as needed for dizziness. 05/09/21   Marcine Matar, MD  meloxicam (MOBIC) 7.5 MG tablet TOME UNA TABLETA POR VIA ORAL A DIARIO CUANDO SEA NECESARIO PARA EL DOLOR 03/29/23   [place], Betty G, MD  omeprazole (PRILOSEC) 40 MG capsule TOME UNA CAPSULA TODOS LOS DIAS 10/19/22   [place], Betty G, MD  senna-docusate (SENOKOT-S) 8.6-50 MG tablet Take 1 tablet by mouth at bedtime as needed for mild constipation. 10/07/16   Pete Glatter, MD  triamcinolone cream (KENALOG) 0.1 % APLIQUE AL AREA AFECTADA DOS VECES AL DIA 05/16/21    Marcine Matar, MD  Vibegron 75 MG TABS Take 1 tablet by mouth daily. 08/18/21   Philip Aspen, Limmie Patricia, MD  Vitamin D, Cholecalciferol, 10 MCG (400 UNIT) CHEW Chew 2 tablets (800 Units total) by mouth daily. 09/15/19   Marcine Matar, MD      Allergies    Pork-derived products, Shellfish allergy, and Shellfish allergy    Review of Systems   Review of Systems  Constitutional:  Negative for fever.  Respiratory:  Negative for shortness of breath.   Cardiovascular:  Negative for chest pain.  Genitourinary:  Positive for flank pain and hematuria. Negative for dysuria.  All other systems reviewed and are negative.   Physical Exam Updated Vital Signs BP 131/76   Pulse 72   Temp 98.1 F (36.7 C) (Oral)   Resp 18   Ht 1.524 m (5')   Wt 66.2 kg   SpO2 93%   BMI 28.50 kg/m  Physical Exam Vitals and nursing note reviewed.  Constitutional:      Appearance: She is well-developed.     Comments: Uncomfortable appearing but nontoxic  HENT:     Head: Normocephalic and atraumatic.  Eyes:     Pupils: Pupils are equal, round, and reactive to light.  Cardiovascular:     Rate and Rhythm: Normal rate and regular rhythm.     Heart sounds: Normal heart sounds.  Pulmonary:     Effort: Pulmonary effort is normal. No respiratory distress.     Breath sounds: No wheezing.  Abdominal:     General: Bowel sounds are normal.     Palpations: Abdomen is soft.     Tenderness: There is no abdominal tenderness. There is no right CVA tenderness or left CVA tenderness.  Musculoskeletal:     Cervical back: Neck supple.  Skin:    General: Skin is warm and dry.  Neurological:     Mental Status: She is alert and oriented to person, place, and time.  Psychiatric:        Mood and Affect: Mood normal.     ED Results / Procedures / Treatments   Labs (all labs ordered are listed, but only abnormal results are displayed) Labs Reviewed  CBC WITH DIFFERENTIAL/PLATELET - Abnormal; Notable for the  following components:      Result Value   WBC 11.0 (*)    Neutro Abs 8.1 (*)    All other components within normal limits  BASIC METABOLIC PANEL - Abnormal; Notable for the following components:   Glucose, Bld 113 (*)    All other components within normal limits  URINALYSIS, ROUTINE W REFLEX MICROSCOPIC - Abnormal; Notable for the following components:   APPearance HAZY (*)    Hgb urine dipstick LARGE (*)    Ketones, ur  40 (*)    Protein, ur TRACE (*)    Leukocytes,Ua LARGE (*)    Non Squamous Epithelial 0-5 (*)    All other components within normal limits  URINE CULTURE    EKG None  Radiology CT Renal Stone Study  Result Date: 04/18/2023 CLINICAL DATA:  Left-sided flank pain for 2 days, initial encounter EXAM: CT ABDOMEN AND PELVIS WITHOUT CONTRAST TECHNIQUE: Multidetector CT imaging of the abdomen and pelvis was performed following the standard protocol without IV contrast. RADIATION DOSE REDUCTION: This exam was performed according to the departmental dose-optimization program which includes automated exposure control, adjustment of the mA and/or kV according to patient size and/or use of iterative reconstruction technique. COMPARISON:  03/26/2005 FINDINGS: Lower chest: No acute abnormality. Hepatobiliary: No focal liver abnormality is seen. No gallstones, gallbladder wall thickening, or biliary dilatation. Pancreas: Unremarkable. No pancreatic ductal dilatation or surrounding inflammatory changes. Spleen: Normal in size without focal abnormality. Adrenals/Urinary Tract: Adrenal glands are within normal limits. Right kidney shows no obstructive changes. Left kidney demonstrates hydronephrosis and proximal hydroureter with relative increased density within the obstructed collecting system likely related to hematuria. 5 mm stone is noted in the left mid ureter causing the obstructive change. The more distal ureter is within normal limits. The bladder is decompressed. Stomach/Bowel: Mild  diverticular change of the colon is noted. No evidence of diverticulitis is seen. The appendix is within normal limits. Small bowel shows no obstructive change. Stomach shows a large hiatal hernia with more than half of the stomach within the chest cavity. Vascular/Lymphatic: Aortic atherosclerosis. No enlarged abdominal or pelvic lymph nodes. Reproductive: Uterus and bilateral adnexa are unremarkable. Other: No abdominal wall hernia or abnormality. No abdominopelvic ascites. Musculoskeletal: Degenerative changes of the lumbar spine are noted. Scoliosis concave to the left is noted. IMPRESSION: 5 mm left mid ureteral stone causing obstructive change. Increased density of the obstructed urine is noted which may represent blood within the collecting system. Diverticulosis without diverticulitis. Large hiatal hernia. Electronically Signed   By: Alcide Clever M.D.   On: 04/18/2023 23:50    Procedures Procedures    Medications Ordered in ED Medications  morphine (PF) 4 MG/ML injection 4 mg (4 mg Intravenous Given 04/18/23 2328)  ondansetron (ZOFRAN) injection 4 mg (4 mg Intravenous Given 04/18/23 2324)  sodium chloride 0.9 % bolus 1,000 mL (0 mLs Intravenous Stopped 04/19/23 0150)  ketorolac (TORADOL) 30 MG/ML injection 30 mg (30 mg Intravenous Given 04/19/23 0049)    ED Course/ Medical Decision Making/ A&P                                 Medical Decision Making Amount and/or Complexity of Data Reviewed Labs: ordered. Radiology: ordered.  Risk Prescription drug management.   This patient presents to the ED for concern of flank pain, this involves an extensive number of treatment options, and is a complaint that carries with it a high risk of complications and morbidity.  I considered the following differential and admission for this acute, potentially life threatening condition.  The differential diagnosis includes kidney stone, pyelonephritis, musculoskeletal  MDM:    This is a 77 year old  female who presents with flank pain.  She is nontoxic and afebrile.  Vital signs are reassuring.  Denies infectious symptoms such as fever.  No CVA tenderness.  Labs obtained and reviewed.  Suspect kidney stone given clinical history.  Patient was given fluids, pain, nausea medication.  She had significant improvement of her symptoms.  Labs notable for slight leukocytosis to 11.  Urinalysis shows greater than 50 red cells, greater than 50 white cells, no bacteria.  Will send culture.  Given her age, would have low threshold to treat for infection.  CT scan does confirm obstructing stone on the left.  Again on recheck, patient has had improvement of symptoms.  Will send urine for culture.  Will start on Keflex, Flomax, pain and nausea medication.  Recommend close urology follow-up.  Stressed to patient and her family importance of returning immediately if she develops fevers or systemic symptoms.  (Labs, imaging, consults)  Labs: I Ordered, and personally interpreted labs.  The pertinent results include: CBC, BMP, urinalysis, urine culture  Imaging Studies ordered: I ordered imaging studies including CT stone study I independently visualized and interpreted imaging. I agree with the radiologist interpretation  Additional history obtained from chart review.  External records from outside source obtained and reviewed including prior evaluations  Cardiac Monitoring: The patient was maintained on a cardiac monitor.  If on the cardiac monitor, I personally viewed and interpreted the cardiac monitored which showed an underlying rhythm of: Sinus rhythm  Reevaluation: After the interventions noted above, I reevaluated the patient and found that they have :stayed the same  Social Determinants of Health:  lives independently  Disposition: Discharge  Co morbidities that complicate the patient evaluation  Past Medical History:  Diagnosis Date   Anemia    Diabetes mellitus without complication (HCC)     Hypertension    Insomnia    Polio    in childhood     Medicines Meds ordered this encounter  Medications   morphine (PF) 4 MG/ML injection 4 mg   ondansetron (ZOFRAN) injection 4 mg   sodium chloride 0.9 % bolus 1,000 mL   ketorolac (TORADOL) 30 MG/ML injection 30 mg   tamsulosin (FLOMAX) 0.4 MG CAPS capsule    Sig: Take 1 capsule (0.4 mg total) by mouth daily.    Dispense:  30 capsule    Refill:  0   oxyCODONE-acetaminophen (PERCOCET/ROXICET) 5-325 MG tablet    Sig: Take 1 tablet by mouth every 6 (six) hours as needed for severe pain.    Dispense:  10 tablet    Refill:  0   ondansetron (ZOFRAN-ODT) 4 MG disintegrating tablet    Sig: Take 1 tablet (4 mg total) by mouth every 8 (eight) hours as needed.    Dispense:  20 tablet    Refill:  0   cephALEXin (KEFLEX) 500 MG capsule    Sig: Take 1 capsule (500 mg total) by mouth 3 (three) times daily.    Dispense:  21 capsule    Refill:  0    I have reviewed the patients home medicines and have made adjustments as needed  Problem List / ED Course: Problem List Items Addressed This Visit   None Visit Diagnoses     Kidney stone    -  Primary   Relevant Medications   morphine (PF) 4 MG/ML injection 4 mg (Completed)   oxyCODONE-acetaminophen (PERCOCET/ROXICET) 5-325 MG tablet                   Final Clinical Impression(s) / ED Diagnoses Final diagnoses:  Kidney stone    Rx / DC Orders ED Discharge Orders          Ordered    tamsulosin (FLOMAX) 0.4 MG CAPS capsule  Daily  04/19/23 0303    oxyCODONE-acetaminophen (PERCOCET/ROXICET) 5-325 MG tablet  Every 6 hours PRN        04/19/23 0303    ondansetron (ZOFRAN-ODT) 4 MG disintegrating tablet  Every 8 hours PRN        04/19/23 0303    cephALEXin (KEFLEX) 500 MG capsule  3 times daily        04/19/23 0304              Jamileth Putzier, Mayer Masker, MD 04/19/23 [phone]

## 2023-04-20 LAB — URINE CULTURE: Culture: NO GROWTH

## 2023-04-22 ENCOUNTER — Encounter (INDEPENDENT_AMBULATORY_CARE_PROVIDER_SITE_OTHER): Payer: Medicare HMO | Admitting: Family Medicine

## 2023-04-22 ENCOUNTER — Telehealth: Payer: Self-pay

## 2023-04-22 NOTE — Progress Notes (Signed)
error 

## 2023-04-22 NOTE — Telephone Encounter (Signed)
Attempt to reach [employer] several times. It did not work.   Spoke to pt using caregility virtual interpreter.  Name: Jodi [name] ID: [id number]  Interpreter help connecting with pt on his line. Was able to connect with pt.    Pt would like to reschedule her appt to another time as she is having kidney stone and will be seeing a kidney specialist next week and prefer to do medicare wellness after.   Appt is reschedule to Oct 17.   Pt is aware a nurse will reach out to her about 20-30 minutes early prior to her appt time.

## 2023-04-26 DIAGNOSIS — N201 Calculus of ureter: Secondary | ICD-10-CM | POA: Diagnosis not present

## 2023-04-27 ENCOUNTER — Other Ambulatory Visit: Payer: Self-pay | Admitting: Urology

## 2023-04-28 ENCOUNTER — Encounter (HOSPITAL_COMMUNITY): Payer: Self-pay

## 2023-04-28 NOTE — Patient Instructions (Addendum)
DEBIDO AL COVID-19 SLO SE PERMITEN DOS VISITANTES (de 16 aos en adelante)  PARA QUE VENGAN CON USTED Y SE QUEDEN EN LA SALA DE ESPERA SOLAMENTE DURANTE EL PRE OP Y EL PROCEDIMIENTO.   **NO SE PERMITEN VISITAS EN EL REA DE CORTA ESTADA NI EN LA SALA DE RECUPERACIN!!**  SI VA A SER INGRESADO(A) AL HOSPITAL SLO SE LE PERMITEN CUATRO PERSONAS DE APOYO DURANTE LAS HORAS DE VISITA (7 AM -8PM)   La(s) persona(s) de apoyo debe(n) pasar nuestra evaluacin, entrar y salir con gel y Facilities manager en todo momento, incluso en la habitacin del [place]. Los pacientes tambin deben usar una mscara cuando el personal o su persona de apoyo estn en la habitacin. Los visitantes DEBEN LLEVAR ETIQUETA DE VISITANTE DE UNA MANERA VISIBLE. Un visitante adulto Insurance claims handler con usted durante la noche y DEBE estar en la habitacin a las 8 P.M.     Su procedimiento est programado en: 05-07-23   Presntese a la entrada principal del [facility]     Presntese a admisiones por la maana 7:15 AM   Llame a este nmero si tiene [employer] maana de la ciruga al [phone]   No consuma alimentos o lquidos: Despus de la medianoche          Si tiene preguntas, por favor. Pngase en contacto con la oficina de su cirujano.   SIGA LA PREPARACIN INTESTINAL Y CUALQUIER INSTRUCCIN ADICIONAL PREOPERATORIA QUE HAYA RECIBIDO DEL OFICINA DE DU CIRUJANO!!!     La higiene bucal tambin es importante para reducir el riesgo de infeccin.                                   Recuerde - LVESE LOS DIENTES EN LA MAANA DE LA CIRUGA CON SU PASTA DENTAL HABITUAL   NO fume despus de la medianoche   [employer] medicamentos en la maana de la ciruga con UN SORBO DE AGUA:              Amlodipine  Atorvastatin  Gemtesa  Claritin  Omeprazole  Tamsulosin  Okay to use nasal spray  If needed Meclizine, Ondansetron, Oxycodone  NO TOME NINGN MEDICAMENTO ORAL PARA LA DIABETES EL DA DE LA  CIRUGA  Traiga la mascarilla CPAP y los tubos el da de la [place].                              No debe trae ningn metal en el cuerpo, incluyendo pinzas para el cabello, joyas, ni aretes/pendientes             No use maquillaje, lociones/cremas, polvos, perfumes/colonias o desodorante  No use esmalte de uas, incluyendo los de gel ni S&S, uas artificiales/acrlicas o cualquier otro tipo de cobertura en las uas naturales, incluyendo las uas de las manos y [employer]. Si tiene uas artificiales, con capas de gel, etc. que necesite que le quiten en un saln de uas, por favor, pida que se lo quiten antes de la ciruga o la ciruga podra ser cancelada/retrasada si el cirujano o el anestesilogo consideran que no puede ser monitoreado(a) de una forma segura.   No se rasure en las 48 horas antes de la operacin.               Los hombres pueden [employer] cara y el cuello.  No traiga objetos de valor al hospital. [place] NO SE HACE RESPONSABLE DE LOS OBJETOS DE VALOR.   Los contactos, las dentaduras o los puentes no se pueden usar durante la [place].   Elsworth Soho una bolsa pequea para la noche el da de la [place].   NO TRAIGA AL HOSPITAL LOS MEDICAMENTOS QUE TOMA EN CASA . LA FARMACIA LE SUMINISTRAR LOS MEDICAMENTOS QUE TENGA EN SU LISTA DE MEDICAMENTOS DURANTE SU ESTADA EN EL HOSPITAL!    Los pacientes dados de alta el mismo da de la ciruga no podrn Company secretary a casa.  Es NECESARIO que alguien se quede con usted durante las primeras 24 horas despus de la anestesia.   Instrucciones especiales: Dorna Bloom copia de sus documentos de poder notarial y testamento vital el da de su ciruga si no los ha escaneado antes.              Por favor, lea las siguientes hojas informativas que le dieron: SI TIENE PREGUNTAS SOBRE SUS INSTRUCCIONES PREOPERATORIAS POR FAVOR LLAME AL [phone] Antonietta Jewel PARA LA CIRUGA                                             Preparing for Surgery  Debido a que la piel no est esterilizada, sta necesita estar lo ms libre de grmenes como sea posible.  Usted puede reducir el nmero de grmenes en la piel lavndose con el jabn de CHG (Chlorahexidine gluconate) antes de la ciruga.  El CHG es un jabn antisptico el cual mata los grmenes y se une a la piel para continuar matando los grmenes incluso hasta despus de lavarse. POR FAVOR NO LO USE SI USTED TIENE ALERGIAS AL CHG.  SI LA PIEL SE IRRITA, DEJE DE USAR EL CHG.  NO SE RASURE DURANTE AL MENOS 12 HORAS ANTES DE LA PRIMERA DUCHA CON EL CHG. Siga estas instrucciones cuidadosamente:  Dchese la noche anterior a la [place] y de nuevo en la maana de la [place]. Si decide lavarse el cabello, lvelo con su champ normal primero. Enjuague el cabello y el cuerpo para quitarse el [place]. Use el CHG como lo hara con cualquier otro jabn lquido, usando una toallita o esponja vegetal o exfoliante. Aplique el CHG al cuerpo solamente DEL CUELLO PARA ABAJO.  No lo use cerca de los ojos o los genitales. No se lave con su jabn normal despus de usar el CHG. Squese con una toalla limpia. Espere hasta la maana siguiente para aplicarse desodorantes, lociones, excepto en el da de la [place], NO SE APLIQUE LOCIONES. Use pijamas limpias o una bata. Coloque sbanas limpias en su cama la noche de su primera ducha - no duerma con mascotas. 10.  Use ropa limpia al venir al hospital.         SURGICAL WAITING ROOM VISITATION Patients having surgery or a procedure may have no more than 2 support people in the waiting area - these visitors may rotate.    Children under the age of 43 must have an adult with them who is not the patient.  If the patient needs to stay at the hospital during part of their recovery, the visitor guidelines for inpatient rooms apply. Pre-op nurse will coordinate an appropriate  time for 1 support person to accompany patient in pre-op.  This support person  may not rotate.    Please refer to the [facility] website for the visitor guidelines for Inpatients (after your surgery is over and you are in a regular room).       Your procedure is scheduled on: 05-07-23   Report to [facility] Main Entrance    Report to admitting at 7:15 AM   Call this number if you have problems the morning of surgery [phone]   Do not eat food or drink liquids:After Midnight.           If you have questions, please contact your surgeon's office.   FOLLOW ANY ADDITIONAL PRE OP INSTRUCTIONS YOU RECEIVED FROM YOUR SURGEON'S OFFICE!!!     Oral Hygiene is also important to reduce your risk of infection.                                    Remember - BRUSH YOUR TEETH THE MORNING OF SURGERY WITH YOUR REGULAR TOOTHPASTE   Do NOT smoke after Midnight   Take these medicines the morning of surgery with A SIP OF WATER:   Amlodipine  Atorvastatin  Gemtesa  Claritin  Omeprazole  Tamsulosin  Okay to use nasal spray  If needed Meclizine, Ondansetron, Oxycodone  Stop all vitamins and herbal supplements 7 days before surgery  DO NOT TAKE ANY ORAL DIABETIC MEDICATIONS DAY OF YOUR SURGERY  Bring CPAP mask and tubing day of surgery.                              You may not have any metal on your body including hair pins, jewelry, and body piercing             Do not wear make-up, lotions, powders, perfumes or deodorant  Do not wear nail polish including gel and S&S, artificial/acrylic nails, or any other type of covering on natural nails including finger and toenails. If you have artificial nails, gel coating, etc. that needs to be removed by a nail salon please have this removed prior to surgery or surgery may need to be canceled/ delayed if the surgeon/ anesthesia feels like they are unable to be safely monitored.   Do not shave  48 hours prior to surgery.    Do not bring valuables to the hospital. [facility] IS NOT RESPONSIBLE   FOR  VALUABLES.   Contacts, dentures or bridgework may not be worn into surgery.  DO NOT BRING YOUR HOME MEDICATIONS TO THE HOSPITAL. PHARMACY WILL DISPENSE MEDICATIONS LISTED ON YOUR MEDICATION LIST TO YOU DURING YOUR ADMISSION IN THE HOSPITAL!    Patients discharged on the day of surgery will not be allowed to drive home.  Someone NEEDS to stay with you for the first 24 hours after anesthesia.   Special Instructions: Bring a copy of your healthcare power of attorney and living will documents the day of surgery if you haven't scanned them before.              Please read over the following fact sheets you were given: IF YOU HAVE QUESTIONS ABOUT YOUR PRE-OP INSTRUCTIONS PLEASE CALL [phone] Rosey Bath  If you received a COVID test during your pre-op visit  it is requested that you wear a mask when out in public, stay away from anyone  that may not be feeling well and notify your surgeon if you develop symptoms. If you test positive for Covid or have been in contact with anyone that has tested positive in the last 10 days please notify you surgeon.  [place] - Preparing for Surgery Before surgery, you can play an important role.  Because skin is not sterile, your skin needs to be as free of germs as possible.  You can reduce the number of germs on your skin by washing with CHG (chlorahexidine gluconate) soap before surgery.  CHG is an antiseptic cleaner which kills germs and bonds with the skin to continue killing germs even after washing. Please DO NOT use if you have an allergy to CHG or antibacterial soaps.  If your skin becomes reddened/irritated stop using the CHG and inform your nurse when you arrive at Short Stay. Do not shave (including legs and underarms) for at least 48 hours prior to the first CHG shower.  You may shave your face/neck.  Please follow these instructions carefully:  1.  Shower with CHG Soap the night before surgery and the  morning of surgery.  2.  If you choose to wash  your hair, wash your hair first as usual with your normal  shampoo.  3.  After you shampoo, rinse your hair and body thoroughly to remove the shampoo.                             4.  Use CHG as you would any other liquid soap.  You can apply chg directly to the skin and wash.  Gently with a scrungie or clean washcloth.  5.  Apply the CHG Soap to your body ONLY FROM THE NECK DOWN.   Do   not use on face/ open                           Wound or open sores. Avoid contact with eyes, ears mouth and   genitals (private parts).                       Wash face,  Genitals (private parts) with your normal soap.             6.  Wash thoroughly, paying special attention to the area where your    surgery  will be performed.  7.  Thoroughly rinse your body with warm water from the neck down.  8.  DO NOT shower/wash with your normal soap after using and rinsing off the CHG Soap.                9.  Pat yourself dry with a clean towel.            10.  Wear clean pajamas.            11.  Place clean sheets on your bed the night of your first shower and do not  sleep with pets. Day of Surgery : Do not apply any lotions/deodorants the morning of surgery.  Please wear clean clothes to the hospital/surgery center.  FAILURE TO FOLLOW THESE INSTRUCTIONS MAY RESULT IN THE CANCELLATION OF YOUR SURGERY  PATIENT SIGNATURE_________________________________  NURSE SIGNATURE__________________________________  ________________________________________________________________________

## 2023-04-29 ENCOUNTER — Inpatient Hospital Stay (HOSPITAL_COMMUNITY)
Admission: RE | Admit: 2023-04-29 | Discharge: 2023-04-29 | Disposition: A | Discharge status: Home / Self Care | Payer: Medicare HMO | Source: Ambulatory Visit

## 2023-04-29 DIAGNOSIS — E119 Type 2 diabetes mellitus without complications: Secondary | ICD-10-CM

## 2023-04-29 DIAGNOSIS — I10 Essential (primary) hypertension: Secondary | ICD-10-CM

## 2023-04-29 HISTORY — DX: Personal history of urinary calculi: Z87.442

## 2023-04-29 NOTE — Patient Instructions (Signed)
DEBIDO AL COVID-19 SLO SE PERMITEN DOS VISITANTES (de 16 aos en adelante)  PARA QUE VENGAN CON USTED Y SE QUEDEN EN LA SALA DE ESPERA SOLAMENTE DURANTE EL PRE OP Y EL PROCEDIMIENTO.   **NO SE PERMITEN VISITAS EN EL REA DE CORTA ESTADA NI EN LA SALA DE RECUPERACIN!!**  SI VA A SER INGRESADO(A) AL HOSPITAL SLO SE LE PERMITEN CUATRO PERSONAS DE APOYO DURANTE LAS HORAS DE VISITA (7 AM -8PM)   La(s) persona(s) de apoyo debe(n) pasar nuestra evaluacin, entrar y salir con gel y Facilities manager en todo momento, incluso en la habitacin del [place]. Los pacientes tambin deben usar una mscara cuando el personal o su persona de apoyo estn en la habitacin. Los visitantes DEBEN LLEVAR ETIQUETA DE VISITANTE DE UNA MANERA VISIBLE. Un visitante adulto Insurance claims handler con usted durante la noche y DEBE estar en la habitacin a las 8 P.M.     Su procedimiento est programado en: 05/07/23   Presntese a la entrada principal del [facility]     Presntese a admisiones por la maana a las: 7:15 AM.   Llame a este nmero si tiene problemas la maana de la ciruga al [phone]   No consuma alimentos ni beba liquidos : Despus de la medianoche  SIGA CUALQUIER INSTRUCCIN ADICIONAL PREOPERATORIA QUE HAYA RECIBIDO DEL OFICINA DE DU CIRUJANO!!!   La higiene bucal tambin es importante para reducir el riesgo de infeccin.                                   Recuerde - LVESE LOS DIENTES EN LA MAANA DE LA CIRUGA CON SU PASTA DENTAL HABITUAL   NO fume despus de la medianoche   [employer] medicamentos en la maana de la ciruga con UN SORBO DE AGUA: amlodipine,omeprazole.  NO TOME NINGN MEDICAMENTO ORAL PARA LA DIABETES EL DA DE LA CIRUGA                 No debe trae ningn metal en el cuerpo, incluyendo pinzas para el cabello, joyas, ni aretes/pendientes             No use maquillaje, lociones/cremas, polvos, perfumes/colonias o desodorante  No use esmalte de uas, incluyendo los  de gel ni S&S, uas artificiales/acrlicas o cualquier otro tipo de cobertura en las uas naturales, incluyendo las uas de las manos y [employer]. Si tiene uas artificiales, con capas de gel, etc. que necesite que le quiten en un saln de uas, por favor, pida que se lo quiten antes de la ciruga o la ciruga podra ser cancelada/retrasada si el cirujano o el anestesilogo consideran que no puede ser monitoreado(a) de una forma segura.   No se rasure en las 48 horas antes de la operacin.    No traiga objetos de valor al hospital. [place] NO SE HACE RESPONSABLE DE LOS OBJETOS DE VALOR.   Los contactos, las dentaduras o los puentes no se pueden usar durante la [place].   Elsworth Soho una bolsa pequea para la noche el da de la [place].   NO TRAIGA AL HOSPITAL LOS MEDICAMENTOS QUE TOMA EN CASA . LA FARMACIA LE SUMINISTRAR LOS MEDICAMENTOS QUE TENGA EN SU LISTA DE MEDICAMENTOS DURANTE SU ESTADA EN EL HOSPITAL!    Los pacientes dados de alta el mismo da de la ciruga no podrn Company secretary a casa.  Es NECESARIO que alguien se quede con usted durante  las primeras 24 horas despus de la anestesia.   Instrucciones especiales: Dorna Bloom copia de sus documentos de poder notarial y testamento vital el da de su ciruga si no los ha escaneado antes.              Por favor, lea las siguientes hojas informativas que le dieron: SI TIENE PREGUNTAS SOBRE SUS INSTRUCCIONES PREOPERATORIAS POR FAVOR LLAME AL [phone]                        PREPARACIN PARA LA CIRUGA                                            Preparing for Surgery  Debido a que la piel no est esterilizada, sta necesita estar lo ms libre de grmenes como sea posible.  Usted puede reducir el nmero de grmenes en la piel lavndose con el jabn de CHG (Chlorahexidine gluconate) antes de la ciruga.  El CHG es un jabn antisptico el cual mata los grmenes y se une a la piel para continuar matando los grmenes incluso hasta despus de  lavarse. POR FAVOR NO LO USE SI USTED TIENE ALERGIAS AL CHG.  SI LA PIEL SE IRRITA, DEJE DE USAR EL CHG.  NO SE RASURE DURANTE AL MENOS 12 HORAS ANTES DE LA PRIMERA DUCHA CON EL CHG. Siga estas instrucciones cuidadosamente:  Dchese la noche anterior a la [place] y de nuevo en la maana de la [place]. Si decide lavarse el cabello, lvelo con su champ normal primero. Enjuague el cabello y el cuerpo para quitarse el [place]. Use el CHG como lo hara con cualquier otro jabn lquido, usando una toallita o esponja vegetal o exfoliante. Aplique el CHG al cuerpo solamente DEL CUELLO PARA ABAJO.  No lo use cerca de los ojos o los genitales. No se lave con su jabn normal despus de usar el CHG. Squese con una toalla limpia. Espere hasta la maana siguiente para aplicarse desodorantes, lociones, excepto en el da de la [place], NO SE APLIQUE LOCIONES. Use pijamas limpias o una bata. Coloque sbanas limpias en su cama la noche de su primera ducha - no duerma con mascotas. 10.  Use ropa limpia al venir al hospital.

## 2023-04-29 NOTE — Progress Notes (Signed)
COVID Vaccine received:  []  No []  Yes Date of any COVID positive Test in last 90 days:  PCP -  Cardiologist -   Chest x-ray -  EKG -   Stress Test -  ECHO -  Cardiac Cath -   Bowel Prep - []  No  []   Yes ______  Pacemaker / ICD device []  No []  Yes   Spinal Cord Stimulator:[]  No []  Yes       History of Sleep Apnea? []  No []  Yes   CPAP used?- []  No []  Yes    Does the patient monitor blood sugar?          []  No []  Yes  []  N/A  Patient has: []  NO Hx DM   []  Pre-DM                 []  DM1  []   DM2 Does patient have a [employer] or [employer]? []  No []  Yes   Fasting Blood Sugar Ranges-  Checks Blood Sugar _____ times a day  GLP1 agonist / usual dose -  GLP1 instructions:  SGLT-2 inhibitors / usual dose -  SGLT-2 instructions:   Blood Thinner / Instructions: Aspirin Instructions:  Comments:   Activity level: Patient is able / unable to climb a flight of stairs without difficulty; []  No CP  []  No SOB, but would have ___   Patient can / can not perform ADLs without assistance.   Anesthesia review:   Patient denies shortness of breath, fever, cough and chest pain at PAT appointment.  Patient verbalized understanding and agreement to the Pre-Surgical Instructions that were given to them at this PAT appointment. Patient was also educated of the need to review these PAT instructions again prior to his/her surgery.I reviewed the appropriate phone numbers to call if they have any and questions or concerns.

## 2023-04-30 ENCOUNTER — Other Ambulatory Visit: Payer: Self-pay

## 2023-04-30 ENCOUNTER — Encounter (HOSPITAL_COMMUNITY): Payer: Self-pay

## 2023-04-30 ENCOUNTER — Encounter (HOSPITAL_COMMUNITY)
Admission: RE | Admit: 2023-04-30 | Discharge: 2023-04-30 | Disposition: A | Discharge status: Home / Self Care | Payer: Medicare HMO | Source: Ambulatory Visit | Attending: Urology | Admitting: Urology

## 2023-04-30 DIAGNOSIS — E119 Type 2 diabetes mellitus without complications: Secondary | ICD-10-CM | POA: Insufficient documentation

## 2023-04-30 DIAGNOSIS — Z01818 Encounter for other preprocedural examination: Secondary | ICD-10-CM | POA: Insufficient documentation

## 2023-04-30 HISTORY — DX: Sleep apnea, unspecified: G47.30

## 2023-04-30 HISTORY — DX: Unspecified osteoarthritis, unspecified site: M19.90

## 2023-04-30 LAB — HEMOGLOBIN A1C
Hgb A1c MFr Bld: 6.2 % — ABNORMAL HIGH (ref 4.8–5.6)
Mean Plasma Glucose: 131.24 mg/dL

## 2023-04-30 LAB — GLUCOSE, CAPILLARY: Glucose-Capillary: 103 mg/dL — ABNORMAL HIGH (ref 70–99)

## 2023-04-30 NOTE — Progress Notes (Signed)
For Short Stay: COVID SWAB appointment date:  Bowel Prep reminder:   For Anesthesia: PCP - [place], Betty G, MD  Cardiologist - N/A  Chest x-ray -  EKG - 04/30/23 Stress Test -  ECHO -  Cardiac Cath -  Pacemaker/ICD device last checked: Pacemaker orders received: Device Rep notified:  Spinal Cord Stimulator: N/A  Sleep Study - Yes CPAP - Yes  Fasting Blood Sugar - 100's Checks Blood Sugar 2 - 3 times a week Date and result of last Hgb A1c-5.8: 11/24/22  Last dose of GLP1 agonist- N/A GLP1 instructions:   Last dose of SGLT-2 inhibitors- N/A SGLT-2 instructions:   Blood Thinner Instructions:N/A Aspirin Instructions: Last Dose:  Activity level: Can go up a flight of stairs and activities of daily living without stopping and without chest pain and/or shortness of breath   Able to exercise without chest pain and/or shortness of breath   Anesthesia review: Hx: HTN,DIA,OSA(CPAP)  Patient denies shortness of breath, fever, cough and chest pain at PAT appointment   Patient verbalized understanding of instructions that were given to them at the PAT appointment. Patient was also instructed that they will need to review over the PAT instructions again at home before surgery.

## 2023-05-06 ENCOUNTER — Encounter: Payer: Self-pay | Admitting: Family Medicine

## 2023-05-06 ENCOUNTER — Ambulatory Visit: Payer: Medicare HMO | Admitting: Family Medicine

## 2023-05-06 VITALS — BP 133/75 | Ht 60.0 in | Wt 141.0 lb

## 2023-05-06 DIAGNOSIS — Z0000 Encounter for general adult medical examination without abnormal findings: Secondary | ICD-10-CM | POA: Diagnosis not present

## 2023-05-06 NOTE — Patient Instructions (Signed)
I really enjoyed getting to talk with you today! I am available on Tuesdays and Thursdays for virtual visits if you have any questions or concerns, or if I can be of any further assistance.   CHECKLIST FROM ANNUAL WELLNESS VISIT:  -Follow up (please call to schedule if not scheduled after visit):   -yearly for annual wellness visit with primary care office  Here is a list of your preventive care/health maintenance measures and the plan for each if any are due:  PLAN For any measures below that may be due:   Health Maintenance  Topic Date Due   INFLUENZA VACCINE  02/18/2023   COVID-19 Vaccine (4 - 2023-24 season) 03/21/2023   OPHTHALMOLOGY EXAM  06/02/2023   Diabetic kidney evaluation - Urine ACR  07/22/2023   FOOT EXAM  07/22/2023   HEMOGLOBIN A1C  10/29/2023   Diabetic kidney evaluation - eGFR measurement  04/17/2024   Medicare Annual Wellness (AWV)  05/05/2024   DTaP/Tdap/Td (2 - Td or Tdap) 04/02/2026   Pneumonia Vaccine 35+ Years old  Completed   DEXA SCAN  Completed   Hepatitis C Screening  Completed   Zoster Vaccines- Shingrix  Completed   HPV VACCINES  Aged Out   Colonoscopy  Discontinued    -See a dentist at least yearly  -Get your eyes checked and then per your eye specialist's recommendations  -Other issues addressed today:   -I have included below further information regarding a healthy whole foods based diet, physical activity guidelines for adults, stress management and opportunities for social connections. I hope you find this information useful.   -----------------------------------------------------------------------------------------------------------------------------------------------------------------------------------------------------------------------------------------------------------  NUTRITION: -eat real food: lots of colorful vegetables (half the plate) and fruits -5-7 servings of vegetables and fruits per day (fresh or steamed is best), exp. 2  servings of vegetables with lunch and dinner and 2 servings of fruit per day. Berries and greens such as kale and collards are great choices.  -consume on a regular basis: whole grains (make sure first ingredient on label contains the word "whole"), fresh fruits, fish, nuts, seeds, healthy oils (such as olive oil, avocado oil, grape seed oil) -may eat small amounts of dairy and lean meat on occasion, but avoid processed meats such as ham, bacon, lunch meat, etc. -drink water -try to avoid fast food and pre-packaged foods, processed meat -most experts advise limiting sodium to < 2300mg  per day, should limit further is any chronic conditions such as high blood pressure, heart disease, diabetes, etc. The American Heart Association advised that < 1500mg  is is ideal -try to avoid foods that contain any ingredients with names you do not recognize  -try to avoid sugar/sweets (except for the natural sugar that occurs in fresh fruit) -try to avoid sweet drinks -try to avoid white rice, white bread, pasta (unless whole grain), white or yellow potatoes  EXERCISE GUIDELINES FOR ADULTS: -if you wish to increase your physical activity, do so gradually and with the approval of your doctor -STOP and seek medical care immediately if you have any chest pain, chest discomfort or trouble breathing when starting or increasing exercise  -move and stretch your body, legs, feet and arms when sitting for long periods -Physical activity guidelines for optimal health in adults: -least 150 minutes per week of aerobic exercise (can talk, but not sing) once approved by your doctor, 20-30 minutes of sustained activity or two 10 minute episodes of sustained activity every day.  -resistance training at least 2 days per week if approved by your  doctor -balance exercises 3+ days per week:   Stand somewhere where you have something sturdy to hold onto if you lose balance.    1) lift up on toes, start with 5x per day and work up to  20x   2) stand and lift on leg straight out to the side so that foot is a few inches of the floor, start with 5x each side and work up to 20x each side   3) stand on one foot, start with 5 seconds each side and work up to 20 seconds on each side  If you need ideas or help with getting more active:  -Silver sneakers https://tools.silversneakers.com  -Walk with a Doc: http://www.duncan-[email or web address]/  -try to include resistance (weight lifting/strength building) and balance exercises twice per week: or the following link for ideas: http://castillo-powell.com/  BuyDucts.dk  STRESS MANAGEMENT: -can try meditating, or just sitting quietly with deep breathing while intentionally relaxing all parts of your body for 5 minutes daily -if you need further help with stress, anxiety or depression please follow up with your primary doctor or contact the wonderful folks at [facility]: [phone]  SOCIAL CONNECTIONS: -options in [place] if you wish to engage in more social and exercise related activities:  -Silver sneakers https://tools.silversneakers.com  -Walk with a Doc: http://www.duncan-[email or web address]  -Check out the [facility] Active Adults 50+ section on the [place] [employer] (hiking clubs, book clubs, cards and games, chess, exercise classes, aquatic classes and much more) - see the website for details: https://www.[place].gov/departments/parks-recreation/active-adults50  -YouTube has lots of exercise videos for different ages and abilities as well  -[facility] (a variety of indoor and outdoor inperson activities for adults). [phone]. [address]  -Virtual Online Classes (a variety of topics): see seniorplanet.org or call [phone]  -consider volunteering at a school, hospice center, church, senior center or elsewhere   ADVANCED HEALTHCARE  DIRECTIVES:  [place] Advanced Directives assistance:   ExpressWeek.com.cy   [email or web address].fr  Everyone should have advanced health care directives in place. This is so that you get the care you want, should you ever be in a situation where you are unable to make your own medical decisions.   From the [place] Advanced Directive Website: "Advance Health Care Directives are legal documents in which you give written instructions about your health care if, in the future, you cannot speak for yourself.   A health care power of attorney allows you to name a person you trust to make your health care decisions if you cannot make them yourself. A declaration of a desire for a natural death (or living will) is document, which states that you desire not to have your life prolonged by extraordinary measures if you have a terminal or incurable illness or if you are in a vegetative state. An advance instruction for mental health treatment makes a declaration of instructions, information and preferences regarding your mental health treatment. It also states that you are aware that the advance instruction authorizes a mental health treatment provider to act according to your wishes. It may also outline your consent or refusal of mental health treatment. A declaration of an anatomical gift allows anyone over the age of 74 to make a gift by will, organ donor card or other document."   Please see the following website or an elder law attorney for forms, FAQs and for completion of advanced directives: [place] TEFL teacher Health Care Directives Advance Health Care Directives (http://guzman.com/)  Or copy and paste the  following to your web browser: PoshChat.fi

## 2023-05-06 NOTE — Progress Notes (Signed)
PATIENT CHECK-IN and HEALTH RISK ASSESSMENT QUESTIONNAIRE:  -completed by phone/video for upcoming Medicare Preventive Visit  Pre-Visit Check-in: 1)Vitals (height, wt, BP, etc) - record in vitals section for visit on day of visit Request home vitals (wt, BP, etc.) and enter into vitals, THEN update Vital Signs SmartPhrase below at the top of the HPI. See below.  2)Review and Update Medications, Allergies PMH, Surgeries, Social history in Epic 3)Hospitalizations in the last year with date/reason? April 18, 2023 for kidney stone . 4)Review and Update Care Team (patient's specialists) in Epic 5) Complete PHQ9 in Epic  6) Complete Fall Screening in Epic 7)Review all Health Maintenance Due and order under PCP if not done.  8)Medicare Wellness Questionnaire: Answer theses question about your habits: Do you drink alcohol? No If yes, how many drinks do you have a day?n/a Have you ever smoked?No Quit date if applicable? N/a  How many packs a day do/did you smoke? N/a Do you use smokeless tobacco?n/a Do you use an illicit drugs?n/a Do you exercises? Yes IF so, what type and how many days/minutes per week?walk every day for 1 hr Are you sexually active? No, live alone Number of partners? Typical breakfast: green juice-cucumber, apple, spinach, egg, milk, cheese.  Typical lunch:protein-alternate salmon, beef, chicken, vegetatable, legumes Typical dinner: similar to lunch but lighter with salad Typical snacks:no  Beverages: water, little coffee in the morning   Answer theses question about you: Can you perform most household chores?no Do you find it hard to follow a conversation in a noisy room?no Do you often ask people to speak up or repeat themselves? No, has hearing aid  Do you feel that you have a problem with memory? no Do you balance your checkbook and or bank acounts?yes Do you feel safe at home?yes Last dentist visit?it was 2 months ago, Dental Work in [place]. Pt brought up  about she got implant with [facility], that it covers. Would like to discuss with provider.  Do you need assistance with any of the following: No Please note if so   Driving?  Feeding yourself?  Getting from bed to chair?  Getting to the toilet?  Bathing or showering?  Dressing yourself?  Managing money?  Climbing a flight of stairs  Preparing meals?  Do you have Advanced Directives in place (Living Will, Healthcare Power or Attorney)? No   Last eye Exam and location?it was in March of this year at [facility] in [place]   Do you currently use prescribed or non-prescribed narcotic or opioid pain medications?Yes  Do you have a history or close family history of breast, ovarian, tubal or peritoneal cancer or a family member with BRCA (breast cancer susceptibility 1 and 2) gene mutations? No  Request home vitals (wt, BP, etc.) and enter into vitals, THEN update Vital Signs SmartPhrase below at the top of the HPI. See below.   Nurse/Assistant Credentials/time stamp:Karpuih Moyun/CMA/5:25pm   ----------------------------------------------------------------------------------------------------------------------------------------------------------------------------------------------------------------------  Because this visit was a virtual/telehealth visit, some criteria may be missing or patient reported. Any vitals not documented were not able to be obtained and vitals that have been documented are patient reported.    MEDICARE ANNUAL PREVENTIVE VISIT WITH PROVIDER: (Welcome to Medicare, initial annual wellness or annual wellness exam)  Virtual Visit via Phone Note  I connected with Jodi [name] on 05/06/23 by phone and verified that I am speaking with the correct person using two identifiers.  Location patient: home Location provider:work or home office Persons participating in  the virtual visit: patient, provider, interpreter  Concerns  and/or follow up today: currently dealing with kidney stones, having lithotripsy tomorrow   See HM section in Epic for other details of completed HM.    ROS: negative for report of fevers, unintentional weight loss, vision changes, vision loss, hearing loss or change, chest pain, sob, hemoptysis, melena, hematochezia, hematuria, falls, bleeding or bruising, thoughts of suicide or self harm, memory loss  Patient-completed extensive health risk assessment - reviewed and discussed with the patient: See Health Risk Assessment completed with patient prior to the visit either above or in recent phone note. This was reviewed in detailed with the patient today and appropriate recommendations, orders and referrals were placed as needed per Summary below and patient instructions.   Review of Medical History: -PMH, PSH, Family History and current specialty and care providers reviewed and updated and listed below   Patient Care Team: [place], Betty G, MD as PCP - General (Family Medicine)   Past Medical History:  Diagnosis Date   Anemia    Arthritis    Diabetes mellitus without complication (HCC)    History of kidney stones    Hypertension    Insomnia    Polio    in childhood   Sleep apnea     Past Surgical History:  Procedure Laterality Date   CESAREAN SECTION     SHOULDER SURGERY Left 2010    Social History   Socioeconomic History   Marital status: Divorced    Spouse name: Not on file   Number of children: Not on file   Years of education: Not on file   Highest education level: Not on file  Occupational History   Not on file  Tobacco Use   Smoking status: Never   Smokeless tobacco: Never  Vaping Use   Vaping status: Never Used  Substance and Sexual Activity   Alcohol use: Not Currently   Drug use: Never   Sexual activity: Not Currently  Other Topics Concern   Not on file  Social History Narrative   ** Merged History Encounter **    Caffiene; 1-cup coffee daily    Working: no retired   Programme researcher, broadcasting/film/video; Human resources officer.    Social Determinants of Health   Financial Resource Strain: Low Risk  (04/16/2022)   Overall Financial Resource Strain (CARDIA)    Difficulty of Paying Living Expenses: Not hard at all  Food Insecurity: No Food Insecurity (04/16/2022)   Hunger Vital Sign    Worried About Running Out of Food in the Last Year: Never true    Ran Out of Food in the Last Year: Never true  Transportation Needs: No Transportation Needs (04/16/2022)   PRAPARE - Administrator, Civil Service (Medical): No    Lack of Transportation (Non-Medical): No  Physical Activity: Sufficiently Active (04/16/2022)   Exercise Vital Sign    Days of Exercise per Week: 5 days    Minutes of Exercise per Session: 60 min  Stress: No Stress Concern Present (04/16/2022)   [employer] of Occupational Health - Occupational Stress Questionnaire    Feeling of Stress : Not at all  Social Connections: Moderately Integrated (04/16/2022)   Social Connection and Isolation Panel [NHANES]    Frequency of Communication with Friends and Family: More than three times a week    Frequency of Social Gatherings with Friends and Family: More than three times a week    Attends Religious Services: More than 4 times per year  Active Member of Clubs or Organizations: Yes    Attends Banker Meetings: More than 4 times per year    Marital Status: Divorced  Intimate Partner Violence: Not At Risk (04/16/2022)   Humiliation, Afraid, Rape, and Kick questionnaire    Fear of Current or Ex-Partner: No    Emotionally Abused: No    Physically Abused: No    Sexually Abused: No    Family History  Family history unknown: Yes    Current Outpatient Medications on File Prior to Visit  Medication Sig Dispense Refill   ACCU-CHEK SOFTCLIX LANCETS lancets Use as instructed to test blood glucose twice daily. 100 each 12   amLODipine (NORVASC) 5 MG tablet TAKE 1/2 TABLET POR VIA ORAL A DIARIO  (Patient taking differently: Take 2.5 mg by mouth daily.) 45 tablet 3   CALCIUM PO Take 1 tablet by mouth daily.     cephALEXin (KEFLEX) 500 MG capsule Take 1 capsule (500 mg total) by mouth 3 (three) times daily. 21 capsule 0   cholecalciferol (VITAMIN D3) 25 MCG (1000 UNIT) tablet Take 1,000 Units by mouth daily.     cyanocobalamin (VITAMIN B12) 1000 MCG tablet Take 1,000 mcg by mouth daily.     diclofenac Sodium (VOLTAREN) 1 % GEL APPLY 4 G TOPICALLY 4 (FOUR) TIMES DAILY. LEFT KNEE. (Patient taking differently: Apply 4 g topically 4 (four) times daily as needed (Left knee pain).) 100 g 0   ferrous sulfate 325 (65 FE) MG tablet Take 1 tablet (325 mg total) by mouth daily with breakfast. 90 tablet 1   hydrochlorothiazide (HYDRODIURIL) 12.5 MG tablet TOME UNA TABLETA TODOS LOS DIAS (Patient taking differently: Take 12.5 mg by mouth daily.) 90 tablet 3   meloxicam (MOBIC) 7.5 MG tablet TOME UNA TABLETA POR VIA ORAL A DIARIO CUANDO SEA NECESARIO PARA EL DOLOR (Patient taking differently: Take 7.5 mg by mouth daily.) 30 tablet 1   Omega-3 Fatty Acids (FISH OIL) 1000 MG CAPS Take 1,000 mg by mouth daily.     omeprazole (PRILOSEC) 40 MG capsule TOME UNA CAPSULA TODOS LOS DIAS (Patient taking differently: Take 40 mg by mouth daily. TOME UNA CAPSULA TODOS LOS DIAS) 90 capsule 2   ondansetron (ZOFRAN-ODT) 4 MG disintegrating tablet Take 1 tablet (4 mg total) by mouth every 8 (eight) hours as needed. 20 tablet 0   oxyCODONE-acetaminophen (PERCOCET/ROXICET) 5-325 MG tablet Take 1 tablet by mouth every 6 (six) hours as needed for severe pain. 10 tablet 0   tamsulosin (FLOMAX) 0.4 MG CAPS capsule Take 1 capsule (0.4 mg total) by mouth daily. 30 capsule 0   triamcinolone cream (KENALOG) 0.1 % APLIQUE AL AREA AFECTADA DOS VECES AL DIA (Patient taking differently: Apply 1 Application topically daily as needed (irritation).) 30 g 1   Vibegron 75 MG TABS Take 1 tablet by mouth daily. 30 tablet 5   vitamin E 180 MG  (400 UNITS) capsule Take 400 Units by mouth daily.     alendronate (FOSAMAX) 70 MG tablet TAKE 1 TABLET (70 MG TOTAL) BY MOUTH ONCE A WEEK. TAKE 1 TABLET (70 MG TOTAL) BY MOUTH ONCE A WEEK. (Patient not taking: Reported on 05/06/2023) 12 tablet 4   atorvastatin (LIPITOR) 10 MG tablet TOME UNA TABLETA TODOS LOS DIAS (Patient not taking: Reported on 04/28/2023) 90 tablet 2   No current facility-administered medications on file prior to visit.    Allergies  Allergen Reactions   Pork-Derived Products     Rash, joint pain   Shellfish  Allergy Rash and Other (See Comments)    joint pain, GI pain       Physical Exam Vitals requested from patient and listed below if patient had equipment and was able to obtain at home for this virtual visit: Vitals:   05/06/23 1658  BP: 133/75   Estimated body mass index is 27.54 kg/m as calculated from the following:   Height as of this encounter: 5' (1.524 m).   Weight as of this encounter: 141 lb (64 kg).  EKG (optional): deferred due to virtual visit  GENERAL: alert, oriented, no acute distress detected, full vision exam deferred due to pandemic and/or virtual encounter  PSYCH/NEURO: pleasant and cooperative, no obvious depression or anxiety, speech and thought processing grossly intact, Cognitive function grossly intact  Flowsheet Row Office Visit from 05/06/2023 in [facility] [place]  PHQ-9 Total Score 14           05/06/2023    5:00 PM 07/21/2022    9:03 AM 04/16/2022    3:35 PM 04/16/2022    3:34 PM 01/16/2022    8:56 AM  Depression screen PHQ 2/9  Decreased Interest 2 2 0 0 2  Down, Depressed, Hopeless 2 3 0 0 1  PHQ - 2 Score 4 5 0 0 3  Altered sleeping 2 3   3   Tired, decreased energy 3 3   2   Change in appetite 3 0   0  Feeling bad or failure about yourself  2 0   0  Trouble concentrating 0 2   2  Moving slowly or fidgety/restless 0 0   2  Suicidal thoughts 0 0   0  PHQ-9 Score 14 13   12   Difficult  doing work/chores Not difficult at all Somewhat difficult   Somewhat difficult  She feels is related to the kidney stones. Does not feel needs treatment currently.      07/25/2021   11:19 AM 01/16/2022    8:56 AM 04/16/2022    3:39 PM 07/21/2022    9:03 AM 05/06/2023    5:07 PM  Fall Risk  Falls in the past year?  1 1 1  0  Was there an injury with Fall?  0 0 0 0  Fall Risk Category Calculator  2 2 1  0  Fall Risk Category (Retired)  Moderate Moderate Low   (RETIRED) Patient Fall Risk Level Low fall risk Moderate fall risk Low fall risk Low fall risk   Patient at Risk for Falls Due to  History of fall(s) No Fall Risks History of fall(s) No Fall Risks  Fall risk Follow up  Falls evaluation completed Falls prevention discussed Falls evaluation completed Falls evaluation completed     SUMMARY AND PLAN:  Encounter for Medicare annual wellness exam   Discussed applicable health maintenance/preventive health measures and advised and referred or ordered per patient preferences: -she plans to get flu and covid vaccines as soon as through with the kidney stone issues, advised to let us know if does at the pharmacy so that we can update her record  Health Maintenance  Topic Date Due   INFLUENZA VACCINE  02/18/2023   COVID-19 Vaccine (4 - 2023-24 season) 03/21/2023   OPHTHALMOLOGY EXAM  06/02/2023   Diabetic kidney evaluation - Urine ACR  07/22/2023   FOOT EXAM  07/22/2023   HEMOGLOBIN A1C  10/29/2023   Diabetic kidney evaluation - eGFR measurement  04/17/2024   Medicare Annual Wellness (AWV)  05/05/2024   DTaP/Tdap/Td (  2 - Td or Tdap) 04/02/2026   Pneumonia Vaccine 65+ Years old  Completed   DEXA SCAN  Completed   Hepatitis C Screening  Completed   Zoster Vaccines- Shingrix  Completed   HPV VACCINES  Aged Out   Colonoscopy  Discontinued      Education and counseling on the following was provided based on the above review of health and a plan/checklist for the patient, along with  additional information discussed, was provided for the patient in the patient instructions :  -Advised on importance of completing advanced directives, discussed options for completing and provided information in patient instructions as well - included links in pt instructions for assistance and she says children can translate and help with the printed info in english and the website -Provided  safe balance exercises that can be done at home to improve balance and discussed exercise guidelines for adults with include balance exercises at least 3 days per week.  -Advised and counseled on a healthy lifestyle - including the importance of a healthy diet, regular physical activity, social connections and stress management. -Reviewed patient's current diet. Advised and counseled on a whole foods based healthy diet. A summary of a healthy diet was provided in the Patient Instructions. Congratulated on her healthy choices. -reviewed patient's current physical activity level and discussed exercise guidelines for adults. She was very active before recent illness and plans to get back to the exercise as soon as is able. Congratulated on her efforts.  -Advise yearly dental visits at minimum and regular eye exams   Follow up: see patient instructions     Patient Instructions  I really enjoyed getting to talk with you today! I am available on Tuesdays and Thursdays for virtual visits if you have any questions or concerns, or if I can be of any further assistance.   CHECKLIST FROM ANNUAL WELLNESS VISIT:  -Follow up (please call to schedule if not scheduled after visit):   -yearly for annual wellness visit with primary care office  Here is a list of your preventive care/health maintenance measures and the plan for each if any are due:  PLAN For any measures below that may be due:   Health Maintenance  Topic Date Due   INFLUENZA VACCINE  02/18/2023   COVID-19 Vaccine (4 - 2023-24 season) 03/21/2023    OPHTHALMOLOGY EXAM  06/02/2023   Diabetic kidney evaluation - Urine ACR  07/22/2023   FOOT EXAM  07/22/2023   HEMOGLOBIN A1C  10/29/2023   Diabetic kidney evaluation - eGFR measurement  04/17/2024   Medicare Annual Wellness (AWV)  05/05/2024   DTaP/Tdap/Td (2 - Td or Tdap) 04/02/2026   Pneumonia Vaccine 24+ Years old  Completed   DEXA SCAN  Completed   Hepatitis C Screening  Completed   Zoster Vaccines- Shingrix  Completed   HPV VACCINES  Aged Out   Colonoscopy  Discontinued    -See a dentist at least yearly  -Get your eyes checked and then per your eye specialist's recommendations  -Other issues addressed today:   -I have included below further information regarding a healthy whole foods based diet, physical activity guidelines for adults, stress management and opportunities for social connections. I hope you find this information useful.   -----------------------------------------------------------------------------------------------------------------------------------------------------------------------------------------------------------------------------------------------------------  NUTRITION: -eat real food: lots of colorful vegetables (half the plate) and fruits -5-7 servings of vegetables and fruits per day (fresh or steamed is best), exp. 2 servings of vegetables with lunch and dinner and 2 servings of fruit per  day. Berries and greens such as kale and collards are great choices.  -consume on a regular basis: whole grains (make sure first ingredient on label contains the word "whole"), fresh fruits, fish, nuts, seeds, healthy oils (such as olive oil, avocado oil, grape seed oil) -may eat small amounts of dairy and lean meat on occasion, but avoid processed meats such as ham, bacon, lunch meat, etc. -drink water -try to avoid fast food and pre-packaged foods, processed meat -most experts advise limiting sodium to < 2300mg  per day, should limit further is any chronic  conditions such as high blood pressure, heart disease, diabetes, etc. The American Heart Association advised that < 1500mg  is is ideal -try to avoid foods that contain any ingredients with names you do not recognize  -try to avoid sugar/sweets (except for the natural sugar that occurs in fresh fruit) -try to avoid sweet drinks -try to avoid white rice, white bread, pasta (unless whole grain), white or yellow potatoes  EXERCISE GUIDELINES FOR ADULTS: -if you wish to increase your physical activity, do so gradually and with the approval of your doctor -STOP and seek medical care immediately if you have any chest pain, chest discomfort or trouble breathing when starting or increasing exercise  -move and stretch your body, legs, feet and arms when sitting for long periods -Physical activity guidelines for optimal health in adults: -least 150 minutes per week of aerobic exercise (can talk, but not sing) once approved by your doctor, 20-30 minutes of sustained activity or two 10 minute episodes of sustained activity every day.  -resistance training at least 2 days per week if approved by your doctor -balance exercises 3+ days per week:   Stand somewhere where you have something sturdy to hold onto if you lose balance.    1) lift up on toes, start with 5x per day and work up to 20x   2) stand and lift on leg straight out to the side so that foot is a few inches of the floor, start with 5x each side and work up to 20x each side   3) stand on one foot, start with 5 seconds each side and work up to 20 seconds on each side  If you need ideas or help with getting more active:  -Silver sneakers https://tools.silversneakers.com  -Walk with a Doc: http://www.duncan-williams.com/  -try to include resistance (weight lifting/strength building) and balance exercises twice per week: or the following link for  ideas: http://castillo-powell.com/  BuyDucts.dk  STRESS MANAGEMENT: -can try meditating, or just sitting quietly with deep breathing while intentionally relaxing all parts of your body for 5 minutes daily -if you need further help with stress, anxiety or depression please follow up with your primary doctor or contact the wonderful folks at [facility]: [phone]  SOCIAL CONNECTIONS: -options in [place] if you wish to engage in more social and exercise related activities:  -Silver sneakers https://tools.silversneakers.com  -Walk with a Doc: http://www.duncan-williams.com/  -Check out the [facility] Active Adults 50+ section on the [place] of [employer] (hiking clubs, book clubs, cards and games, chess, exercise classes, aquatic classes and much more) - see the website for details: https://www.[place].gov/departments/parks-recreation/active-adults50  -YouTube has lots of exercise videos for different ages and abilities as well  -[facility] (a variety of indoor and outdoor inperson activities for adults). ([phone]. [address].  -Virtual Online Classes (a variety of topics): see seniorplanet.org or call [phone]  -consider volunteering at a school, hospice center, church, senior center or elsewhere  ADVANCED HEALTHCARE DIRECTIVES:  [place] Advanced Directives assistance:   ExpressWeek.com.cy   RankHunter.fr  Everyone should have advanced health care directives in place. This is so that you get the care you want, should you ever be in a situation where you are unable to make your own medical decisions.   From the [place] Advanced Directive Website: "Advance Health Care Directives are legal documents in which you give  written instructions about your health care if, in the future, you cannot speak for yourself.   A health care power of attorney allows you to name a person you trust to make your health care decisions if you cannot make them yourself. A declaration of a desire for a natural death (or living will) is document, which states that you desire not to have your life prolonged by extraordinary measures if you have a terminal or incurable illness or if you are in a vegetative state. An advance instruction for mental health treatment makes a declaration of instructions, information and preferences regarding your mental health treatment. It also states that you are aware that the advance instruction authorizes a mental health treatment provider to act according to your wishes. It may also outline your consent or refusal of mental health treatment. A declaration of an anatomical gift allows anyone over the age of 70 to make a gift by will, organ donor card or other document."   Please see the following website or an elder law attorney for forms, FAQs and for completion of advanced directives: [place] TEFL teacher Health Care Directives Advance Health Care Directives (http://guzman.[email or web address]/)  Or copy and paste the following to your web browser: [email or web address]          Terressa Koyanagi, DO

## 2023-05-07 ENCOUNTER — Ambulatory Visit (HOSPITAL_COMMUNITY)
Admission: RE | Admit: 2023-05-07 | Discharge: 2023-05-07 | Disposition: A | Discharge status: Home / Self Care | Payer: Medicare HMO | Source: Ambulatory Visit | Attending: Urology | Admitting: Urology

## 2023-05-07 ENCOUNTER — Ambulatory Visit (HOSPITAL_BASED_OUTPATIENT_CLINIC_OR_DEPARTMENT_OTHER): Payer: Medicare HMO | Admitting: Anesthesiology

## 2023-05-07 ENCOUNTER — Encounter (HOSPITAL_COMMUNITY): Payer: Self-pay | Admitting: Urology

## 2023-05-07 ENCOUNTER — Ambulatory Visit (HOSPITAL_COMMUNITY): Payer: Medicare HMO

## 2023-05-07 ENCOUNTER — Other Ambulatory Visit: Payer: Self-pay

## 2023-05-07 ENCOUNTER — Encounter (HOSPITAL_COMMUNITY)
Admission: RE | Disposition: A | Discharge status: Home / Self Care | Payer: Self-pay | Source: Ambulatory Visit | Attending: Urology

## 2023-05-07 ENCOUNTER — Ambulatory Visit (HOSPITAL_COMMUNITY): Payer: Medicare HMO | Admitting: Anesthesiology

## 2023-05-07 DIAGNOSIS — I10 Essential (primary) hypertension: Secondary | ICD-10-CM | POA: Insufficient documentation

## 2023-05-07 DIAGNOSIS — N201 Calculus of ureter: Secondary | ICD-10-CM

## 2023-05-07 DIAGNOSIS — Z6827 Body mass index (BMI) 27.0-27.9, adult: Secondary | ICD-10-CM | POA: Insufficient documentation

## 2023-05-07 DIAGNOSIS — K219 Gastro-esophageal reflux disease without esophagitis: Secondary | ICD-10-CM | POA: Diagnosis not present

## 2023-05-07 DIAGNOSIS — G4730 Sleep apnea, unspecified: Secondary | ICD-10-CM | POA: Diagnosis not present

## 2023-05-07 DIAGNOSIS — M1990 Unspecified osteoarthritis, unspecified site: Secondary | ICD-10-CM | POA: Diagnosis not present

## 2023-05-07 DIAGNOSIS — Z87442 Personal history of urinary calculi: Secondary | ICD-10-CM | POA: Insufficient documentation

## 2023-05-07 DIAGNOSIS — E669 Obesity, unspecified: Secondary | ICD-10-CM | POA: Diagnosis not present

## 2023-05-07 DIAGNOSIS — E119 Type 2 diabetes mellitus without complications: Secondary | ICD-10-CM | POA: Insufficient documentation

## 2023-05-07 DIAGNOSIS — N132 Hydronephrosis with renal and ureteral calculous obstruction: Secondary | ICD-10-CM | POA: Insufficient documentation

## 2023-05-07 HISTORY — PX: CYSTOSCOPY/URETEROSCOPY/HOLMIUM LASER/STENT PLACEMENT: SHX6546

## 2023-05-07 SURGERY — CYSTOSCOPY/URETEROSCOPY/HOLMIUM LASER/STENT PLACEMENT
Anesthesia: General | Site: Ureter | Laterality: Left

## 2023-05-07 MED ORDER — OXYCODONE-ACETAMINOPHEN 5-325 MG PO TABS: 1.0000 | ORAL_TABLET | Freq: Four times a day (QID) | ORAL | 0 refills | Status: DC | PRN

## 2023-05-07 MED ORDER — PROPOFOL 10 MG/ML IV BOLUS
INTRAVENOUS | Status: AC
  Filled 2023-05-07: qty 20

## 2023-05-07 MED ORDER — KETOROLAC TROMETHAMINE 10 MG PO TABS: 10.0000 mg | ORAL_TABLET | Freq: Four times a day (QID) | ORAL | 0 refills | Status: DC | PRN

## 2023-05-07 MED ORDER — FENTANYL CITRATE (PF) 100 MCG/2ML IJ SOLN
INTRAMUSCULAR | Status: DC | PRN
  Administered 2023-05-07 (×2): 25 ug via INTRAVENOUS

## 2023-05-07 MED ORDER — HYDROMORPHONE HCL 1 MG/ML IJ SOLN: 0.2500 mg | INTRAMUSCULAR | Status: DC | PRN

## 2023-05-07 MED ORDER — FENTANYL CITRATE (PF) 100 MCG/2ML IJ SOLN
INTRAMUSCULAR | Status: AC
  Filled 2023-05-07: qty 2

## 2023-05-07 MED ORDER — ONDANSETRON HCL 4 MG/2ML IJ SOLN
INTRAMUSCULAR | Status: AC
  Filled 2023-05-07: qty 2

## 2023-05-07 MED ORDER — AMISULPRIDE (ANTIEMETIC) 5 MG/2ML IV SOLN: 10.0000 mg | Freq: Once | INTRAVENOUS | Status: DC | PRN

## 2023-05-07 MED ORDER — DEXAMETHASONE SODIUM PHOSPHATE 10 MG/ML IJ SOLN
INTRAMUSCULAR | Status: AC
  Filled 2023-05-07: qty 1

## 2023-05-07 MED ORDER — CEPHALEXIN 500 MG PO CAPS: 500.0000 mg | ORAL_CAPSULE | Freq: Two times a day (BID) | ORAL | 0 refills | Status: DC

## 2023-05-07 MED ORDER — OXYCODONE HCL 5 MG/5ML PO SOLN: 5.0000 mg | Freq: Once | ORAL | Status: DC | PRN

## 2023-05-07 MED ORDER — DEXAMETHASONE SODIUM PHOSPHATE 4 MG/ML IJ SOLN
INTRAMUSCULAR | Status: DC | PRN
Start: 2023-05-07 — End: 2023-05-07
  Administered 2023-05-07: 5 mg via INTRAVENOUS

## 2023-05-07 MED ORDER — SODIUM CHLORIDE 0.9 % IR SOLN
Status: DC | PRN
  Administered 2023-05-07: 3000 mL via INTRAVESICAL

## 2023-05-07 MED ORDER — ONDANSETRON HCL 4 MG/2ML IJ SOLN: 4.0000 mg | Freq: Once | INTRAMUSCULAR | Status: DC | PRN

## 2023-05-07 MED ORDER — PROPOFOL 10 MG/ML IV BOLUS
INTRAVENOUS | Status: DC | PRN
  Administered 2023-05-07: 150 mg via INTRAVENOUS

## 2023-05-07 MED ORDER — EPHEDRINE SULFATE (PRESSORS) 50 MG/ML IJ SOLN
INTRAMUSCULAR | Status: DC | PRN
Start: 2023-05-07 — End: 2023-05-07
  Administered 2023-05-07 (×2): 10 mg via INTRAVENOUS

## 2023-05-07 MED ORDER — LACTATED RINGERS IV SOLN: INTRAVENOUS | Status: DC | PRN

## 2023-05-07 MED ORDER — ACETAMINOPHEN 500 MG PO TABS
1000.0000 mg | ORAL_TABLET | Freq: Once | ORAL | Status: AC
  Administered 2023-05-07: 1000 mg via ORAL
  Filled 2023-05-07: qty 2

## 2023-05-07 MED ORDER — ONDANSETRON HCL 4 MG/2ML IJ SOLN
INTRAMUSCULAR | Status: DC | PRN
  Administered 2023-05-07: 4 mg via INTRAVENOUS

## 2023-05-07 MED ORDER — IOHEXOL 300 MG/ML  SOLN
INTRAMUSCULAR | Status: DC | PRN
  Administered 2023-05-07: 12 mL

## 2023-05-07 MED ORDER — PHENYLEPHRINE 80 MCG/ML (10ML) SYRINGE FOR IV PUSH (FOR BLOOD PRESSURE SUPPORT)
PREFILLED_SYRINGE | INTRAVENOUS | Status: DC | PRN
Start: 2023-05-07 — End: 2023-05-07
  Administered 2023-05-07: 160 ug via INTRAVENOUS

## 2023-05-07 MED ORDER — CHLORHEXIDINE GLUCONATE 0.12 % MT SOLN
15.0000 mL | Freq: Once | OROMUCOSAL | Status: AC
  Administered 2023-05-07: 15 mL via OROMUCOSAL

## 2023-05-07 MED ORDER — LACTATED RINGERS IV SOLN: INTRAVENOUS | Status: DC

## 2023-05-07 MED ORDER — GENTAMICIN SULFATE 40 MG/ML IJ SOLN
5.0000 mg/kg | INTRAVENOUS | Status: AC
  Administered 2023-05-07: 330 mg via INTRAVENOUS
  Filled 2023-05-07: qty 8.25

## 2023-05-07 MED ORDER — LIDOCAINE HCL (CARDIAC) PF 100 MG/5ML IV SOSY
PREFILLED_SYRINGE | INTRAVENOUS | Status: DC | PRN
  Administered 2023-05-07: 60 mg via INTRAVENOUS

## 2023-05-07 MED ORDER — ORAL CARE MOUTH RINSE: 15.0000 mL | Freq: Once | OROMUCOSAL | Status: AC

## 2023-05-07 MED ORDER — OXYCODONE HCL 5 MG PO TABS: 5.0000 mg | ORAL_TABLET | Freq: Once | ORAL | Status: DC | PRN

## 2023-05-07 SURGICAL SUPPLY — 26 items
BAG URO CATCHER STRL LF (MISCELLANEOUS) ×2 IMPLANT
BASKET LASER NITINOL 1.9FR (BASKET) IMPLANT
BASKET ZERO TIP NITINOL 2.4FR (BASKET) IMPLANT
BSKT STON RTRVL 120 1.9FR (BASKET) ×1
BSKT STON RTRVL ZERO TP 2.4FR (BASKET) ×1
CATH URETL OPEN END 6FR 70 (CATHETERS) ×2 IMPLANT
CLOTH BEACON ORANGE TIMEOUT ST (SAFETY) ×2 IMPLANT
EXTRACTOR STONE 1.7FRX115CM (UROLOGICAL SUPPLIES) IMPLANT
GLOVE SURG LX STRL 7.5 STRW (GLOVE) ×2 IMPLANT
GOWN STRL REUS W/ TWL XL LVL3 (GOWN DISPOSABLE) ×2 IMPLANT
GOWN STRL REUS W/TWL XL LVL3 (GOWN DISPOSABLE) ×1
GUIDEWIRE ANG ZIPWIRE 038X150 (WIRE) ×2 IMPLANT
GUIDEWIRE STR DUAL SENSOR (WIRE) ×2 IMPLANT
KIT TURNOVER KIT A (KITS) IMPLANT
LASER FIB FLEXIVA PULSE ID 365 (Laser) IMPLANT
MANIFOLD NEPTUNE II (INSTRUMENTS) ×2 IMPLANT
PACK CYSTO (CUSTOM PROCEDURE TRAY) ×2 IMPLANT
PAD PREP 24X48 CUFFED NSTRL (MISCELLANEOUS) ×2 IMPLANT
SHEATH NAVIGATOR HD 11/13X28 (SHEATH) IMPLANT
SHEATH NAVIGATOR HD 11/13X36 (SHEATH) IMPLANT
STENT POLARIS 5FRX24 (STENTS) IMPLANT
TRACTIP FLEXIVA PULS ID 200XHI (Laser) IMPLANT
TRACTIP FLEXIVA PULSE ID 200 (Laser) ×1
TUBE PU 8FR 16IN ENFIT (TUBING) ×2 IMPLANT
TUBING CONNECTING 10 (TUBING) ×2 IMPLANT
TUBING UROLOGY SET (TUBING) ×2 IMPLANT

## 2023-05-07 NOTE — Brief Op Note (Signed)
05/07/2023  10:32 AM  PATIENT:  Jodi [name]  77 y.o. female  PRE-OPERATIVE DIAGNOSIS:  LEFT URETERAL CALCULUS  POST-OPERATIVE DIAGNOSIS:  LEFT URETERAL CALCULUS  PROCEDURE:  Procedure(s) with comments: CYSTOSCOPY, LEFT RETROGRADE PYELOGRAM, LEFT URETEROSCOPY, HOLMIUM LASER LITHOTRIPSY, LEFT URETERAL STENT PLACEMENT (Left) - 60 MINUTES  SURGEON:  Surgeons and Role:    * Allin Frix, Delbert Phenix., MD - Primary  PHYSICIAN ASSISTANT:   ASSISTANTS: none   ANESTHESIA:   general  EBL:  minimal   BLOOD ADMINISTERED:none  DRAINS: none   LOCAL MEDICATIONS USED:  NONE  SPECIMEN:  Source of Specimen:  left ureteral stone fragments  DISPOSITION OF SPECIMEN:   [facility] for compositional analysis  COUNTS:  YES  TOURNIQUET:  * No tourniquets in log *  DICTATION: .Other Dictation: Dictation Number [id number]  PLAN OF CARE: Discharge to home after PACU  PATIENT DISPOSITION:  PACU - hemodynamically stable.   Delay start of Pharmacological VTE agent (>24hrs) due to surgical blood loss or risk of bleeding: yes

## 2023-05-07 NOTE — Anesthesia Procedure Notes (Signed)
Procedure Name: LMA Insertion Date/Time: 05/07/2023 10:10 AM  Performed by: Deri Fuelling, CRNAPre-anesthesia Checklist: Patient identified, Emergency Drugs available, Suction available and Patient being monitored Patient Re-evaluated:Patient Re-evaluated prior to induction Oxygen Delivery Method: Circle system utilized Preoxygenation: Pre-oxygenation with 100% oxygen Induction Type: IV induction Ventilation: Mask ventilation without difficulty LMA: LMA flexible inserted LMA Size: 4.0 Tube type: Oral Number of attempts: 1 Airway Equipment and Method: Stylet and Oral airway Placement Confirmation: ETT inserted through vocal cords under direct vision, positive ETCO2 and breath sounds checked- equal and bilateral Tube secured with: Tape Dental Injury: Teeth and Oropharynx as per pre-operative assessment

## 2023-05-07 NOTE — H&P (Signed)
Jodi [name] is an 77 y.o. female.    Chief Complaint: Pre-OP LEFT Ureteroscopic Stone Manipulation  HPI:   1 - Left Mid Ureteral Stone - 5mm left mid stone that is solitary by ER CT 03/2023 on eval flank pain. Some very chronic appearing hydro. UCX negative. Stone a upper SI joint area and not seen on scout images. Cr 0.7.   PMH sig for obesity, DM2 (a1c 6s), only surgeyr is cesarean. LIves with son and daughter in law Marylu Lund, very involved helps with interpretation). Spanish speaking. Her PCP is Betty [place] MD   Today "Jodi [name]" is seen to proceed with LEFT ureteroscopic stone manipulation. No interval fevers.    Past Medical History:  Diagnosis Date   Anemia    Arthritis    Diabetes mellitus without complication (HCC)    History of kidney stones    Hypertension    Insomnia    Polio    in childhood   Sleep apnea     Past Surgical History:  Procedure Laterality Date   CESAREAN SECTION     SHOULDER SURGERY Left 2010    Family History  Family history unknown: Yes   Social History:  reports that she has never smoked. She has never used smokeless tobacco. She reports that she does not currently use alcohol. She reports that she does not use drugs.  Allergies:  Allergies  Allergen Reactions   Pork-Derived Products     Rash, joint pain   Shellfish Allergy Rash and Other (See Comments)    joint pain, GI pain    No medications prior to admission.    No results found for this or any previous visit (from the past 48 hour(s)). No results found.  Review of Systems  Constitutional:  Negative for appetite change and fever.  All other systems reviewed and are negative.   There were no vitals taken for this visit. Physical Exam Vitals reviewed.  Constitutional:      Comments: Pleasant, spanish-speaking.   HENT:     Head: Normocephalic.  Eyes:     Pupils: Pupils are equal, round, and reactive to light.  Cardiovascular:     Rate and Rhythm: Normal rate.   Pulmonary:     Effort: Pulmonary effort is normal.  Abdominal:     General: Abdomen is flat.  Genitourinary:    Comments: Minimal left CVAT at present Musculoskeletal:        General: Normal range of motion.     Cervical back: Normal range of motion.  Skin:    General: Skin is warm.  Neurological:     General: No focal deficit present.     Mental Status: She is alert.  Psychiatric:        Mood and Affect: Mood normal.      Assessment/Plan  Proceed as planned with LEFT ureteroscopic stone manipulation. Risks, benefits, alternatives, expected peri-op course discussed previousliy and reiterated today.   Loletta Parish., MD 05/07/2023, 6:49 AM

## 2023-05-07 NOTE — Discharge Instructions (Addendum)
1 - You may have urinary urgency (bladder spasms) and bloody urine on / off with stent in place. This is normal. ? ?2 - Remove tethered stent on Monday morning at home by pulling on string, then blue-white plastic tubing and discarding. Office is open Monday if any problems arise.  ? ?3 - Call MD or go to ER for fever >102, severe pain / nausea / vomiting not relieved by medications, or acute change in medical status ? ?

## 2023-05-07 NOTE — Anesthesia Postprocedure Evaluation (Signed)
Anesthesia Post Note  Patient: Jodi [name]  Procedure(s) Performed: CYSTOSCOPY, LEFT RETROGRADE PYELOGRAM, LEFT URETEROSCOPY, HOLMIUM LASER LITHOTRIPSY, LEFT URETERAL STENT PLACEMENT (Left: Ureter)     Patient location during evaluation: PACU Anesthesia Type: General Level of consciousness: awake and alert, oriented and patient cooperative Pain management: pain level controlled Vital Signs Assessment: post-procedure vital signs reviewed and stable Respiratory status: spontaneous breathing, nonlabored ventilation and respiratory function stable Cardiovascular status: blood pressure returned to baseline and stable Postop Assessment: no apparent nausea or vomiting Anesthetic complications: no   No notable events documented.  Last Vitals:  Vitals:   05/07/23 1115 05/07/23 1125  BP: 112/64 (!) 149/69  Pulse: 66 64  Resp: 18 18  Temp: 36.7 C   SpO2: 98% 93%    Last Pain:  Vitals:   05/07/23 1125  TempSrc:   PainSc: 0-No pain                 Lannie Fields

## 2023-05-07 NOTE — Transfer of Care (Signed)
Immediate Anesthesia Transfer of Care Note  Patient: Jodi [name]  Procedure(s) Performed: CYSTOSCOPY, LEFT RETROGRADE PYELOGRAM, LEFT URETEROSCOPY, HOLMIUM LASER LITHOTRIPSY, LEFT URETERAL STENT PLACEMENT (Left: Ureter)  Patient Location: PACU  Anesthesia Type:General  Level of Consciousness: awake and alert   Airway & Oxygen Therapy: Patient Spontanous Breathing and Patient connected to nasal cannula oxygen  Post-op Assessment: Post -op Vital signs reviewed and stable  Post vital signs: Reviewed and stable  Last Vitals:  Vitals Value Taken Time  BP    Temp    Pulse 75 05/07/23 1045  Resp    SpO2 100 % 05/07/23 1045  Vitals shown include unfiled device data.  Last Pain:  Vitals:   05/07/23 0751  TempSrc:   PainSc: 0-No pain         Complications: No notable events documented.

## 2023-05-07 NOTE — Op Note (Signed)
NAME: Jodi [name], Jodi [name] MEDICAL RECORD NO: [id number] ACCOUNT NO: [id number] DATE OF BIRTH: 06-22-1946 FACILITY: [facility] LOCATION: [facility] PHYSICIAN: Sebastian Ache, MD  Operative Report   DATE OF PROCEDURE: 05/07/2023  PREOPERATIVE DIAGNOSIS:  Left ureteral stone.  PROCEDURE PERFORMED:   1.  Cystoscopy, left retrograde pyelogram interpretation. 2.  Left ureteroscopy with laser lithotripsy. 3.  Insertion of left ureteral stent.  ESTIMATED BLOOD LOSS:  Nil.  COMPLICATIONS:  None.  SPECIMEN:  Left ureteral stone fragments for composition analysis.  FINDINGS: 1.  Distal progression of left ureteral stone at the UVJ approximately 6 mm. 2.  There was complete resolution of all accessible stone fragments larger than one-third mm following laser lithotripsy and basket extraction on the left side. 3.  Placement of left ureteral stent, proximal end in the renal pelvis, distal end in urinary bladder, with tether.  INDICATIONS:  The patient is a very pleasant 77 year old lady, Spanish speaking, who was found on workup of occasional flank pain to have a left mid ureteral stone with significant hydronephrosis, does appear somewhat chronic by imaging.  It was not  easily visible on plain film.  Options were discussed for management including continued medical therapy versus shockwave lithotripsy versus ureteroscopy and we agreed upon the latter with goal of stone free.  Informed consent was obtained and placed in  medical record.  PROCEDURE DETAILS:  The patient being verified and identified.  Procedure being left ureteroscopic stone manipulation was confirmed.  Procedure timeout was performed. Intravenous antibiotics administered.  General anesthesia was induced.  The patient was  placed into a low lithotomy position.  Sterile field was created, prepped and draped the patient's vagina, introitus, and proximal thighs using iodine.  Cystourethroscopy was performed using 21-French rigid cystoscope  with offset lens.  Inspection of  urinary bladder revealed no diverticula, calcifications, papillary lesions.  Ureteral orifices were single.  The left ureteral orifice was cannulated with a 6-French end-hole catheter, and a left retrograde pyelogram was obtained.  Left retrograde pyelogram demonstrated single left ureter, single system left kidney.  There is some mild hydronephrosis to the level of the distal ureter consistent with a filling defect likely representing antegrade progression of prior mid ureteral  stone.  A 0.038 ZIPwire was advanced to the level of the upper poles set aside as a safety wire.  An 8-French feeding tube placed in the urinary bladder for pressure release.  Semirigid ureteroscopy was performed of the distal left ureter alongside a  separate sensor working wire.  As anticipated the stone in question was encountered in the very distal intramural ureter, there was mild impaction noted.  It was too large for simple basketing.  As such, holmium laser energy was applied to the stone  using setting of 0.2 joules and 20 Hz.  The stone was fragmented into approximately 4 smaller pieces that were then amenable to simple basketing and these were removed and set aside for composition analysis as the goal today was to verify stone free  status.  Semirigid ureteroscopy was performed on the remainder of the distal four-fifths of the left ureter.  No mucosal abnormalities were found and the semirigid scope was exchanged over the sensory working wire for the single channel flexible digital  ureteroscope.  This allowed an endoscopic examination of the left kidney, including all calices x2.  No additional calcifications were noted.  This was removed under continuous vision.  There was some mild mucosal edema in the distal ureter.  At the site  of prior  stone impaction, it was felt that the most prudent means of management would be brief interval stenting with tethered stent.  As such, a new 5 x 24  Polaris type stent was carefully placed using fluoroscopic guidance.  Good proximal and distal  planes were noted.  Tether was left in place, trimmed to length, tucked per vagina and the procedure was terminated.  The patient tolerated procedure well, no immediate periprocedural complications.  The patient taken to postanesthesia care unit in  stable condition.  Plan for discharge home.   PUS D: 05/07/2023 10:37:12 am T: 05/07/2023 10:57:00 am  JOB: [id number]/ [id number]

## 2023-05-07 NOTE — Anesthesia Preprocedure Evaluation (Addendum)
Anesthesia Evaluation  Patient identified by MRN, date of birth, ID band Patient awake    Reviewed: Allergy & Precautions, NPO status , Patient's Chart, lab work & pertinent test results  Airway Mallampati: III  TM Distance: >3 FB Neck ROM: Full    Dental  (+) Teeth Intact, Dental Advisory Given   Pulmonary sleep apnea and Continuous Positive Airway Pressure Ventilation    Pulmonary exam normal breath sounds clear to auscultation       Cardiovascular hypertension (144/81 preop), Pt. on medications Normal cardiovascular exam Rhythm:Regular Rate:Normal     Neuro/Psych  PSYCHIATRIC DISORDERS  Depression    negative neurological ROS     GI/Hepatic Neg liver ROS,GERD  Controlled,,  Endo/Other  diabetes, Well Controlled, Type 2  A1c 6.2  Renal/GU Renal disease (L ureteral calculus)  negative genitourinary   Musculoskeletal  (+) Arthritis , Osteoarthritis,    Abdominal   Peds  Hematology negative hematology ROS (+)   Anesthesia Other Findings   Reproductive/Obstetrics negative OB ROS                             Anesthesia Physical Anesthesia Plan  ASA: 2  Anesthesia Plan: General   Post-op Pain Management: Tylenol PO (pre-op)*   Induction: Intravenous  PONV Risk Score and Plan: 4 or greater and Ondansetron, Dexamethasone, Midazolam and Treatment may vary due to age or medical condition  Airway Management Planned: LMA  Additional Equipment: None  Intra-op Plan:   Post-operative Plan: Extubation in OR  Informed Consent: I have reviewed the patients History and Physical, chart, labs and discussed the procedure including the risks, benefits and alternatives for the proposed anesthesia with the patient or authorized representative who has indicated his/her understanding and acceptance.     Dental advisory given  Plan Discussed with: CRNA  Anesthesia Plan Comments:         Anesthesia Quick Evaluation

## 2023-05-08 ENCOUNTER — Encounter (HOSPITAL_COMMUNITY): Payer: Self-pay | Admitting: Urology

## 2023-05-23 ENCOUNTER — Other Ambulatory Visit: Payer: Self-pay | Admitting: Family Medicine

## 2023-05-23 DIAGNOSIS — G8929 Other chronic pain: Secondary | ICD-10-CM

## 2023-05-26 ENCOUNTER — Telehealth: Payer: Self-pay

## 2023-05-26 NOTE — Telephone Encounter (Signed)
Transition Care Management Unsuccessful Follow-up Telephone Call  Date of discharge and from where:  [facility] 9/30  Attempts:  2nd Attempt  Reason for unsuccessful TCM follow-up call:  No answer/busy   Lenard Forth [place]  [facility], [facility] Guide, Phone: [phone] Website: [email or web address]

## 2023-05-26 NOTE — Telephone Encounter (Signed)
Transition Care Management Unsuccessful Follow-up Telephone Call  Date of discharge and from where:  [facility] 9/30  Attempts:  1st Attempt  Reason for unsuccessful TCM follow-up call:  No answer/busy   Lenard Forth [place]  [facility], [facility] Guide, Phone: [phone] Website: [email or web address]

## 2023-05-31 DIAGNOSIS — N201 Calculus of ureter: Secondary | ICD-10-CM | POA: Diagnosis not present

## 2023-06-02 NOTE — Telephone Encounter (Signed)
According to records, med was d/c and she was started on Toradol. She is also due for follow up. Thanks, BJ

## 2023-07-08 ENCOUNTER — Other Ambulatory Visit: Payer: Self-pay | Admitting: Nurse Practitioner

## 2023-07-08 DIAGNOSIS — M1712 Unilateral primary osteoarthritis, left knee: Secondary | ICD-10-CM

## 2023-07-08 NOTE — Telephone Encounter (Signed)
Requested Prescriptions  Refused Prescriptions Disp Refills   diclofenac Sodium (VOLTAREN) 1 % GEL [Pharmacy Med Name: DICLOFENAC SODIUM 1% GEL] 100 g 0    Sig: APPLY 4 G TOPICALLY 4 (FOUR) TIMES DAILY. LEFT KNEE.     Analgesics:  Topicals Failed - 07/08/2023  2:28 PM      Failed - Manual Review: Labs are only required if the patient has taken medication for more than 8 weeks.      Failed - Valid encounter within last 12 months    Recent Outpatient Visits           2 years ago Right ear pain   [facility], Gildardo Pounds, NP   2 years ago Other acute nonsuppurative otitis media of right ear, recurrence not specified   [facility] [place]. [facility] [place], Cari S, [place]   2 years ago Ear pain, right   [facility] [place]. [facility] Jonah Blue B, MD   2 years ago Type 2 diabetes, controlled, with neuropathy [facility])   [facility] [place]. [facility] Jonah Blue B, MD   3 years ago Type 2 diabetes mellitus without complication, without long-term current use of insulin (HCC)   [facility] [place]. [facility] Marcine Matar, MD              Passed - PLT in normal range and within 360 days    Platelets  Date Value Ref Range Status  04/18/2023 378 150 - 400 K/uL Final  01/07/2021 404 150 - 450 x10E3/uL Final         Passed - HGB in normal range and within 360 days    Hemoglobin  Date Value Ref Range Status  04/18/2023 12.5 12.0 - 15.0 g/dL Final  16/04/9603 54.0 11.1 - 15.9 g/dL Final         Passed - HCT in normal range and within 360 days    HCT  Date Value Ref Range Status  04/18/2023 38.0 36.0 - 46.0 % Final   Hematocrit  Date Value Ref Range Status  01/07/2021 38.0 34.0 - 46.6 % Final         Passed - Cr in normal range and within 360 days     Creatinine  Date Value Ref Range Status  03/25/2018 0.72 0.44 - 1.00 mg/dL Final   Creat  Date Value Ref Range Status  10/05/2016 0.66 0.60 - 0.93 mg/dL Final    Comment:      For patients > or = 79 years of age: The upper reference limit for Creatinine is approximately 13% higher for people identified as African-American.      Creatinine, Ser  Date Value Ref Range Status  04/18/2023 0.72 0.44 - 1.00 mg/dL Final   Creatinine,U  Date Value Ref Range Status  07/21/2022 66.4 mg/dL Final         Passed - eGFR is 30 or above and within 360 days    GFR, Est African American  Date Value Ref Range Status  10/05/2016 >89 >=60 mL/min Final   GFR, Est AFR Am  Date Value Ref Range Status  03/25/2018 >60 >60 mL/min Final    Comment:    (NOTE) The eGFR has been calculated using the CKD EPI equation.  This calculation has not been validated in all clinical situations. eGFR's persistently <60 mL/min signify possible Chronic Kidney Disease.    GFR calc Af Amer  Date Value Ref Range Status  05/12/2019 101 >59 mL/min/1.73 Final   GFR, Est Non African American  Date Value Ref Range Status  10/05/2016 89 >=60 mL/min Final   GFR, Estimated  Date Value Ref Range Status  04/18/2023 >60 >60 mL/min Final    Comment:    (NOTE) Calculated using the CKD-EPI Creatinine Equation (2021)   03/25/2018 >60 >60 mL/min Final   GFR  Date Value Ref Range Status  07/21/2022 86.82 >60.00 mL/min Final    Comment:    Calculated using the CKD-EPI Creatinine Equation (2021)   eGFR  Date Value Ref Range Status  01/07/2021 94 >59 mL/min/1.73 Final         Passed - Patient is not pregnant

## 2023-07-14 ENCOUNTER — Other Ambulatory Visit: Payer: Self-pay | Admitting: Family Medicine

## 2023-07-14 DIAGNOSIS — K219 Gastro-esophageal reflux disease without esophagitis: Secondary | ICD-10-CM

## 2023-07-14 DIAGNOSIS — E1169 Type 2 diabetes mellitus with other specified complication: Secondary | ICD-10-CM

## 2023-08-19 NOTE — Progress Notes (Signed)
PATIENT: Jodi [name] DOB: April 16, 1946  REASON FOR VISIT: follow up HISTORY FROM: patient  Chief Complaint  Patient presents with   Obstructive Sleep Apnea    Rm 1 with [facility] interpreter  Pt is well, reports she hasn't been able to use CPAP due to intolerance and couldn't sleep with it. She hasn't used it since last visit.      HISTORY OF PRESENT ILLNESS:  08/23/23 ALL:  Jodi [name] returns for follow up for OSA on CPAP. She was last seen 10/2022 and having a hard time with compliance. Since, she reports continued difficulty using CPAP. She can not tolerate the mask on her face at night. She can not sleep at night. She wakes multiple times fighting with the mask. She has tried multiple masks. She is able to sleep well once she removes the mask. She does not wish to continue using therapy. She is interested in a referral to dentistry to discuss oral appliance.   Debarah Crape assists with interpreting.   11/02/2022 ALL:  Jodi [name] is a 78 y.o. female here today for follow up for OSA on CPAP.  She was seen in consult with Dr Frances Furbish 02/2022 for concerns of sleep apnea. PSG confirmed mild OSA with AHI 9.7/hr and O2 nadir of 75%. Study limited by prolonged period of wakefulness. AutoPAP advised. She has not tolerated therapy well at all. She feels that she is not able to breath. She used a FFM during testing but felt she was smothering. She was send Airfit N30 nasal pillow but reports having a difficult time breathing through her nose. She has only used therapy twice for about 45 minutes. She lives alone. She is willing to use therapy.     EDDY assisted with interpreting for today's visit.   HISTORY: (copied from Dr Teofilo Pod previous note)  Dear Dr. [place],   I saw your patient, Jodi [name], upon your kind request in my sleep clinic today for initial consultation of her sleep disorder, in particular, concern for underlying obstructive sleep apnea.  The patient is accompanied by  Jodi [name], Spanish interpreter today.  As you know, Jodi [name] is a 78 year old right-handed woman with an underlying medical history of diabetes, lumbar degenerative disc disease, hypertension, anemia, history of polio, and overweight state, who reports snoring and excessive daytime somnolence as well as witnessed apneas by family.  I reviewed your office note from 01/16/2022.  Her Epworth sleepiness score is 6 out of 24, fatigue severity score is 33 out of 63.  Her son has noted pauses in her breathing and family reports loud snoring when she is around them.  She lives alone, she is divorced, she goes to bed between 10 and 10:30 PM but has trouble falling asleep, may be awake for a few hours.  Rise time is generally between 6 and 7 AM.  She does not have night to night nocturia but does occasionally wake up with a dull, achy headache.  She does not wake up rested.  She has no family history of sleep apnea as far she knows.  She has 2 sons, 1 son snores, the other does not.  She drinks caffeine in the form of coffee, 1 cup in the morning, she is a non-smoker and does not drink any alcohol.   REVIEW OF SYSTEMS: Out of a complete 14 system review of symptoms, the patient complains only of the following symptoms, claustrophobia and all other reviewed systems are negative.  ESS: 3/24  ALLERGIES: Allergies  Allergen Reactions   Pork-Derived Products     Rash, joint pain   Shellfish Allergy Rash and Other (See Comments)    joint pain, GI pain    HOME MEDICATIONS: Outpatient Medications Prior to Visit  Medication Sig Dispense Refill   alendronate (FOSAMAX) 70 MG tablet TAKE 1 TABLET (70 MG TOTAL) BY MOUTH ONCE A WEEK. TAKE 1 TABLET (70 MG TOTAL) BY MOUTH ONCE A WEEK. 12 tablet 4   amLODipine (NORVASC) 5 MG tablet TAKE 1/2 TABLET POR VIA ORAL A DIARIO (Patient taking differently: Take 2.5 mg by mouth daily.) 45 tablet 3   atorvastatin (LIPITOR) 10 MG tablet TOME 1 TABLETA POR VIA ORAL TODOS LOS DIAS 90  tablet 2   CALCIUM PO Take 1 tablet by mouth daily.     cephALEXin (KEFLEX) 500 MG capsule Take 1 capsule (500 mg total) by mouth 2 (two) times daily. X 3 days to prevent infection with tethered stent. (Please label in Spanish) 6 capsule 0   cholecalciferol (VITAMIN D3) 25 MCG (1000 UNIT) tablet Take 1,000 Units by mouth daily.     cyanocobalamin (VITAMIN B12) 1000 MCG tablet Take 1,000 mcg by mouth daily.     ferrous sulfate 325 (65 FE) MG tablet Take 1 tablet (325 mg total) by mouth daily with breakfast. 90 tablet 1   fluticasone (VERAMYST) 27.5 MCG/SPRAY nasal spray Place 1 spray into the nose daily as needed for rhinitis.     gabapentin (NEURONTIN) 100 MG capsule Take 100 mg by mouth at bedtime.     hydrochlorothiazide (HYDRODIURIL) 12.5 MG tablet TOME UNA TABLETA TODOS LOS DIAS (Patient taking differently: Take 12.5 mg by mouth daily.) 90 tablet 3   ketorolac (TORADOL) 10 MG tablet Take 1 tablet (10 mg total) by mouth every 6 (six) hours as needed for moderate pain (pain score 4-6) (or stent discomfort post-operatively). 20 tablet 0   loratadine (CLARITIN) 10 MG tablet Take 10 mg by mouth daily.     meclizine (ANTIVERT) 12.5 MG tablet Take 12.5 mg by mouth daily as needed for dizziness.     Omega-3 Fatty Acids (FISH OIL) 1000 MG CAPS Take 1,000 mg by mouth daily.     omeprazole (PRILOSEC) 40 MG capsule TOME 1 CAPSULA POR VIA ORAL TODOS LOS DIAS 90 capsule 2   oxyCODONE-acetaminophen (PERCOCET/ROXICET) 5-325 MG tablet Take 1 tablet by mouth every 6 (six) hours as needed for severe pain (pain score 7-10) (post-operatively. (please label in Spanish)). 15 tablet 0   sennosides-docusate sodium (SENOKOT-S) 8.6-50 MG tablet Take 1 tablet by mouth at bedtime as needed for constipation.     triamcinolone cream (KENALOG) 0.1 % APLIQUE AL AREA AFECTADA DOS VECES AL DIA (Patient taking differently: Apply 1 Application topically daily as needed (irritation).) 30 g 1   Vibegron 75 MG TABS Take 1 tablet by  mouth daily. 30 tablet 5   vitamin E 180 MG (400 UNITS) capsule Take 400 Units by mouth daily.     ACCU-CHEK SOFTCLIX LANCETS lancets Use as instructed to test blood glucose twice daily. (Patient not taking: Reported on 08/23/2023) 100 each 12   ondansetron (ZOFRAN-ODT) 4 MG disintegrating tablet Take 1 tablet (4 mg total) by mouth every 8 (eight) hours as needed. (Patient not taking: Reported on 08/23/2023) 20 tablet 0   No facility-administered medications prior to visit.    PAST MEDICAL HISTORY: Past Medical History:  Diagnosis Date   Anemia    Arthritis    Diabetes mellitus without complication (HCC)  History of kidney stones    Hypertension    Insomnia    Polio    in childhood   Sleep apnea     PAST SURGICAL HISTORY: Past Surgical History:  Procedure Laterality Date   CESAREAN SECTION     CYSTOSCOPY/URETEROSCOPY/HOLMIUM LASER/STENT PLACEMENT Left 05/07/2023   Procedure: CYSTOSCOPY, LEFT RETROGRADE PYELOGRAM, LEFT URETEROSCOPY, HOLMIUM LASER LITHOTRIPSY, LEFT URETERAL STENT PLACEMENT;  Surgeon: Loletta Parish., MD;  Location: [facility];  Service: Urology;  Laterality: Left;  60 MINUTES   SHOULDER SURGERY Left 2010    FAMILY HISTORY: Family History  Family history unknown: Yes    SOCIAL HISTORY: Social History   Socioeconomic History   Marital status: Divorced    Spouse name: Not on file   Number of children: Not on file   Years of education: Not on file   Highest education level: Not on file  Occupational History   Not on file  Tobacco Use   Smoking status: Never   Smokeless tobacco: Never  Vaping Use   Vaping status: Never Used  Substance and Sexual Activity   Alcohol use: Not Currently   Drug use: Never   Sexual activity: Not Currently  Other Topics Concern   Not on file  Social History Narrative   ** Merged History Encounter **    Caffiene; 1-cup coffee daily   Working: no retired   Programme researcher, broadcasting/film/video; Human resources officer.    Social Drivers of Research scientist (physical sciences) Strain: Low Risk  (04/16/2022)   Overall Financial Resource Strain (CARDIA)    Difficulty of Paying Living Expenses: Not hard at all  Food Insecurity: No Food Insecurity (04/16/2022)   Hunger Vital Sign    Worried About Running Out of Food in the Last Year: Never true    Ran Out of Food in the Last Year: Never true  Transportation Needs: No Transportation Needs (04/16/2022)   PRAPARE - Administrator, Civil Service (Medical): No    Lack of Transportation (Non-Medical): No  Physical Activity: Sufficiently Active (04/16/2022)   Exercise Vital Sign    Days of Exercise per Week: 5 days    Minutes of Exercise per Session: 60 min  Stress: No Stress Concern Present (04/16/2022)   [employer] of Occupational Health - Occupational Stress Questionnaire    Feeling of Stress : Not at all  Social Connections: Moderately Integrated (04/16/2022)   Social Connection and Isolation Panel [NHANES]    Frequency of Communication with Friends and Family: More than three times a week    Frequency of Social Gatherings with Friends and Family: More than three times a week    Attends Religious Services: More than 4 times per year    Active Member of [employer] or Organizations: Yes    Attends Banker Meetings: More than 4 times per year    Marital Status: Divorced  Intimate Partner Violence: Not At Risk (04/16/2022)   Humiliation, Afraid, Rape, and Kick questionnaire    Fear of Current or Ex-Partner: No    Emotionally Abused: No    Physically Abused: No    Sexually Abused: No     PHYSICAL EXAM  Vitals:   08/23/23 1247  BP: 132/72  Pulse: 77  Weight: 143 lb (64.9 kg)  Height: 4\' 10"  (1.473 m)    Body mass index is 29.89 kg/m.  Generalized: Well developed, in no acute distress  Cardiology: normal rate and rhythm, no murmur noted Respiratory: clear to auscultation bilaterally  Neurological examination  Mentation: Alert oriented to time, place, history taking. Follows  all commands speech and language fluent Cranial nerve II-XII: Pupils were equal round reactive to light. Extraocular movements were full, visual field were full  Motor: The motor testing reveals 5 over 5 strength of all 4 extremities. Good symmetric motor tone is noted throughout.  Gait and station: Gait is normal.    DIAGNOSTIC DATA (LABS, IMAGING, TESTING) - I reviewed patient records, labs, notes, testing and imaging myself where available.      No data to display           Lab Results  Component Value Date   WBC 11.0 (H) 04/18/2023   HGB 12.5 04/18/2023   HCT 38.0 04/18/2023   MCV 91.6 04/18/2023   PLT 378 04/18/2023      Component Value Date/Time   NA 139 04/18/2023 2258   NA 141 01/07/2021 1554   K 3.7 04/18/2023 2258   CL 103 04/18/2023 2258   CO2 25 04/18/2023 2258   GLUCOSE 113 (H) 04/18/2023 2258   BUN 21 04/18/2023 2258   BUN 16 01/07/2021 1554   CREATININE 0.72 04/18/2023 2258   CREATININE 0.72 03/25/2018 0912   CREATININE 0.66 10/05/2016 1536   CALCIUM 9.5 04/18/2023 2258   PROT 7.6 07/21/2022 0947   PROT 7.4 01/07/2021 1554   ALBUMIN 4.2 07/21/2022 0947   ALBUMIN 4.5 01/07/2021 1554   AST 23 07/21/2022 0947   AST 19 03/25/2018 0912   ALT 19 07/21/2022 0947   ALT 13 03/25/2018 0912   ALKPHOS 84 07/21/2022 0947   BILITOT 0.7 07/21/2022 0947   BILITOT 0.3 01/07/2021 1554   BILITOT 0.6 03/25/2018 0912   GFRNONAA >60 04/18/2023 2258   GFRNONAA >60 03/25/2018 0912   GFRNONAA 89 10/05/2016 1536   GFRAA 101 05/12/2019 0907   GFRAA >60 03/25/2018 0912   GFRAA >89 10/05/2016 1536   Lab Results  Component Value Date   CHOL 184 07/21/2022   HDL 72.60 07/21/2022   LDLCALC 91 07/21/2022   TRIG 100.0 07/21/2022   CHOLHDL 3 07/21/2022   Lab Results  Component Value Date   HGBA1C 6.2 (H) 04/30/2023   Lab Results  Component Value Date   VITAMINB12 998 (H) 02/13/2021   Lab Results  Component Value Date   TSH 1.27 02/13/2021     ASSESSMENT AND  PLAN 78 y.o. year old female  has a past medical history of Anemia, Arthritis, Diabetes mellitus without complication (HCC), History of kidney stones, Hypertension, Insomnia, Polio, and Sleep apnea. here with     ICD-10-CM   1. OSA (obstructive sleep apnea)  G47.33 Ambulatory referral to Dentistry    2. Difficulty using continuous positive airway pressure (CPAP) device  Z78.9 Ambulatory referral to Dentistry    3. Hypoxia  R09.02       Alycia Patten [name] has had a hard time tolerating CPAP therapy. No data available. We have reviewed her sleep study and discussed concerns of hypoxia. Risks of untreated sleep apnea review and education materials provided. She agrees to let me refer her to dentistry for consult on an oral appliance. Healthy lifestyle habits encouraged. She will follow up with me as needed. She verbalizes understanding and agreement with this plan.    Orders Placed This Encounter  Procedures   Ambulatory referral to Dentistry    Referral Priority:   Routine    Referral Type:   Consultation    Referral Reason:   Specialty Services Required  Requested Specialty:   Dental General Practice    Number of Visits Requested:   1     No orders of the defined types were placed in this encounter.     Shawnie Dapper, FNP-C 08/23/2023, 1:09 PM [facility] [address], [address] [place], [place] [address] [phone]

## 2023-08-23 ENCOUNTER — Encounter: Payer: Self-pay | Admitting: Family Medicine

## 2023-08-23 ENCOUNTER — Ambulatory Visit (INDEPENDENT_AMBULATORY_CARE_PROVIDER_SITE_OTHER): Payer: Medicare HMO | Admitting: Family Medicine

## 2023-08-23 VITALS — BP 132/72 | HR 77 | Ht <= 58 in | Wt 143.0 lb

## 2023-08-23 DIAGNOSIS — G4733 Obstructive sleep apnea (adult) (pediatric): Secondary | ICD-10-CM | POA: Diagnosis not present

## 2023-08-23 DIAGNOSIS — Z789 Other specified health status: Secondary | ICD-10-CM | POA: Diagnosis not present

## 2023-08-23 DIAGNOSIS — R0902 Hypoxemia: Secondary | ICD-10-CM

## 2023-08-23 NOTE — Patient Instructions (Signed)
We will refer you to a dentist that can talk to you about an oral appliance. Let me know if you change your mind about using CPAP.

## 2023-08-24 ENCOUNTER — Telehealth: Payer: Self-pay | Admitting: Family Medicine

## 2023-08-24 NOTE — Telephone Encounter (Signed)
Referral form for Dr. Henrietta Hoover office has been given to POD 1 for Amy, NP to fill out and sign to fax with referral for dentistry.

## 2023-08-25 NOTE — Telephone Encounter (Signed)
 Referral for dentistry fax to [facility]. Phone: [phone], Fax: [phone].

## 2023-10-10 ENCOUNTER — Other Ambulatory Visit: Payer: Self-pay | Admitting: Obstetrics and Gynecology

## 2023-10-10 DIAGNOSIS — N3281 Overactive bladder: Secondary | ICD-10-CM

## 2023-10-26 ENCOUNTER — Other Ambulatory Visit

## 2023-10-26 ENCOUNTER — Ambulatory Visit (INDEPENDENT_AMBULATORY_CARE_PROVIDER_SITE_OTHER)
Admission: RE | Admit: 2023-10-26 | Discharge: 2023-10-26 | Disposition: A | Discharge status: Home / Self Care | Source: Ambulatory Visit | Attending: Family Medicine | Admitting: Family Medicine

## 2023-10-26 DIAGNOSIS — M8000XD Age-related osteoporosis with current pathological fracture, unspecified site, subsequent encounter for fracture with routine healing: Secondary | ICD-10-CM

## 2023-10-27 NOTE — Progress Notes (Signed)
 Chief Complaint  Patient presents with   Results   Follow-up   HPI: Jodi [name] is a 78 y.o. female with a PMHx significant for HTN, OSA, GERD, DM II, HLD, osteoporosis, vitamin D deficiency, and iron deficiency anemia, among some, who is here today for follow up.   Osteoporosis: No longer on Fosamax , she stopped since her last visit , she took medication for over 5 years.  She is taking calcium with vitamin D.  She is walking sometimes but not consistently. She had a DEXA scan on 4/8: Right Femoral Neck: -2.3 Left Femoral Neck: -2.2 Ultra distal radius: -2.5  She is following with dentist and has undergoing some dental work.  Lab Results  Component Value Date   VD25OH 37.71 02/13/2021   Hyperlipidemia: Currently on Atorvastatin 10 mg daily.  Side effects from medication: none Lab Results  Component Value Date   CHOL 184 07/21/2022   HDL 72.60 07/21/2022   LDLCALC 91 07/21/2022   TRIG 100.0 07/21/2022   CHOLHDL 3 07/21/2022   Hypertension:  Medications: Currently on amlodipine 2.5 mg daily and hydrochlorothiazide 12.5 mg daily. BP readings at home: She checks her BP regularly at home and says it is "good." Negative for unusual or severe headache, visual changes, exertional chest pain, dyspnea, palpitations, focal weakness, or edema.  Lab Results  Component Value Date   CREATININE 0.72 04/18/2023   BUN 21 04/18/2023   NA 139 04/18/2023   K 3.7 04/18/2023   CL 103 04/18/2023   CO2 25 04/18/2023   Diabetes Mellitus II: Dx'ed in 2015. 11/22/13 HgA1C 6.5 and on 01/15/20.  - Checking BG at home: She checks her blood sugar occasionally and says it is 90's and low 100's. - Medications: Not currently on pharmacologic treatment.  - eye exam: 05/2022 - foot exam: Performed today.  - Negative for symptoms of hypoglycemia, polyuria, polydipsia, numbness extremities, foot ulcers/trauma  Lab Results  Component Value Date   HGBA1C 6.2 (H) 04/30/2023   Lab  Results  Component Value Date   MICROALBUR <0.7 07/21/2022   She complains of "weakness" and fatigue for a while now.  She says she is sleeping about 8 hours per night.  She does not feel rested when getting up in the morning. Falls asleep while watching TV She has a hx of OSA but does not want to wear the CPAP, cannot tolerate it. States that it was recommended to try a mouth piece instead but she has not been contacted. She follows with neurology.  OA:  She asks about a refill for her meloxicam for her joint pain. She says it works well to control her pain.  She had been prescribed toradol by her urologist for kidney stones but is no longer taking it.  Lower back pain sometimes radiated to LE's and joints of LE's pain. No edema or erythema.  She also mentions she had two falls last week.  Since her last visit she underwent urologic procedure,05/07/23: Cystoscopy, left ureteroscopy and laser lithotripsy with stent placement. She has recovered well. She follows with Dr. Berneice Heinrich.  Review of Systems  Constitutional:  Negative for activity change, appetite change and fever.  HENT:  Negative for sore throat and trouble swallowing.   Respiratory:  Negative for cough and wheezing.   Gastrointestinal:  Negative for abdominal pain, nausea and vomiting.  Genitourinary:  Negative for decreased urine volume, dysuria and hematuria.  Musculoskeletal:  Positive for arthralgias.  Skin:  Negative for rash.  Neurological:  Negative for syncope and facial asymmetry.  Psychiatric/Behavioral:  Negative for confusion and hallucinations.   See other pertinent positives and negatives in HPI.  Current Outpatient Medications on File Prior to Visit  Medication Sig Dispense Refill   alendronate (FOSAMAX) 70 MG tablet TAKE 1 TABLET (70 MG TOTAL) BY MOUTH ONCE A WEEK. TAKE 1 TABLET (70 MG TOTAL) BY MOUTH ONCE A WEEK. 12 tablet 4   amLODipine (NORVASC) 5 MG tablet TAKE 1/2 TABLET POR VIA ORAL A DIARIO (Patient  taking differently: Take 2.5 mg by mouth daily.) 45 tablet 3   CALCIUM PO Take 1 tablet by mouth daily.     cephALEXin (KEFLEX) 500 MG capsule Take 1 capsule (500 mg total) by mouth 2 (two) times daily. X 3 days to prevent infection with tethered stent. (Please label in Spanish) 6 capsule 0   cholecalciferol (VITAMIN D3) 25 MCG (1000 UNIT) tablet Take 1,000 Units by mouth daily.     cyanocobalamin (VITAMIN B12) 1000 MCG tablet Take 1,000 mcg by mouth daily.     ferrous sulfate 325 (65 FE) MG tablet Take 1 tablet (325 mg total) by mouth daily with breakfast. 90 tablet 1   fluticasone (VERAMYST) 27.5 MCG/SPRAY nasal spray Place 1 spray into the nose daily as needed for rhinitis.     gabapentin (NEURONTIN) 100 MG capsule Take 100 mg by mouth at bedtime.     GEMTESA 75 MG TABS TOME 1 TABLETA POR VIA ORAL TODOS LOS DIAS 90 tablet 3   hydrochlorothiazide (HYDRODIURIL) 12.5 MG tablet TOME UNA TABLETA TODOS LOS DIAS (Patient taking differently: Take 12.5 mg by mouth daily.) 90 tablet 3   loratadine (CLARITIN) 10 MG tablet Take 10 mg by mouth daily.     meclizine (ANTIVERT) 12.5 MG tablet Take 12.5 mg by mouth daily as needed for dizziness.     Omega-3 Fatty Acids (FISH OIL) 1000 MG CAPS Take 1,000 mg by mouth daily.     omeprazole (PRILOSEC) 40 MG capsule TOME 1 CAPSULA POR VIA ORAL TODOS LOS DIAS 90 capsule 2   sennosides-docusate sodium (SENOKOT-S) 8.6-50 MG tablet Take 1 tablet by mouth at bedtime as needed for constipation.     triamcinolone cream (KENALOG) 0.1 % APLIQUE AL AREA AFECTADA DOS VECES AL DIA (Patient taking differently: Apply 1 Application topically daily as needed (irritation).) 30 g 1   vitamin E 180 MG (400 UNITS) capsule Take 400 Units by mouth daily.     No current facility-administered medications on file prior to visit.    Past Medical History:  Diagnosis Date   Anemia    Arthritis    Diabetes mellitus without complication (HCC)    History of kidney stones    Hypertension     Insomnia    Polio    in childhood   Sleep apnea    Allergies  Allergen Reactions   Pork-Derived Products     Rash, joint pain   Shellfish Allergy Rash and Other (See Comments)    joint pain, GI pain    Social History   Socioeconomic History   Marital status: Divorced    Spouse name: Not on file   Number of children: Not on file   Years of education: Not on file   Highest education level: Not on file  Occupational History   Not on file  Tobacco Use   Smoking status: Never   Smokeless tobacco: Never  Vaping Use   Vaping status: Never Used  Substance and Sexual Activity  Alcohol use: Not Currently   Drug use: Never   Sexual activity: Not Currently  Other Topics Concern   Not on file  Social History Narrative   ** Merged History Encounter **    Caffiene; 1-cup coffee daily   Working: no retired   Programme researcher, broadcasting/film/video; Human resources officer.    Social Drivers of Corporate investment banker Strain: Low Risk  (04/16/2022)   Overall Financial Resource Strain (CARDIA)    Difficulty of Paying Living Expenses: Not hard at all  Food Insecurity: No Food Insecurity (04/16/2022)   Hunger Vital Sign    Worried About Running Out of Food in the Last Year: Never true    Ran Out of Food in the Last Year: Never true  Transportation Needs: No Transportation Needs (04/16/2022)   PRAPARE - Administrator, Civil Service (Medical): No    Lack of Transportation (Non-Medical): No  Physical Activity: Sufficiently Active (04/16/2022)   Exercise Vital Sign    Days of Exercise per Week: 5 days    Minutes of Exercise per Session: 60 min  Stress: No Stress Concern Present (04/16/2022)   [employer] of Occupational Health - Occupational Stress Questionnaire    Feeling of Stress : Not at all  Social Connections: Moderately Integrated (04/16/2022)   Social Connection and Isolation Panel [NHANES]    Frequency of Communication with Friends and Family: More than three times a week    Frequency of  Social Gatherings with Friends and Family: More than three times a week    Attends Religious Services: More than 4 times per year    Active Member of Clubs or Organizations: Yes    Attends Banker Meetings: More than 4 times per year    Marital Status: Divorced   Vitals:   10/28/23 0712  BP: 120/70  Pulse: 94  Resp: 16  SpO2: 96%   Body mass index is 29.97 kg/m.  Physical Exam Vitals and nursing note reviewed.  Constitutional:      General: She is not in acute distress.    Appearance: She is well-developed.  HENT:     Head: Normocephalic and atraumatic.     Mouth/Throat:     Mouth: Mucous membranes are moist.     Pharynx: Oropharynx is clear. Uvula midline.  Eyes:     Conjunctiva/sclera: Conjunctivae normal.  Cardiovascular:     Rate and Rhythm: Normal rate and regular rhythm.     Pulses:          Dorsalis pedis pulses are 2+ on the right side and 2+ on the left side.     Heart sounds: No murmur heard. Pulmonary:     Effort: Pulmonary effort is normal. No respiratory distress.     Breath sounds: Normal breath sounds.  Abdominal:     Palpations: Abdomen is soft. There is no hepatomegaly or mass.     Tenderness: There is no abdominal tenderness.  Musculoskeletal:     Right lower leg: No edema.     Left lower leg: No edema.     Comments: No signs of synovitis.  Lymphadenopathy:     Cervical: No cervical adenopathy.  Skin:    General: Skin is warm.     Findings: No erythema or rash.  Neurological:     General: No focal deficit present.     Mental Status: She is alert and oriented to person, place, and time.     Cranial Nerves: No cranial nerve deficit.  Gait: Gait normal.  Psychiatric:        Mood and Affect: Affect normal. Mood is anxious.   ASSESSMENT AND PLAN:  [name] was seen today for follow up. Lab Results  Component Value Date   HGBA1C 6.3 10/28/2023   Lab Results  Component Value Date   NA 141 10/28/2023   CL 103  10/28/2023   K 3.7 10/28/2023   CO2 28 10/28/2023   BUN 24 (H) 10/28/2023   CREATININE 0.62 10/28/2023   GFR 84.33 10/28/2023   CALCIUM 9.9 10/28/2023   PHOS 3.1 10/15/2017   ALBUMIN 4.2 10/28/2023   GLUCOSE 89 10/28/2023   Lab Results  Component Value Date   ALT 16 10/28/2023   AST 21 10/28/2023   ALKPHOS 83 10/28/2023   BILITOT 0.4 10/28/2023   Lab Results  Component Value Date   LABMICR 6.1 01/07/2021   LABMICR <3.0 01/15/2020   LABMICR Comment 01/15/2020   MICROALBUR <0.7 10/28/2023   MICROALBUR <0.7 07/21/2022   Type 2 diabetes mellitus with other specified complication, without long-term current use of insulin (HCC) Assessment & Plan: Comorbidities: OSA, hypertension, and hyperlipidemia. Problem has been adequately controlled on nonpharmacologic treatment. Last hemoglobin A1c was 6.2 in 04/2023. No changes today, further recommendations according to HgA1C result. Annual eye exam, periodic dental and foot care recommended. F/U in 6 months.  Orders: -     Comprehensive metabolic panel with GFR; Future -     Hemoglobin A1c; Future -     Microalbumin / creatinine urine ratio; Future  Age-related osteoporosis without current pathological fracture Assessment & Plan: We discussed recent DEXA report. Continue adequate calcium and vitamin D supplementation as well as fall prevention. She took Fosamax for over 5 years. Recommend trying Prolia, she agrees with recommendation but will complete dental procedures before restarting it. We discussed some side effects.  Orders: -     VITAMIN D 25 Hydroxy (Vit-D Deficiency, Fractures); Future  HTN (hypertension), benign Assessment & Plan: Problem is adequately controlled. Continue amlodipine 5 mg 1/2 tablet daily and HCTZ 12.5 mg daily as well as low-salt diet. Continue monitoring BP regularly. Eye exam is due.  Orders: -     Comprehensive metabolic panel with GFR; Future  Hyperlipidemia, unspecified hyperlipidemia  type Assessment & Plan: She is not fasting today. Last LDL 91 in 07/2022. Continue Atorvastatin 10 mg daily and low fat diet. Will plan on fasting labs next visit.  Orders: -     Atorvastatin Calcium; Take 1 tablet (10 mg total) by mouth daily.  Dispense: 90 tablet; Refill: 3  OSA (obstructive sleep apnea) Assessment & Plan: Today she c/o fatigue and falling asleep while watching TV,not doing so while driving. She has not tolerated CPAP and waiting to try a mouth piece that was recommended as an option. We discussed Dx and possible complications. Follows with neurologist.   Generalized osteoarthritis of multiple sites Assessment & Plan: Meloxicam 7.5 mg daily as needed helped, requesting refills. We discussed Duloxetine last visit ans she is not interested in adding medication. We reviewed some side effects of chronic NSAID's use. Low impact exercise and fall precautions.  Orders: -     Meloxicam; Take 1 tablet (7.5 mg total) by mouth daily as needed for pain.  Dispense: 90 tablet; Refill: 1  Vitamin D deficiency, unspecified Assessment & Plan: Continue current dose of vitamin D supplementation. Further recommendation will be given according to 25 [place] vitamin D result.  Orders: -     VITAMIN  D 25 Hydroxy (Vit-D Deficiency, Fractures); Future   I spent a total of 42 minutes in both face to face and non face to face activities for this visit on the date of this encounter. During this time history was obtained and documented, examination was performed, prior labs/imaging reviewed, and assessment/plan discussed.  Return in about 6 months (around 04/28/2024) for chronic problems.  I, Rolla Etienne Wierda, acting as a scribe for Jenah Vanasten [place], MD., have documented all relevant documentation on the behalf of Kaelani Kendrick [place], MD, as directed by  Lieutenant Abarca [place], MD while in the presence of Norely Schlick [place], MD.   I, Brittay Mogle [place], MD, have reviewed all documentation for this visit. The documentation  on 10/28/23 for the exam, diagnosis, procedures, and orders are all accurate and complete.  Ladeja Pelham G. [place], MD  [facility]. [place] office.

## 2023-10-28 ENCOUNTER — Encounter: Payer: Self-pay | Admitting: Family Medicine

## 2023-10-28 ENCOUNTER — Ambulatory Visit (INDEPENDENT_AMBULATORY_CARE_PROVIDER_SITE_OTHER): Admitting: Family Medicine

## 2023-10-28 VITALS — BP 120/70 | HR 94 | Resp 16 | Ht <= 58 in | Wt 143.4 lb

## 2023-10-28 DIAGNOSIS — E1169 Type 2 diabetes mellitus with other specified complication: Secondary | ICD-10-CM

## 2023-10-28 DIAGNOSIS — E785 Hyperlipidemia, unspecified: Secondary | ICD-10-CM | POA: Diagnosis not present

## 2023-10-28 DIAGNOSIS — M159 Polyosteoarthritis, unspecified: Secondary | ICD-10-CM | POA: Diagnosis not present

## 2023-10-28 DIAGNOSIS — M810 Age-related osteoporosis without current pathological fracture: Secondary | ICD-10-CM | POA: Diagnosis not present

## 2023-10-28 DIAGNOSIS — I10 Essential (primary) hypertension: Secondary | ICD-10-CM | POA: Diagnosis not present

## 2023-10-28 DIAGNOSIS — G4733 Obstructive sleep apnea (adult) (pediatric): Secondary | ICD-10-CM | POA: Diagnosis not present

## 2023-10-28 DIAGNOSIS — E559 Vitamin D deficiency, unspecified: Secondary | ICD-10-CM | POA: Diagnosis not present

## 2023-10-28 LAB — COMPREHENSIVE METABOLIC PANEL WITH GFR
ALT: 16 U/L (ref 0–35)
AST: 21 U/L (ref 0–37)
Albumin: 4.2 g/dL (ref 3.5–5.2)
Alkaline Phosphatase: 83 U/L (ref 39–117)
BUN: 24 mg/dL — ABNORMAL HIGH (ref 6–23)
CO2: 28 meq/L (ref 19–32)
Calcium: 9.9 mg/dL (ref 8.4–10.5)
Chloride: 103 meq/L (ref 96–112)
Creatinine, Ser: 0.62 mg/dL (ref 0.40–1.20)
GFR: 84.33 mL/min (ref >60.00)
Glucose, Bld: 89 mg/dL (ref 70–99)
Potassium: 3.7 meq/L (ref 3.5–5.1)
Sodium: 141 meq/L (ref 135–145)
Total Bilirubin: 0.4 mg/dL (ref 0.2–1.2)
Total Protein: 7.5 g/dL (ref 6.0–8.3)

## 2023-10-28 LAB — MICROALBUMIN / CREATININE URINE RATIO
Creatinine,U: 58.3 mg/dL
Microalb Creat Ratio: UNDETERMINED mg/g (ref 0.0–30.0)
Microalb, Ur: <0.7 mg/dL

## 2023-10-28 LAB — HEMOGLOBIN A1C: Hgb A1c MFr Bld: 6.3 % (ref 4.6–6.5)

## 2023-10-28 LAB — VITAMIN D 25 HYDROXY (VIT D DEFICIENCY, FRACTURES): VITD: 38.65 ng/mL (ref 30.00–100.00)

## 2023-10-28 MED ORDER — ATORVASTATIN CALCIUM 10 MG PO TABS: 10.0000 mg | ORAL_TABLET | Freq: Every day | ORAL | 3 refills | Status: AC

## 2023-10-28 MED ORDER — MELOXICAM 7.5 MG PO TABS: 7.5000 mg | ORAL_TABLET | Freq: Every day | ORAL | 1 refills | Status: DC | PRN

## 2023-10-28 NOTE — Assessment & Plan Note (Signed)
 She is not fasting today. Last LDL 91 in 07/2022. Continue Atorvastatin 10 mg daily and low fat diet. Will plan on fasting labs next visit.

## 2023-10-28 NOTE — Assessment & Plan Note (Addendum)
 Meloxicam 7.5 mg daily as needed helped, requesting refills. We discussed Duloxetine last visit ans she is not interested in adding medication. We reviewed some side effects of chronic NSAID's use. Low impact exercise and fall precautions.

## 2023-10-28 NOTE — Assessment & Plan Note (Addendum)
 Today she c/o fatigue and falling asleep while watching TV,not doing so while driving. She has not tolerated CPAP and waiting to try a mouth piece that was recommended as an option. We discussed Dx and possible complications. Follows with neurologist.

## 2023-10-28 NOTE — Patient Instructions (Addendum)
 A few things to remember from today's visit:  Type 2 diabetes mellitus with other specified complication, without long-term current use of insulin (HCC) - Plan: Comprehensive metabolic panel with GFR, Hemoglobin A1c, Microalbumin / creatinine urine ratio  Age-related osteoporosis without current pathological fracture  HTN (hypertension), benign - Plan: Comprehensive metabolic panel with GFR  Hyperlipidemia, unspecified hyperlipidemia type  OSA (obstructive sleep apnea)  Generalized osteoarthritis of multiple sites - Plan: meloxicam (MOBIC) 7.5 MG tablet  Hyperlipidemia associated with type 2 diabetes mellitus (HCC) - Plan: atorvastatin (LIPITOR) 10 MG tablet  El plan es empezar Prolia despues de que termine el tratamiento odontologico. No cambios en el resto. If you need refills for medications you take chronically, please call your pharmacy. Do not use My Chart to request refills or for acute issues that need immediate attention. If you send a my chart message, it may take a few days to be addressed, specially if I am not in the office.  Please be sure medication list is accurate. If a new problem present, please set up appointment sooner than planned today.

## 2023-10-28 NOTE — Assessment & Plan Note (Addendum)
 Comorbidities: OSA, hypertension, and hyperlipidemia. Problem has been adequately controlled on nonpharmacologic treatment. Last hemoglobin A1c was 6.2 in 04/2023. No changes today, further recommendations according to HgA1C result. Annual eye exam, periodic dental and foot care recommended. F/U in 6 months.

## 2023-10-28 NOTE — Assessment & Plan Note (Signed)
 Continue current dose of vitamin D supplementation. Further recommendation will be given according to 25 OH vitamin D result.

## 2023-10-28 NOTE — Assessment & Plan Note (Signed)
 Problem is adequately controlled. Continue amlodipine 5 mg 1/2 tablet daily and HCTZ 12.5 mg daily as well as low-salt diet. Continue monitoring BP regularly. Eye exam is due.

## 2023-10-28 NOTE — Assessment & Plan Note (Addendum)
 We discussed recent DEXA report. Continue adequate calcium and vitamin D supplementation as well as fall prevention. She took Fosamax for over 5 years. Recommend trying Prolia, she agrees with recommendation but will complete any dental procedure she has pending (root canal and possible dental implant) before restarting it. We discussed some side effects.

## 2023-10-29 ENCOUNTER — Telehealth: Payer: Self-pay | Admitting: Family Medicine

## 2023-10-29 NOTE — Telephone Encounter (Signed)
 Pt came into the office and stated she is ready to start with her Prolia injections since her dentist visit.  She would like a call back once it is approved.

## 2023-11-01 NOTE — Telephone Encounter (Signed)
 Benefits being ran through [employer].

## 2023-11-08 DIAGNOSIS — H5203 Hypermetropia, bilateral: Secondary | ICD-10-CM | POA: Diagnosis not present

## 2023-11-08 DIAGNOSIS — H524 Presbyopia: Secondary | ICD-10-CM | POA: Diagnosis not present

## 2023-11-08 NOTE — Telephone Encounter (Signed)
 Awaiting determination from [employer]

## 2023-11-10 ENCOUNTER — Encounter: Payer: Self-pay | Admitting: Family Medicine

## 2023-11-10 NOTE — Telephone Encounter (Signed)
 Letter mailed to pt in Spanish, expected co-pay is $0; can be scheduled at anytime. Did advise pt that she could still receive a bill from her insurance.

## 2023-11-18 DIAGNOSIS — G4733 Obstructive sleep apnea (adult) (pediatric): Secondary | ICD-10-CM | POA: Diagnosis not present

## 2023-11-24 ENCOUNTER — Ambulatory Visit (INDEPENDENT_AMBULATORY_CARE_PROVIDER_SITE_OTHER): Admitting: Obstetrics and Gynecology

## 2023-11-24 ENCOUNTER — Encounter: Payer: Self-pay | Admitting: Obstetrics and Gynecology

## 2023-11-24 VITALS — BP 116/71 | HR 79

## 2023-11-24 DIAGNOSIS — R350 Frequency of micturition: Secondary | ICD-10-CM

## 2023-11-24 DIAGNOSIS — N3281 Overactive bladder: Secondary | ICD-10-CM

## 2023-11-24 MED ORDER — TROSPIUM CHLORIDE ER 60 MG PO CP24: 60.0000 mg | ORAL_CAPSULE | Freq: Every day | ORAL | 5 refills | Status: DC

## 2023-11-24 NOTE — Progress Notes (Signed)
 [facility] Return Visit  SUBJECTIVE  History of Present Illness:  Due to language barrier, an interpreter was present during the history-taking and subsequent discussion (and for part of the physical exam) with this patient.   Jodi [name] is a 78 y.o. female seen in follow-up for OAB. Plan at last visit was continue Gemtesa  75mg  daily. Patient reports in November she had a kidney stone removed and since then was told to drink a lot of water daily to flush her system. She has since been drinking approximately 120oz of water daily (4L of water). She reports in the last month she has begun having significant leakage despite taking the Gemtesa . Denies SUI.     Past Medical History: Patient  has a past medical history of Anemia, Arthritis, Diabetes mellitus without complication (HCC), History of kidney stones, Hypertension, Insomnia, Osteoporosis, Polio, and Sleep apnea.   Past Surgical History: She  has a past surgical history that includes Cesarean section; Shoulder surgery (Left, 2010); and Cystoscopy/ureteroscopy/holmium laser/stent placement (Left, 05/07/2023).   Medications: She has a current medication list which includes the following prescription(s): amlodipine , atorvastatin , calcium , cholecalciferol , cyanocobalamin , ferrous sulfate , fluticasone , gabapentin , hydrochlorothiazide , loratadine , meclizine , meloxicam , fish oil, omeprazole , sennosides-docusate sodium , triamcinolone  cream, vitamin e, and trospium chloride.   Allergies: Patient is allergic to pork-derived products and shellfish allergy.   Social History: Patient  reports that she has never smoked. She has never used smokeless tobacco. She reports that she does not drink alcohol and does not use drugs.     OBJECTIVE     Physical Exam: Vitals:   11/24/23 0838  BP: 116/71  Pulse: 79   Gen: No apparent distress, A&O x 3.  Detailed Urogynecologic Evaluation:  Deferred.    ASSESSMENT AND PLAN     Ms. [name] is a 78 y.o. with:  1. Overactive bladder   2. Urinary frequency     We discussed that she is over drinking. She should be drinking closer to 60-80oz of water per day. This is adequate for her height and weight to hydrate daily.  We discussed that we can change the medication. She reports she recently had a urine checked with her PCP and this was clear and she has not had UTI symptoms. She would like to try a change in medication. Medication changed to Trsopium 60mg  ER daily. She is not a good candidate for other anticholinergic medications based on her age and she has already failed Gemtesa  75mg  daily and Myrbetriq . We briefly discussed if this medication is not working that we can consider bladder botox or SNM. She seemed more open to botox as a treatment option.   Patient to follow up in 6 weeks for medication follow up or sooner if needed.   Aroura Vasudevan G Krystopher Kuenzel, NP

## 2023-11-24 NOTE — Patient Instructions (Addendum)
 Please stop Gemtesa  and start Trospium 60mg  ER daily.   Monitor the number of leakages.   Please be mindful of how much you drink. You should be drinking around 60-80oz per day  We will have you follow up with Dr. Frutoso Jing if the medication is not working.

## 2023-12-03 ENCOUNTER — Telehealth: Payer: Self-pay

## 2023-12-03 NOTE — Telephone Encounter (Signed)
 Patient came into the office asking if the first and last office visit note and sleep study can be faxed to [facility] (fax# [phone], phone# [phone]). Notes and sleepy study has been faxed.

## 2023-12-03 NOTE — Telephone Encounter (Signed)
 Patient came into clinic to inform us  that she had an allergic react to medication prescribed ( Trospium  60 mg). Patient states throat, knees and leg became swelled after taking the medication on May 8. Patient has disregarded the medication is currently NOT taking it. Patient states she has decrease her water intake from 4 liters to 2 liters of water. Patient has also started to take Gemsta again since she had medication at home.  Please advise [phone]

## 2023-12-06 NOTE — Telephone Encounter (Signed)
 Good Morning,  She can continue the Gemtesa  but if she would like to further discuss botox or sacral nerve modulation she should see Dr. Frutoso Jing in follow up. We talked about this at her last appointment but she wanted to try a different medication at that time. I wouldn't suggest any other medication changes at this time.  Let me know which she would prefer?

## 2023-12-08 ENCOUNTER — Ambulatory Visit: Admitting: Obstetrics and Gynecology

## 2023-12-19 DIAGNOSIS — G4733 Obstructive sleep apnea (adult) (pediatric): Secondary | ICD-10-CM | POA: Diagnosis not present

## 2023-12-20 ENCOUNTER — Ambulatory Visit (INDEPENDENT_AMBULATORY_CARE_PROVIDER_SITE_OTHER)

## 2023-12-20 DIAGNOSIS — M8000XD Age-related osteoporosis with current pathological fracture, unspecified site, subsequent encounter for fracture with routine healing: Secondary | ICD-10-CM

## 2023-12-20 MED ORDER — DENOSUMAB 60 MG/ML ~~LOC~~ SOSY
60.0000 mg | PREFILLED_SYRINGE | SUBCUTANEOUS | Status: DC
  Administered 2023-12-20: 60 mg via SUBCUTANEOUS

## 2023-12-20 NOTE — Progress Notes (Signed)
 Per orders of Dr. [place], injection of Prolia 60 mg/mL given by Philbert Brave. Patient tolerated injection well.

## 2023-12-21 ENCOUNTER — Other Ambulatory Visit: Payer: Self-pay | Admitting: Obstetrics and Gynecology

## 2023-12-21 DIAGNOSIS — R350 Frequency of micturition: Secondary | ICD-10-CM

## 2023-12-21 DIAGNOSIS — N3281 Overactive bladder: Secondary | ICD-10-CM

## 2023-12-22 ENCOUNTER — Other Ambulatory Visit (HOSPITAL_COMMUNITY): Payer: Self-pay

## 2023-12-22 DIAGNOSIS — G4733 Obstructive sleep apnea (adult) (pediatric): Secondary | ICD-10-CM | POA: Diagnosis not present

## 2024-01-03 DIAGNOSIS — G4733 Obstructive sleep apnea (adult) (pediatric): Secondary | ICD-10-CM | POA: Diagnosis not present

## 2024-01-10 ENCOUNTER — Other Ambulatory Visit: Payer: Self-pay | Admitting: Family Medicine

## 2024-01-10 DIAGNOSIS — I10 Essential (primary) hypertension: Secondary | ICD-10-CM

## 2024-01-12 ENCOUNTER — Encounter: Payer: Self-pay | Admitting: Obstetrics and Gynecology

## 2024-01-12 ENCOUNTER — Ambulatory Visit (INDEPENDENT_AMBULATORY_CARE_PROVIDER_SITE_OTHER): Admitting: Obstetrics and Gynecology

## 2024-01-12 DIAGNOSIS — N3281 Overactive bladder: Secondary | ICD-10-CM | POA: Diagnosis not present

## 2024-01-12 MED ORDER — GEMTESA 75 MG PO TABS: 1.0000 | ORAL_TABLET | Freq: Every day | ORAL | 11 refills | Status: AC

## 2024-01-12 NOTE — Progress Notes (Signed)
 [facility] Return Visit  SUBJECTIVE  History of Present Illness: Jodi [name] is a 78 y.o. female seen in follow-up for OAB. Last visit she wanted to change medications due to increase in symptoms. She was started on trospium  and had an allergic reaction (swelling). She went back on the gemtesa .   She was driking 4 L of water per day and has since decreased to about 2 L. Since she has done that, her symptoms have normalized.   If she forgets a dose then she notices urgency returns.   Past Medical History: Patient  has a past medical history of Anemia, Arthritis, Diabetes mellitus without complication (HCC), History of kidney stones, Hypertension, Insomnia, Osteoporosis, Polio, and Sleep apnea.   Past Surgical History: She  has a past surgical history that includes Cesarean section; Shoulder surgery (Left, 2010); and Cystoscopy/ureteroscopy/holmium laser/stent placement (Left, 05/07/2023).   Medications: She has a current medication list which includes the following prescription(s): amlodipine , atorvastatin , calcium , cholecalciferol , cyanocobalamin , ferrous sulfate , fluticasone , fluticasone , gabapentin , hydrochlorothiazide , loratadine , meclizine , meloxicam , fish oil, omeprazole , sennosides-docusate sodium , triamcinolone  cream, vitamin e, and gemtesa , and the following Facility-Administered Medications: denosumab .   Allergies: Patient is allergic to pork-derived products, trospium , and shellfish allergy.   Social History: Patient  reports that she has never smoked. She has never used smokeless tobacco. She reports that she does not drink alcohol and does not use drugs.     OBJECTIVE     Physical Exam: There were no vitals filed for this visit. Gen: No apparent distress, A&O x 3.  Detailed Urogynecologic Evaluation:  Deferred.    ASSESSMENT AND PLAN    [name] is a 78 y.o. with:  1. Overactive bladder     Overactive bladder -     Gemtesa ; Take 1  tablet (75 mg total) by mouth daily.  Dispense: 30 tablet; Refill: 11  - Continue Gemtesa , refill provided. Follow up 1 year or sooner if needed.    Jodi LOISE Caper, MD  Time spent: I spent 17 minutes dedicated to the care of this patient on the date of this encounter to include pre-visit review of records, face-to-face time with the patient and post visit documentation and ordering medication/ testing.

## 2024-01-18 DIAGNOSIS — G4733 Obstructive sleep apnea (adult) (pediatric): Secondary | ICD-10-CM | POA: Diagnosis not present

## 2024-03-13 DIAGNOSIS — G4733 Obstructive sleep apnea (adult) (pediatric): Secondary | ICD-10-CM | POA: Diagnosis not present

## 2024-03-29 ENCOUNTER — Other Ambulatory Visit: Payer: Self-pay

## 2024-03-29 DIAGNOSIS — M8000XD Age-related osteoporosis with current pathological fracture, unspecified site, subsequent encounter for fracture with routine healing: Secondary | ICD-10-CM

## 2024-03-29 DIAGNOSIS — M810 Age-related osteoporosis without current pathological fracture: Secondary | ICD-10-CM

## 2024-03-29 MED ORDER — DENOSUMAB 60 MG/ML ~~LOC~~ SOSY: 60.0000 mg | PREFILLED_SYRINGE | SUBCUTANEOUS | Status: DC

## 2024-03-30 ENCOUNTER — Ambulatory Visit: Payer: Self-pay

## 2024-03-30 NOTE — Telephone Encounter (Signed)
 FYI Only or Action Required?: FYI only for provider.  Patient was last seen in primary care on 10/28/2023 by [place], Betty G, MD.  Called Nurse Triage reporting Flank Pain.  Symptoms began today.  Interventions attempted: Nothing.  Symptoms are: unchanged.  Triage Disposition: See Physician Within 24 Hours  Patient/caregiver understands and will follow disposition?: Yes, but will wait    Copied from CRM #[id number]. Topic: Clinical - Red Word Triage >> Mar 30, 2024 12:47 PM Deaijah H wrote: Red Word that prompted transfer to Nurse Triage: Pain on kidney   ----------------------------------------------------------------------- From previous Reason for Contact - Scheduling: Patient/patient representative is calling to schedule an appointment. Refer to attachments for appointment information. Reason for Disposition  MODERATE pain (e.g., interferes with normal activities or awakens from sleep)  Answer Assessment - Initial Assessment Questions Additional info: 1) Same pain one month ago used Meloxicam  with effect. Feels muscular pain. No urinary symptoms.  2) Requests follow up with pcp only. Scheduled next available on Monday 04/03/24. Patient states if pain becomes worse or develops new symptoms she will seek evaluation at urgent care.   1. LOCATION: Where does it hurt? (e.g., left, right)     Left side 2. ONSET: When did the pain start?     Today 3. SEVERITY: How bad is the pain? (e.g., Scale 1-10; mild, moderate, or severe)     'hurts' 4. PATTERN: Does the pain come and go, or is it constant?      constant 5. CAUSE: What do you think is causing the pain?     Unsure 6. OTHER SYMPTOMS:  Do you have any other symptoms? (e.g., fever, abdomen pain, vomiting, leg weakness, burning with urination, blood in urine)     Denies 7. PREGNANCY:  Is there any chance you are pregnant? When was your last menstrual period?  Protocols used: Flank Pain-A-AH

## 2024-03-31 NOTE — Telephone Encounter (Signed)
 Will plan on discussing this problem during visit, 04/03/24. BJ

## 2024-04-03 ENCOUNTER — Ambulatory Visit (INDEPENDENT_AMBULATORY_CARE_PROVIDER_SITE_OTHER): Admitting: Family Medicine

## 2024-04-03 ENCOUNTER — Encounter: Payer: Self-pay | Admitting: Family Medicine

## 2024-04-03 ENCOUNTER — Ambulatory Visit: Payer: Self-pay | Admitting: Family Medicine

## 2024-04-03 VITALS — BP 124/84 | HR 85 | Temp 98.1°F | Resp 16 | Ht <= 58 in | Wt 147.2 lb

## 2024-04-03 DIAGNOSIS — Z23 Encounter for immunization: Secondary | ICD-10-CM | POA: Diagnosis not present

## 2024-04-03 DIAGNOSIS — D500 Iron deficiency anemia secondary to blood loss (chronic): Secondary | ICD-10-CM

## 2024-04-03 DIAGNOSIS — E785 Hyperlipidemia, unspecified: Secondary | ICD-10-CM | POA: Diagnosis not present

## 2024-04-03 DIAGNOSIS — M159 Polyosteoarthritis, unspecified: Secondary | ICD-10-CM

## 2024-04-03 DIAGNOSIS — I10 Essential (primary) hypertension: Secondary | ICD-10-CM | POA: Diagnosis not present

## 2024-04-03 DIAGNOSIS — E1169 Type 2 diabetes mellitus with other specified complication: Secondary | ICD-10-CM | POA: Diagnosis not present

## 2024-04-03 DIAGNOSIS — R109 Unspecified abdominal pain: Secondary | ICD-10-CM

## 2024-04-03 DIAGNOSIS — Z1231 Encounter for screening mammogram for malignant neoplasm of breast: Secondary | ICD-10-CM | POA: Diagnosis not present

## 2024-04-03 DIAGNOSIS — E559 Vitamin D deficiency, unspecified: Secondary | ICD-10-CM

## 2024-04-03 LAB — LIPID PANEL
Cholesterol: 183 mg/dL (ref 0–200)
HDL: 67.7 mg/dL (ref >39.00)
LDL Cholesterol: 96 mg/dL (ref 0–99)
NonHDL: 114.85
Total CHOL/HDL Ratio: 3
Triglycerides: 92 mg/dL (ref 0.0–149.0)
VLDL: 18.4 mg/dL (ref 0.0–40.0)

## 2024-04-03 LAB — FERRITIN: Ferritin: 34.8 ng/mL (ref 10.0–291.0)

## 2024-04-03 LAB — CBC
HCT: 36.5 % (ref 36.0–46.0)
Hemoglobin: 12 g/dL (ref 12.0–15.0)
MCHC: 33 g/dL (ref 30.0–36.0)
MCV: 90.7 fl (ref 78.0–100.0)
Platelets: 342 K/uL (ref 150.0–400.0)
RBC: 4.03 Mil/uL (ref 3.87–5.11)
RDW: 14.7 % (ref 11.5–15.5)
WBC: 4.2 K/uL (ref 4.0–10.5)

## 2024-04-03 LAB — BASIC METABOLIC PANEL WITH GFR
BUN: 18 mg/dL (ref 6–23)
CO2: 27 meq/L (ref 19–32)
Calcium: 9.6 mg/dL (ref 8.4–10.5)
Chloride: 103 meq/L (ref 96–112)
Creatinine, Ser: 0.57 mg/dL (ref 0.40–1.20)
GFR: 85.79 mL/min (ref >60.00)
Glucose, Bld: 99 mg/dL (ref 70–99)
Potassium: 3.6 meq/L (ref 3.5–5.1)
Sodium: 139 meq/L (ref 135–145)

## 2024-04-03 LAB — POCT URINALYSIS DIPSTICK
Bilirubin, UA: NEGATIVE
Blood, UA: POSITIVE
Glucose, UA: NEGATIVE
Ketones, UA: NEGATIVE
Nitrite, UA: NEGATIVE
Protein, UA: POSITIVE — AB
Spec Grav, UA: 1.025 (ref 1.010–1.025)
Urobilinogen, UA: 0.2 U/dL
pH, UA: 6 (ref 5.0–8.0)

## 2024-04-03 LAB — HM MAMMOGRAPHY

## 2024-04-03 LAB — POCT GLYCOSYLATED HEMOGLOBIN (HGB A1C): Hemoglobin A1C: 5.8 % — AB (ref 4.0–5.6)

## 2024-04-03 LAB — IRON: Iron: 60 ug/dL (ref 42–145)

## 2024-04-03 LAB — SEDIMENTATION RATE: Sed Rate: 42 mm/h — ABNORMAL HIGH (ref 0–30)

## 2024-04-03 LAB — C-REACTIVE PROTEIN: CRP: <1 mg/dL (ref 0.5–20.0)

## 2024-04-03 MED ORDER — DULOXETINE HCL 30 MG PO CPEP: 30.0000 mg | ORAL_CAPSULE | Freq: Every day | ORAL | 3 refills | Status: DC

## 2024-04-03 NOTE — Assessment & Plan Note (Signed)
 Last LDL 91 in 07/2022. Currently on atorvastatin  10 mg daily. Further recommendation will be given according to lipid panel result.

## 2024-04-03 NOTE — Assessment & Plan Note (Addendum)
 Continue ferrous sulfate  325 mg daily. Further recommendation will be given according to CBC/iron studies results. Last colonoscopy 07/2016.

## 2024-04-03 NOTE — Progress Notes (Signed)
 Chief Complaint  Patient presents with   Flank Pain    Pt points to L side pain.    Discussed the use of AI scribe software for clinical note transcription with the patient, who gave verbal consent to proceed.  History of Present Illness Jodi [name] is an 78 year old female a PMHx significant for HTN, OSA, GERD, DM II, HLD, osteoporosis, vitamin D  deficiency, and iron deficiency anemia here today c/o left-sided back pain. She was last seen on 10/28/2023.  She has been experiencing severe left-sided back pain for approximately two months, with intensity sometimes reaching 10 out of 10. The pain is primarily nocturnal, radiating from the back to the left lateral thigh, and improves when lying on her stomach. It resembles previous kidney stone episodes but lacks symptoms like nausea,vomiting, or diarrhea. No dysuria, hematuria, or changes in urine odor. She takes meloxicam  for pain relief, which she finds effective.  She also experiences generalized body pain affecting her joints of arms, legs, and feet, with noticeable foot deformity. She sometimes wakes up with severe pain after increased activity the day before. She uses meloxicam  as needed.  She is on Prolia  injections every six months for osteoporosis. She takes omeprazole  40 mg daily for a hiatal hernia, although she sometimes skips doses without experiencing stomach issues.  No fever, normal appetite, or respiratory issues.  She is on amlodipine  5 mg daily and hydrochlorothiazide  25 mg daily for hypertension, with home blood pressure readings around 138/84 mmHg.  Negative for unusual or severe headache, visual changes, exertional chest pain, dyspnea,  focal weakness, or edema.  Lab Results  Component Value Date   NA 141 10/28/2023   CL 103 10/28/2023   K 3.7 10/28/2023   CO2 28 10/28/2023   BUN 24 (H) 10/28/2023   CREATININE 0.62 10/28/2023   GFR 84.33 10/28/2023   CALCIUM  9.9 10/28/2023   PHOS 3.1 10/15/2017    ALBUMIN 4.2 10/28/2023   GLUCOSE 89 10/28/2023   Diabetes Mellitus II:  Dx'ed in 2015 . Managed through diet, with blood sugar levels generally well-controlled, occasionally around 99 to 100 mg/dL. She does not take medication for diabetes.  Negative for symptoms of hypoglycemia, polyuria, polydipsia, foot ulcers/trauma. RLE numbness, intermittent and stable. She took Gabapentin  in the past.  Lab Results  Component Value Date   HGBA1C 6.3 10/28/2023   Lab Results  Component Value Date   MICROALBUR <0.7 10/28/2023   Hyperlipidemia: Currently on atorvastatin  10 mg daily.  Lab Results  Component Value Date   CHOL 184 07/21/2022   HDL 72.60 07/21/2022   LDLCALC 91 07/21/2022   TRIG 100.0 07/21/2022   CHOLHDL 3 07/21/2022   Iron deficiency anemia on ferrous sulfate  325 mg daily. She has not noted abdominal pain, blood in the stool, or melena. Lab Results  Component Value Date   WBC 11.0 (H) 04/18/2023   HGB 12.5 04/18/2023   HCT 38.0 04/18/2023   MCV 91.6 04/18/2023   PLT 378 04/18/2023   Review of Systems  Constitutional:  Positive for fatigue. Negative for activity change, appetite change and fever.  HENT:  Negative for sore throat and trouble swallowing.   Respiratory:  Negative for cough and wheezing.   Endocrine: Negative for cold intolerance and heat intolerance.  Genitourinary:  Negative for decreased urine volume.  Musculoskeletal:  Positive for arthralgias and back pain. Negative for gait problem.  Skin:  Negative for rash.  Neurological:  Negative for syncope and facial asymmetry.  Psychiatric/Behavioral:  Negative for confusion and hallucinations.   See other pertinent positives and negatives in HPI.  Current Outpatient Medications on File Prior to Visit  Medication Sig Dispense Refill   amLODipine  (NORVASC ) 5 MG tablet TOME MEDIA TABLETA DIARIAMENTE POR VIA ORAL 45 tablet 3   atorvastatin  (LIPITOR) 10 MG tablet Take 1 tablet (10 mg total) by mouth daily. 90  tablet 3   CALCIUM  PO Take 1 tablet by mouth daily.     cholecalciferol  (VITAMIN D3) 25 MCG (1000 UNIT) tablet Take 1,000 Units by mouth daily.     cyanocobalamin  (VITAMIN B12) 1000 MCG tablet Take 1,000 mcg by mouth daily.     ferrous sulfate  325 (65 FE) MG tablet Take 1 tablet (325 mg total) by mouth daily with breakfast. 90 tablet 1   fluticasone  (FLONASE ) 50 MCG/ACT nasal spray Place 2 sprays into both nostrils daily.     fluticasone  (VERAMYST) 27.5 MCG/SPRAY nasal spray Place 1 spray into the nose daily as needed for rhinitis.     GEMTESA  75 MG TABS Take 1 tablet (75 mg total) by mouth daily. 30 tablet 11   hydrochlorothiazide  (HYDRODIURIL ) 12.5 MG tablet TOME 1 TABLETA POR VIA ORAL TODOS LOS DIAS 90 tablet 3   loratadine  (CLARITIN ) 10 MG tablet Take 10 mg by mouth daily.     meclizine  (ANTIVERT ) 12.5 MG tablet Take 12.5 mg by mouth daily as needed for dizziness.     meloxicam  (MOBIC ) 7.5 MG tablet Take 1 tablet (7.5 mg total) by mouth daily as needed for pain. 90 tablet 1   Omega-3 Fatty Acids (FISH OIL) 1000 MG CAPS Take 1,000 mg by mouth daily.     omeprazole  (PRILOSEC) 40 MG capsule TOME 1 CAPSULA POR VIA ORAL TODOS LOS DIAS 90 capsule 2   sennosides-docusate sodium  (SENOKOT-S) 8.6-50 MG tablet Take 1 tablet by mouth at bedtime as needed for constipation.     vitamin E 180 MG (400 UNITS) capsule Take 400 Units by mouth daily.     triamcinolone  cream (KENALOG ) 0.1 % APLIQUE AL AREA AFECTADA DOS VECES AL DIA (Patient taking differently: Apply 1 Application topically daily as needed (irritation).) 30 g 1   Current Facility-Administered Medications on File Prior to Visit  Medication Dose Route Frequency Provider Last Rate Last Admin   denosumab  (PROLIA ) injection 60 mg  60 mg Subcutaneous Q6 months [place], Eyana Stolze G, MD   60 mg at 12/20/23 1504   [START ON 06/20/2024] denosumab  (PROLIA ) injection 60 mg  60 mg Subcutaneous Q6 months [place], Esli Jernigan G, MD        Past Medical History:   Diagnosis Date   Anemia    Arthritis    Diabetes mellitus without complication (HCC)    History of kidney stones    Hypertension    Insomnia    Osteoporosis    Polio    in childhood   Sleep apnea     Allergies  Allergen Reactions   Pork-Derived Products     Rash, joint pain   Trospium  Swelling   Shellfish Allergy Rash and Other (See Comments)    joint pain, GI pain    Social History   Socioeconomic History   Marital status: Divorced    Spouse name: Not on file   Number of children: Not on file   Years of education: Not on file   Highest education level: Not on file  Occupational History   Not on file  Tobacco Use   Smoking status: Never  Smokeless tobacco: Never  Vaping Use   Vaping status: Never Used  Substance and Sexual Activity   Alcohol use: Never   Drug use: Never   Sexual activity: Not Currently    Partners: Male  Other Topics Concern   Not on file  Social History Narrative   ** Merged History Encounter **    Caffiene; 1-cup coffee daily   Working: no retired   Programme researcher, broadcasting/film/video; Human resources officer.    Social Drivers of Corporate investment banker Strain: Low Risk  (04/16/2022)   Overall Financial Resource Strain (CARDIA)    Difficulty of Paying Living Expenses: Not hard at all  Food Insecurity: No Food Insecurity (04/16/2022)   Hunger Vital Sign    Worried About Running Out of Food in the Last Year: Never true    Ran Out of Food in the Last Year: Never true  Transportation Needs: No Transportation Needs (04/16/2022)   PRAPARE - Administrator, Civil Service (Medical): No    Lack of Transportation (Non-Medical): No  Physical Activity: Sufficiently Active (04/16/2022)   Exercise Vital Sign    Days of Exercise per Week: 5 days    Minutes of Exercise per Session: 60 min  Stress: No Stress Concern Present (04/16/2022)   [employer] of Occupational Health - Occupational Stress Questionnaire    Feeling of Stress : Not at all  Social Connections:  Moderately Integrated (04/16/2022)   Social Connection and Isolation Panel    Frequency of Communication with Friends and Family: More than three times a week    Frequency of Social Gatherings with Friends and Family: More than three times a week    Attends Religious Services: More than 4 times per year    Active Member of [employer] or Organizations: Yes    Attends Banker Meetings: More than 4 times per year    Marital Status: Divorced    Today's Vitals   04/03/24 0741  BP: 124/84  Pulse: 85  Resp: 16  Temp: 98.1 F (36.7 C)  SpO2: 96%  Weight: 147 lb 3.2 oz (66.8 kg)  Height: 4' 10 (1.473 m)   Body mass index is 30.76 kg/m.  Physical Exam Vitals and nursing note reviewed.  Constitutional:      General: She is not in acute distress.    Appearance: She is well-developed.  HENT:     Head: Normocephalic and atraumatic.     Mouth/Throat:     Mouth: Mucous membranes are moist.     Pharynx: Oropharynx is clear.  Eyes:     Conjunctiva/sclera: Conjunctivae normal.  Cardiovascular:     Rate and Rhythm: Normal rate and regular rhythm.     Pulses:          Dorsalis pedis pulses are 2+ on the right side and 2+ on the left side.     Heart sounds: No murmur heard. Pulmonary:     Effort: Pulmonary effort is normal. No respiratory distress.     Breath sounds: Normal breath sounds.  Abdominal:     Palpations: Abdomen is soft. There is no hepatomegaly or mass.     Tenderness: There is no abdominal tenderness.  Musculoskeletal:     Lumbar back: Tenderness present. Negative right straight leg raise test and negative left straight leg raise test.       Back:     Right knee: Crepitus present. No erythema.     Left knee: Crepitus present. No erythema.     Comments:  No signs of synovitis.  Lymphadenopathy:     Cervical: No cervical adenopathy.  Skin:    General: Skin is warm.     Findings: No erythema or rash.  Neurological:     General: No focal deficit present.      Mental Status: She is alert and oriented to person, place, and time.     Gait: Gait normal.  Psychiatric:        Mood and Affect: Affect normal. Mood is anxious.   ASSESSMENT AND PLAN:  Sharmeka was seen today for flank pain.  Diagnoses and all orders for this visit:  Orders Placed This Encounter  Procedures   Urine Culture   CBC   C-reactive protein   Sedimentation rate   Lipid panel   Basic metabolic panel with GFR   Iron   Ferritin   POC HgB A1c   POC Urinalysis Dipstick   Lab Results  Component Value Date   HGBA1C 5.8 (A) 04/03/2024   Lab Results  Component Value Date   WBC 4.2 04/03/2024   HGB 12.0 04/03/2024   HCT 36.5 04/03/2024   MCV 90.7 04/03/2024   PLT 342.0 04/03/2024   Lab Results  Component Value Date   NA 139 04/03/2024   CL 103 04/03/2024   K 3.6 04/03/2024   CO2 27 04/03/2024   BUN 18 04/03/2024   CREATININE 0.57 04/03/2024   GFR 85.79 04/03/2024   CALCIUM  9.6 04/03/2024   PHOS 3.1 10/15/2017   ALBUMIN 4.2 10/28/2023   GLUCOSE 99 04/03/2024   Lab Results  Component Value Date   CHOL 183 04/03/2024   HDL 67.70 04/03/2024   LDLCALC 96 04/03/2024   TRIG 92.0 04/03/2024   CHOLHDL 3 04/03/2024   Lab Results  Component Value Date   ESRSEDRATE 42 (H) 04/03/2024   Lab Results  Component Value Date   CRP <1.0 04/03/2024   Type 2 diabetes mellitus with other specified complication, without long-term current use of insulin (HCC) Assessment & Plan: HgA1C at goal, 5.8 today. Continue nonpharmacologic treatment. Annual eye exam, periodic dental and foot care recommended. F/U in 5-6 months.  Orders: -     POCT glycosylated hemoglobin (Hb A1C)  HTN (hypertension), benign Assessment & Plan: BP adequately controlled. Continue amlodipine  5 mg 1/2 tablet daily and HCTZ 25 mg daily. Continue low-salt diet. Continue monitoring BP regularly. Eye exam is current.  Orders: -     Basic metabolic panel with GFR; Future  Hyperlipidemia,  unspecified hyperlipidemia type Assessment & Plan: Last LDL 91 in 07/2022. Currently on atorvastatin  10 mg daily. Further recommendation will be given according to lipid panel result.  Orders: -     Lipid panel; Future  Flank pain Left-sided back pain. We discussed possible etiologies, history and examination do not suggest a serious process, it seems to be musculoskeletal. Urine dipstick done before visit with some abnormalities, she is not having urinary symptoms, so further recommendation will be given according to urine culture result. Monitor for new symptoms. I do not think imaging is needed at this time. Continue meloxicam  7.5 mg daily as needed. Duloxetine  30 mg started today may help.  -     POCT urinalysis dipstick -     Urine Culture; Future -     CBC; Future -     C-reactive protein; Future -     Sedimentation rate; Future -     Basic metabolic panel with GFR; Future  Generalized osteoarthritis of multiple sites Assessment & Plan:  We discussed diagnosis, prognosis, and treatment options. Continue meloxicam  7.5 mg daily as needed. After discussion of some side effects, she agrees with trying duloxetine  30 mg daily. Follow-up in 3 to 4 months.  Orders: -     DULoxetine  HCl; Take 1 capsule (30 mg total) by mouth daily.  Dispense: 30 capsule; Refill: 3 -     C-reactive protein; Future -     Sedimentation rate; Future  Iron deficiency anemia due to chronic blood loss Assessment & Plan: Continue ferrous sulfate  325 mg daily. Further recommendation will be given according to CBC/iron studies results. Last colonoscopy 07/2016.   Orders: -     Iron; Future -     Ferritin; Future  I personally spent a total of 42 minutes in the care of the patient today including preparing to see the patient, getting/reviewing separately obtained history, performing a medically appropriate exam/evaluation, counseling and educating, placing orders, documenting clinical information in the  EHR, and communicating results.  Return in about 4 months (around 07/27/2024) for chronic problems.  Minerva Bluett [place], MD [facility] [place] office.

## 2024-04-03 NOTE — Assessment & Plan Note (Signed)
 We discussed diagnosis, prognosis, and treatment options. Continue meloxicam  7.5 mg daily as needed. After discussion of some side effects, she agrees with trying duloxetine  30 mg daily. Follow-up in 3 to 4 months.

## 2024-04-03 NOTE — Assessment & Plan Note (Signed)
 HgA1C at goal, 5.8 today. Continue nonpharmacologic treatment. Annual eye exam, periodic dental and foot care recommended. F/U in 5-6 months.

## 2024-04-03 NOTE — Patient Instructions (Addendum)
 A few things to remember from today's visit:  Type 2 diabetes mellitus with other specified complication, without long-term current use of insulin (HCC) - Plan: POC HgB A1c  HTN (hypertension), benign - Plan: Basic metabolic panel with GFR  Vitamin D  deficiency, unspecified  Hyperlipidemia, unspecified hyperlipidemia type - Plan: Lipid panel  Flank pain - Plan: POC Urinalysis Dipstick, Urine Culture, CBC, C-reactive protein, Sedimentation rate, Basic metabolic panel with GFR  Generalized osteoarthritis of multiple sites - Plan: DULoxetine  (CYMBALTA ) 30 MG capsule, C-reactive protein, Sedimentation rate  El dolor de espalda parece muscular. Duloxetine  30 mg diario para ayudar con el dolor de las articulaciones y la espalda. Lo toma diario. Continue Meloxicam  diario si tiene dolor. La veo en 3-4 meses.

## 2024-04-03 NOTE — Assessment & Plan Note (Addendum)
 BP adequately controlled. Continue amlodipine  5 mg 1/2 tablet daily and HCTZ 25 mg daily. Continue low-salt diet. Continue monitoring BP regularly. Eye exam is current.

## 2024-04-04 LAB — URINE CULTURE
MICRO NUMBER:: 16967928
Result:: NO GROWTH
SPECIMEN QUALITY:: ADEQUATE

## 2024-04-07 ENCOUNTER — Other Ambulatory Visit: Payer: Self-pay | Admitting: Family Medicine

## 2024-04-07 DIAGNOSIS — K219 Gastro-esophageal reflux disease without esophagitis: Secondary | ICD-10-CM

## 2024-04-10 ENCOUNTER — Encounter: Payer: Self-pay | Admitting: Family Medicine

## 2024-04-10 DIAGNOSIS — G4733 Obstructive sleep apnea (adult) (pediatric): Secondary | ICD-10-CM | POA: Diagnosis not present

## 2024-04-10 NOTE — Telephone Encounter (Signed)
 Attempted to reach patient using interpretor [phone]. No answer. Voicemail left for patient to return call.

## 2024-04-12 NOTE — Progress Notes (Signed)
 Spoke with patient regarding lab results and Dr. Gib recommendations using language line interpretor, [phone]. Questions answered. Patient voiced understanding.

## 2024-04-26 ENCOUNTER — Other Ambulatory Visit: Payer: Self-pay | Admitting: Family Medicine

## 2024-04-26 DIAGNOSIS — M159 Polyosteoarthritis, unspecified: Secondary | ICD-10-CM

## 2024-05-22 ENCOUNTER — Telehealth: Payer: Self-pay

## 2024-05-22 NOTE — Telephone Encounter (Signed)
 Prolia VOB initiated via [email or web address]  Next Prolia inj DUE: 08/11/23

## 2024-05-23 ENCOUNTER — Other Ambulatory Visit (HOSPITAL_COMMUNITY): Payer: Self-pay

## 2024-05-23 NOTE — Telephone Encounter (Signed)
 Pt ready for scheduling for PROLIA  on or after : 05/23/24  Option# 1: Buy/Bill (Office supplied medication)  Out-of-pocket cost due at time of clinic visit: $357  Number of injection/visits approved: 2  Primary: UHC-MEDICARE Prolia  co-insurance: 20% Admin fee co-insurance: 20%  Secondary: --- Prolia  co-insurance:  Admin fee co-insurance:   Medical Benefit Details: Date Benefits were checked: 05/23/24 Deductible: $257 Met of $257 Required/ Coinsurance: 20%/ Admin Fee: 20%  Prior Auth: APPROVED PA# [id number] Expiration Date: 05/23/24-05/23/25  # of doses approved: 2 ----------------------------------------------------------------------- Option# 2- Med Obtained from pharmacy: Prolia  is no longer preferred for pharmacy benefit. Jubbonti is now preferred. PRICING IS FOR JUBBONTI  Pharmacy benefit: Copay $0 (Paid to pharmacy) Admin Fee: 20% (Pay at clinic)  Prior Auth: N/A PA# Expiration Date:   # of doses approved:   If patient wants fill through the pharmacy benefit please send prescription to: [facility], and include estimated need by date in rx notes. Pharmacy will ship medication directly to the office.  Patient NOT eligible for Prolia  Copay Card. Copay Card can make patient's cost as little as $25. Link to apply: [email or web address]://[email or web address].[email or web address]/copay  ** This summary of benefits is an estimation of the patient's out-of-pocket cost. Exact cost may very based on individual plan coverage.

## 2024-05-23 NOTE — Telephone Encounter (Signed)
 SABRA

## 2024-06-20 ENCOUNTER — Ambulatory Visit: Admitting: Family Medicine

## 2024-06-20 ENCOUNTER — Ambulatory Visit

## 2024-06-21 ENCOUNTER — Telehealth: Payer: Self-pay | Admitting: Family Medicine

## 2024-06-21 NOTE — Telephone Encounter (Signed)
 Pt is scheduled for a prolia  injection on 06/27/24. Her copay is $357. Says she spoke with her insurance company and they are requesting a call from the provider to justify why patient needs this injection (possibly a PA is needed). Patient is requesting a call with an update prior to the appointment date. Patient speaks limited english

## 2024-06-26 NOTE — Telephone Encounter (Signed)
 Attempted to reach patient using language line interpreter [phone] to let patient know her Prolia  has been sent for Prior Authorization and is approved. She owes $357 due to coinsurance of 20%. Left voicemail for patient to return call.

## 2024-06-27 ENCOUNTER — Ambulatory Visit: Admitting: Family Medicine

## 2024-06-27 ENCOUNTER — Ambulatory Visit

## 2024-06-27 ENCOUNTER — Encounter: Payer: Self-pay | Admitting: Family Medicine

## 2024-06-27 VITALS — BP 138/80 | HR 74 | Temp 97.9°F | Resp 16 | Ht <= 58 in | Wt 150.2 lb

## 2024-06-27 DIAGNOSIS — M8000XD Age-related osteoporosis with current pathological fracture, unspecified site, subsequent encounter for fracture with routine healing: Secondary | ICD-10-CM

## 2024-06-27 NOTE — Progress Notes (Addendum)
 "  Chief Complaint  Patient presents with   Medical Management of Chronic Issues    Discuss Prolia  options    Discussed the use of AI scribe software for clinical note transcription with the patient, who gave verbal consent to proceed. History of Present Illness Jodi [name] is an 78 year old female a PMHx significant for HTN, OSA, GERD, DM II, HLD, osteoporosis, vitamin D  deficiency, and iron deficiency anemia here today to discuss treatment for osteoporosis. She was seen on 04/03/24.  DEXA 10/27/23. She took Fosamax  for more than three years and received her first dose of Prolia  in January after authorization from her previous insurance, [employer]. She tolerated well , no side effects.  Recently, she changed her insurance to [place] due to dental implant coverage issues. According to pt, [employer] requires a letter from pcp to authorize Prolia , similar to the process with [employer]. She was informed that with Medicaid and Medicare, the insurance would cover the injection, but a letter is still needed to confirm the necessity of the injection.  Lab Results  Component Value Date   NA 139 04/03/2024   CL 103 04/03/2024   K 3.6 04/03/2024   CO2 27 04/03/2024   BUN 18 04/03/2024   CREATININE 0.57 04/03/2024   GFR 85.79 04/03/2024   CALCIUM  9.6 04/03/2024   PHOS 3.1 10/15/2017   ALBUMIN 4.2 10/28/2023   GLUCOSE 99 04/03/2024   Lab Results  Component Value Date   VD25OH 38.65 10/28/2023    She is currently taking calcium  with vitamin D  as prescribed.  She feels well and has not experienced any recent falls. She states, 'thanks to God, I feel well.'  Review of Systems  Constitutional:  Negative for activity change, appetite change and fever.  HENT:  Negative for sore throat and trouble swallowing.   Respiratory:  Negative for cough and shortness of breath.   Cardiovascular:  Negative for chest pain, palpitations and leg swelling.  Gastrointestinal:  Negative for abdominal  pain, nausea and vomiting.  Musculoskeletal:  Positive for arthralgias. Negative for gait problem.  Skin:  Negative for rash.  Neurological:  Negative for syncope, weakness and headaches.  See other pertinent positives and negatives in HPI.  Current Outpatient Medications on File Prior to Visit  Medication Sig Dispense Refill   amLODipine  (NORVASC ) 5 MG tablet TOME MEDIA TABLETA DIARIAMENTE POR VIA ORAL 45 tablet 3   atorvastatin  (LIPITOR) 10 MG tablet Take 1 tablet (10 mg total) by mouth daily. 90 tablet 3   CALCIUM  PO Take 1 tablet by mouth daily.     cholecalciferol  (VITAMIN D3) 25 MCG (1000 UNIT) tablet Take 1,000 Units by mouth daily.     cyanocobalamin  (VITAMIN B12) 1000 MCG tablet Take 1,000 mcg by mouth daily.     DULoxetine  (CYMBALTA ) 30 MG capsule Take 1 capsule (30 mg total) by mouth daily. 30 capsule 3   ferrous sulfate  325 (65 FE) MG tablet Take 1 tablet (325 mg total) by mouth daily with breakfast. 90 tablet 1   fluticasone  (FLONASE ) 50 MCG/ACT nasal spray Place 2 sprays into both nostrils daily.     fluticasone  (VERAMYST) 27.5 MCG/SPRAY nasal spray Place 1 spray into the nose daily as needed for rhinitis.     GEMTESA  75 MG TABS Take 1 tablet (75 mg total) by mouth daily. 30 tablet 11   hydrochlorothiazide  (HYDRODIURIL ) 12.5 MG tablet TOME 1 TABLETA POR VIA ORAL TODOS LOS DIAS 90 tablet 3   loratadine  (CLARITIN ) 10 MG tablet  Take 10 mg by mouth daily.     meclizine  (ANTIVERT ) 12.5 MG tablet Take 12.5 mg by mouth daily as needed for dizziness.     meloxicam  (MOBIC ) 7.5 MG tablet TOME UNA TABLETA POR VIA ORAL A DIARIO CUANDO SEA NECESARIO PARA EL DOLOR 90 tablet 1   Omega-3 Fatty Acids (FISH OIL) 1000 MG CAPS Take 1,000 mg by mouth daily.     omeprazole  (PRILOSEC) 40 MG capsule TOME 1 CAPSULA POR VIA ORAL TODOS LOS DIAS 90 capsule 2   sennosides-docusate sodium  (SENOKOT-S) 8.6-50 MG tablet Take 1 tablet by mouth at bedtime as needed for constipation.     triamcinolone  cream  (KENALOG ) 0.1 % APLIQUE AL AREA AFECTADA DOS VECES AL DIA (Patient taking differently: Apply 1 Application topically daily as needed (irritation).) 30 g 1   vitamin E 180 MG (400 UNITS) capsule Take 400 Units by mouth daily.     Current Facility-Administered Medications on File Prior to Visit  Medication Dose Route Frequency Provider Last Rate Last Admin   denosumab  (PROLIA ) injection 60 mg  60 mg Subcutaneous Q6 months Euel Castile G, MD   60 mg at 12/20/23 1504   denosumab  (PROLIA ) injection 60 mg  60 mg Subcutaneous Q6 months Cheyne Bungert G, MD        Past Medical History:  Diagnosis Date   Anemia    Arthritis    Diabetes mellitus without complication (HCC)    History of kidney stones    Hypertension    Insomnia    Osteoporosis    Polio    in childhood   Sleep apnea     Allergies  Allergen Reactions   Porcine (Pork) Protein-Containing Drug Products     Rash, joint pain   Trospium  Swelling   Shellfish Allergy Rash and Other (See Comments)    joint pain, GI pain    Social History   Socioeconomic History   Marital status: Divorced    Spouse name: Not on file   Number of children: Not on file   Years of education: Not on file   Highest education level: Not on file  Occupational History   Not on file  Tobacco Use   Smoking status: Never   Smokeless tobacco: Never  Vaping Use   Vaping status: Never Used  Substance and Sexual Activity   Alcohol use: Never   Drug use: Never   Sexual activity: Not Currently    Partners: Male  Other Topics Concern   Not on file  Social History Narrative   ** Merged History Encounter **    Caffiene; 1-cup coffee daily   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    Social Drivers of Corporate Investment Banker Strain: Low Risk  (04/16/2022)   Overall Financial Resource Strain (CARDIA)    Difficulty of Paying Living Expenses: Not hard at all  Food Insecurity: No Food Insecurity (04/16/2022)   Hunger Vital Sign    Worried  About Running Out of Food in the Last Year: Never true    Ran Out of Food in the Last Year: Never true  Transportation Needs: No Transportation Needs (04/16/2022)   PRAPARE - Administrator, Civil Service (Medical): No    Lack of Transportation (Non-Medical): No  Physical Activity: Sufficiently Active (04/16/2022)   Exercise Vital Sign    Days of Exercise per Week: 5 days    Minutes of Exercise per Session: 60 min  Stress: No Stress Concern Present (04/16/2022)  [employer] of Occupational Health - Occupational Stress Questionnaire    Feeling of Stress : Not at all  Social Connections: Moderately Integrated (04/16/2022)   Social Connection and Isolation Panel    Frequency of Communication with Friends and Family: More than three times a week    Frequency of Social Gatherings with Friends and Family: More than three times a week    Attends Religious Services: More than 4 times per year    Active Member of [employer] or Organizations: Yes    Attends Engineer, Structural: More than 4 times per year    Marital Status: Divorced    Today's Vitals   06/27/24 1436  BP: 138/80  Pulse: 74  Resp: 16  Temp: 97.9 F (36.6 C)  SpO2: 96%  Weight: 150 lb 3.2 oz (68.1 kg)  Height: 4' 10 (1.473 m)   Body mass index is 31.39 kg/m.  Physical Exam Vitals and nursing note reviewed.  Constitutional:      General: She is not in acute distress.    Appearance: She is well-developed.  HENT:     Head: Normocephalic and atraumatic.  Eyes:     Conjunctiva/sclera: Conjunctivae normal.  Cardiovascular:     Rate and Rhythm: Normal rate and regular rhythm.     Heart sounds: No murmur heard. Pulmonary:     Effort: Pulmonary effort is normal. No respiratory distress.     Breath sounds: Normal breath sounds.  Skin:    General: Skin is warm.     Findings: No erythema or rash.  Neurological:     General: No focal deficit present.     Mental Status: She is alert and oriented to  person, place, and time.     Gait: Gait normal.  Psychiatric:        Mood and Affect: Mood and affect normal.   ASSESSMENT AND PLAN:  Ms. Allani Reber was seen today for medical management of chronic issues.  Diagnoses and all orders for this visit:  Osteoporosis with current pathological fracture with routine healing, unspecified osteoporosis type, subsequent encounter She took Fosamax  for over 5 years, so Prolia  was recommended. She took one dose of Prolia , well tolerated. She since changed health insurance, which required a PA process. Discussed case with PA team and was told that medication has been approved by her insurance but she has a copay.  She is going to call her health insurance again to clarify coverage. Continue adequate calcium  and vit D intake as well as fall precautions and regular physical activity.  Return if symptoms worsen or fail to improve.  Marik Sedore, MD [facility]. [place] [facility].   "

## 2024-06-27 NOTE — Patient Instructions (Signed)
 A few things to remember from today's visit:  Osteoporosis with current pathological fracture with routine healing, unspecified osteoporosis type, subsequent encounter  Iyo voy a mardar la carta per no estoy segura si va a cambiar el precio. Prolia  ya fue aprovada pero con un copay de 20%

## 2024-06-29 ENCOUNTER — Other Ambulatory Visit: Payer: Self-pay

## 2024-06-29 DIAGNOSIS — M8000XD Age-related osteoporosis with current pathological fracture, unspecified site, subsequent encounter for fracture with routine healing: Secondary | ICD-10-CM

## 2024-06-29 DIAGNOSIS — M159 Polyosteoarthritis, unspecified: Secondary | ICD-10-CM

## 2024-06-29 MED ORDER — DENOSUMAB-BBDZ 60 MG/ML ~~LOC~~ SOSY: 60.0000 mg | PREFILLED_SYRINGE | Freq: Once | SUBCUTANEOUS | Status: DC

## 2024-06-29 NOTE — Telephone Encounter (Signed)
 Patient seen by Dr. Jordan and has verbalized need.

## 2024-07-15 ENCOUNTER — Other Ambulatory Visit: Payer: Self-pay | Admitting: Family Medicine

## 2024-07-15 DIAGNOSIS — M159 Polyosteoarthritis, unspecified: Secondary | ICD-10-CM

## 2024-07-24 MED ORDER — DENOSUMAB-BBDZ 60 MG/ML ~~LOC~~ SOSY
60.0000 mg | PREFILLED_SYRINGE | Freq: Once | SUBCUTANEOUS | 0 refills | Status: AC
  Filled 2024-07-25: qty 1, 180d supply, fill #0

## 2024-07-24 NOTE — Addendum Note (Signed)
 Addended by: DIONISIO COLLIE PARAS on: 07/24/2024 02:22 PM   Modules accepted: Orders

## 2024-07-25 ENCOUNTER — Other Ambulatory Visit: Payer: Self-pay

## 2024-07-25 ENCOUNTER — Other Ambulatory Visit (HOSPITAL_COMMUNITY): Payer: Self-pay

## 2024-07-25 NOTE — Progress Notes (Signed)
 Specialty Pharmacy Initial Fill Coordination Note  FREEDA SPIVEY is a 79 y.o. female contacted today regarding initial fill of specialty medication(s) Denosumab -bbdz (JUBBONTI )   Patient requested Courier to Provider Office   Delivery date: 07/27/24   Verified address: [facility] [place]  [address]   Medication will be filled on: 07/26/24   Patient is aware of $1.60 copayment.

## 2024-07-25 NOTE — Progress Notes (Signed)
 See benefits inv encounter from 05/22/24. Copay is $1.60

## 2024-07-28 ENCOUNTER — Ambulatory Visit (INDEPENDENT_AMBULATORY_CARE_PROVIDER_SITE_OTHER)

## 2024-07-28 DIAGNOSIS — M159 Polyosteoarthritis, unspecified: Secondary | ICD-10-CM

## 2024-07-28 DIAGNOSIS — M810 Age-related osteoporosis without current pathological fracture: Secondary | ICD-10-CM

## 2024-07-28 DIAGNOSIS — M8000XD Age-related osteoporosis with current pathological fracture, unspecified site, subsequent encounter for fracture with routine healing: Secondary | ICD-10-CM

## 2024-07-28 MED ORDER — DENOSUMAB-BBDZ 60 MG/ML ~~LOC~~ SOSY
60.0000 mg | PREFILLED_SYRINGE | Freq: Once | SUBCUTANEOUS | Status: AC
  Administered 2024-07-28: 60 mg via SUBCUTANEOUS

## 2024-07-28 NOTE — Progress Notes (Signed)
 Patient is in office today for a nurse visit for Jubbonti. Patient Injection was given in the  Left arm. Patient tolerated injection well.

## 2024-07-28 NOTE — Addendum Note (Signed)
 Addended by: DIONISIO COLLIE PARAS on: 07/28/2024 08:39 AM   Modules accepted: Orders

## 2024-07-31 ENCOUNTER — Encounter: Payer: Self-pay | Admitting: Family Medicine

## 2024-07-31 ENCOUNTER — Ambulatory Visit: Admitting: Family Medicine

## 2024-07-31 VITALS — BP 140/85 | HR 84 | Temp 98.0°F | Resp 16 | Ht <= 58 in | Wt 149.4 lb

## 2024-07-31 DIAGNOSIS — E1169 Type 2 diabetes mellitus with other specified complication: Secondary | ICD-10-CM | POA: Diagnosis not present

## 2024-07-31 DIAGNOSIS — M159 Polyosteoarthritis, unspecified: Secondary | ICD-10-CM

## 2024-07-31 DIAGNOSIS — I10 Essential (primary) hypertension: Secondary | ICD-10-CM

## 2024-07-31 DIAGNOSIS — E785 Hyperlipidemia, unspecified: Secondary | ICD-10-CM | POA: Diagnosis not present

## 2024-07-31 LAB — POCT GLYCOSYLATED HEMOGLOBIN (HGB A1C): Hemoglobin A1C: 5.9 % — AB (ref 4.0–5.6)

## 2024-07-31 MED ORDER — DULOXETINE HCL 30 MG PO CPEP: 30.0000 mg | ORAL_CAPSULE | Freq: Every day | ORAL | 2 refills | Status: AC

## 2024-07-31 NOTE — Patient Instructions (Signed)
 A few things to remember from today's visit:  Type 2 diabetes mellitus with other specified complication, without long-term current use of insulin (HCC) - Plan: POC HgB A1c  HTN (hypertension), benign  Generalized osteoarthritis of multiple sites Siga tomando los mismos medicamentos. La veo en 6 meses.  If you need refills for medications you take chronically, please call your pharmacy. Do not use My Chart to request refills or for acute issues that need immediate attention. If you send a my chart message, it may take a few days to be addressed, specially if I am not in the office.  Please be sure medication list is accurate. If a new problem present, please set up appointment sooner than planned today.

## 2024-07-31 NOTE — Assessment & Plan Note (Signed)
 BP mildly elevated today, improved after a few minutes. Reporting home BPs under 140/90 most of the time. Continue amlodipine  5 mg 1/2 tablet daily and HCTZ 12.5 mg daily as well as low-salt diet. Continue monitoring BP at regularly. She has an eye exam already scheduled. Follow-up in 6 months.

## 2024-07-31 NOTE — Assessment & Plan Note (Signed)
 Today she is reporting great improvement in arthralgias and back pain. Continue duloxetine  30 mg daily and meloxicam  7.5 mg daily as needed. Continue fall precautions and low impact physical activity.

## 2024-07-31 NOTE — Assessment & Plan Note (Signed)
 Last LDL 96 in 03/2024. She admits to eating a good amount of butter in the morning with bread, recommend decreasing it. Continue atorvastatin  10 mg daily. Will plan on checking fasting lipid panel at next visit.

## 2024-07-31 NOTE — Progress Notes (Signed)
 "  Chief Complaint  Patient presents with   Follow-up    4 month F/U    Discussed the use of AI scribe software for clinical note transcription with the patient, who gave verbal consent to proceed. History of Present Illness Jodi [name] is an 79 year old female with a PMHx significant for hypertension, OSA, GERD, DM 2, hyperlipidemia, generalized OA, and vitamin D  deficiency who is here today for chronic disease management. Last seen on 06/27/24. Since her last visit she started treatment for osteoporosis, Prolia , which she has tolerated well.  Generalized OA: She has experienced significant improvement in her chronic back and joint pain since starting duloxetine  30 mg. She takes it at night but occasionally skips a dose if she needs to drive early in the morning due to drowsiness. Despite this, her overall pain has decreased significantly. She uses meloxicam  7.5 mg daily as needed for pain, sometimes not requiring it at all during the week.   Hypertension: She monitors her blood pressure at home, with SBP readings typically around 130's-140 mmHg. She does not check it daily.She is currently taking amlodipine  5 mg 1/2 tab daily and hydrochlorothiazide  12.5 mg daily.  Negative for unusual or severe headache, visual changes, exertional chest pain, dyspnea,  focal weakness, or edema.  Lab Results  Component Value Date   CREATININE 0.57 04/03/2024   BUN 18 04/03/2024   NA 139 04/03/2024   K 3.6 04/03/2024   CL 103 04/03/2024   CO2 27 04/03/2024   Diabetes Mellitus II: Dx'ed in 2015  She is no longer on metformin . Currently on nonpharmacologic treatment. She is mindful of her diet, particularly avoiding excessive sweets. Negative for symptoms of hypoglycemia, polyuria, polydipsia, foot ulcers/trauma History of R LE numbness, intermittent, she took gabapentin  in the past.  Lab Results  Component Value Date   HGBA1C 5.8 (A) 04/03/2024   Lab Results  Component Value Date    MICROALBUR <0.7 10/28/2023   Hyperlipidemia: Currently she is on atorvastatin  10 mg daily. She mentions that she eats bread with a good amount of butter on every morning. She avoids fried foods. Lab Results  Component Value Date   CHOL 183 04/03/2024   HDL 67.70 04/03/2024   LDLCALC 96 04/03/2024   TRIG 92.0 04/03/2024   CHOLHDL 3 04/03/2024   Review of Systems  Constitutional:  Negative for activity change, appetite change and fever.  HENT:  Negative for sore throat.   Respiratory:  Negative for cough and wheezing.   Gastrointestinal:  Negative for abdominal pain, nausea and vomiting.  Genitourinary:  Negative for decreased urine volume, dysuria and hematuria.  Skin:  Negative for rash.  Neurological:  Negative for syncope and facial asymmetry.  Psychiatric/Behavioral:  Negative for confusion.   See other pertinent positives and negatives in HPI.  Medications Ordered Prior to Encounter[1]  Past Medical History:  Diagnosis Date   Anemia    Arthritis    Diabetes mellitus without complication (HCC)    History of kidney stones    Hypertension    Insomnia    Osteoporosis    Polio    in childhood   Sleep apnea     Allergies[2]  Social History   Socioeconomic History   Marital status: Divorced    Spouse name: Not on file   Number of children: Not on file   Years of education: Not on file   Highest education level: Not on file  Occupational History   Not on file  Tobacco  Use   Smoking status: Never   Smokeless tobacco: Never  Vaping Use   Vaping status: Never Used  Substance and Sexual Activity   Alcohol use: Never   Drug use: Never   Sexual activity: Not Currently    Partners: Male  Other Topics Concern   Not on file  Social History Narrative   ** Merged History Encounter **    Caffiene; 1-cup coffee daily   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    Social Drivers of Health   Tobacco Use: Low Risk (07/31/2024)   Patient History    Smoking  Tobacco Use: Never    Smokeless Tobacco Use: Never    Passive Exposure: Not on file  Financial Resource Strain: Low Risk (04/16/2022)   Overall Financial Resource Strain (CARDIA)    Difficulty of Paying Living Expenses: Not hard at all  Food Insecurity: No Food Insecurity (04/16/2022)   Hunger Vital Sign    Worried About Running Out of Food in the Last Year: Never true    Ran Out of Food in the Last Year: Never true  Transportation Needs: No Transportation Needs (04/16/2022)   PRAPARE - Administrator, Civil Service (Medical): No    Lack of Transportation (Non-Medical): No  Physical Activity: Sufficiently Active (04/16/2022)   Exercise Vital Sign    Days of Exercise per Week: 5 days    Minutes of Exercise per Session: 60 min  Stress: No Stress Concern Present (04/16/2022)   [employer] of Occupational Health - Occupational Stress Questionnaire    Feeling of Stress : Not at all  Social Connections: Moderately Integrated (04/16/2022)   Social Connection and Isolation Panel    Frequency of Communication with Friends and Family: More than three times a week    Frequency of Social Gatherings with Friends and Family: More than three times a week    Attends Religious Services: More than 4 times per year    Active Member of Clubs or Organizations: Yes    Attends Banker Meetings: More than 4 times per year    Marital Status: Divorced  Depression (PHQ2-9): High Risk (05/06/2023)   Depression (PHQ2-9)    PHQ-2 Score: 14  Alcohol Screen: Low Risk (04/16/2022)   Alcohol Screen    Last Alcohol Screening Score (AUDIT): 0  Housing: Low Risk (04/16/2022)   Housing    Last Housing Risk Score: 0  Utilities: Not At Risk (04/16/2022)   AHC Utilities    Threatened with loss of utilities: No  Health Literacy: Not on file   Today's Vitals   07/31/24 1350 07/31/24 1446  BP: (!) 142/82 (!) 140/85  Pulse: 84   Resp: 16   Temp: 98 F (36.7 C)   SpO2: 93%   Weight: 149 lb  6.4 oz (67.8 kg)   Height: 4' 10 (1.473 m)    Body mass index is 31.22 kg/m.  Physical Exam Vitals and nursing note reviewed.  Constitutional:      General: She is not in acute distress.    Appearance: She is well-developed.  HENT:     Head: Normocephalic and atraumatic.     Mouth/Throat:     Mouth: Mucous membranes are moist.     Pharynx: Oropharynx is clear.  Eyes:     Conjunctiva/sclera: Conjunctivae normal.  Cardiovascular:     Rate and Rhythm: Normal rate and regular rhythm.     Pulses:          Dorsalis  pedis pulses are 2+ on the right side and 2+ on the left side.     Heart sounds: No murmur heard.    Comments: Trace pitting LE edema, bilateral. Pulmonary:     Effort: Pulmonary effort is normal. No respiratory distress.     Breath sounds: Normal breath sounds.  Abdominal:     Palpations: Abdomen is soft. There is no mass.     Tenderness: There is no abdominal tenderness.  Lymphadenopathy:     Cervical: No cervical adenopathy.  Skin:    General: Skin is warm.     Findings: No erythema or rash.  Neurological:     General: No focal deficit present.     Mental Status: She is alert and oriented to person, place, and time.     Gait: Gait normal.  Psychiatric:        Mood and Affect: Mood and affect normal.    ASSESSMENT AND PLAN:  Jodi [name] was seen today for follow-up.  Diagnoses and all orders for this visit:  Orders Placed This Encounter  Procedures   POC HgB A1c   Lab Results  Component Value Date   HGBA1C 5.9 (A) 07/31/2024   Type 2 diabetes mellitus with other specified complication, without long-term current use of insulin (HCC) Assessment & Plan: Problem is well-controlled, A1c today is 5.9 (5.8). Continue nonpharmacologic treatment. Annual eye exam, periodic dental and foot care to continue. F/U in 5-6 months.  Orders: -     POCT glycosylated hemoglobin (Hb A1C)  HTN (hypertension), benign Assessment & Plan: BP mildly  elevated today, improved after a few minutes. Reporting home BPs under 140/90 most of the time. Continue amlodipine  5 mg 1/2 tablet daily and HCTZ 12.5 mg daily as well as low-salt diet. Continue monitoring BP at regularly. She has an eye exam already scheduled. Follow-up in 6 months.   Generalized osteoarthritis of multiple sites Assessment & Plan: Today she is reporting great improvement in arthralgias and back pain. Continue duloxetine  30 mg daily and meloxicam  7.5 mg daily as needed. Continue fall precautions and low impact physical activity.  Orders: -     DULoxetine  HCl; Take 1 capsule (30 mg total) by mouth daily.  Dispense: 90 capsule; Refill: 2  Hyperlipidemia, unspecified hyperlipidemia type Assessment & Plan: Last LDL 96 in 03/2024. She admits to eating a good amount of butter in the morning with bread, recommend decreasing it. Continue atorvastatin  10 mg daily. Will plan on checking fasting lipid panel at next visit.   Next visit FLP,CMP, 25 OH vit D, microalb/Cr ratio, and A1C.  Return in about 6 months (around 01/26/2025) for chronic problems.  Julio Storr, MD [facility]. [place] office.      [1]  Current Outpatient Medications on File Prior to Visit  Medication Sig Dispense Refill   amLODipine  (NORVASC ) 5 MG tablet TOME MEDIA TABLETA DIARIAMENTE POR VIA ORAL 45 tablet 3   atorvastatin  (LIPITOR) 10 MG tablet Take 1 tablet (10 mg total) by mouth daily. 90 tablet 3   CALCIUM  PO Take 1 tablet by mouth daily.     cholecalciferol  (VITAMIN D3) 25 MCG (1000 UNIT) tablet Take 1,000 Units by mouth daily.     cyanocobalamin  (VITAMIN B12) 1000 MCG tablet Take 1,000 mcg by mouth daily.     ferrous sulfate  325 (65 FE) MG tablet Take 1 tablet (325 mg total) by mouth daily with breakfast. 90 tablet 1   fluticasone  (FLONASE ) 50 MCG/ACT nasal spray Place 2 sprays  into both nostrils daily.     fluticasone  (VERAMYST) 27.5 MCG/SPRAY nasal spray Place 1 spray into  the nose daily as needed for rhinitis.     GEMTESA  75 MG TABS Take 1 tablet (75 mg total) by mouth daily. 30 tablet 11   hydrochlorothiazide  (HYDRODIURIL ) 12.5 MG tablet TOME 1 TABLETA POR VIA ORAL TODOS LOS DIAS 90 tablet 3   loratadine  (CLARITIN ) 10 MG tablet Take 10 mg by mouth daily.     meloxicam  (MOBIC ) 7.5 MG tablet TOME UNA TABLETA POR VIA ORAL A DIARIO CUANDO SEA NECESARIO PARA EL DOLOR 90 tablet 1   Omega-3 Fatty Acids (FISH OIL) 1000 MG CAPS Take 1,000 mg by mouth daily.     omeprazole  (PRILOSEC) 40 MG capsule TOME 1 CAPSULA POR VIA ORAL TODOS LOS DIAS 90 capsule 2   sennosides-docusate sodium  (SENOKOT-S) 8.6-50 MG tablet Take 1 tablet by mouth at bedtime as needed for constipation.     triamcinolone  cream (KENALOG ) 0.1 % APLIQUE AL AREA AFECTADA DOS VECES AL DIA 30 g 1   vitamin E 180 MG (400 UNITS) capsule Take 400 Units by mouth daily.     No current facility-administered medications on file prior to visit.  [2]  Allergies Allergen Reactions   Porcine (Pork) Protein-Containing Drug Products     Rash, joint pain   Trospium  Swelling   Shellfish Allergy Rash and Other (See Comments)    joint pain, GI pain   "

## 2024-07-31 NOTE — Assessment & Plan Note (Signed)
 Problem is well-controlled, A1c today is 5.9 (5.8). Continue nonpharmacologic treatment. Annual eye exam, periodic dental and foot care to continue. F/U in 5-6 months.

## 2024-08-16 ENCOUNTER — Other Ambulatory Visit: Payer: Self-pay

## 2025-01-29 ENCOUNTER — Ambulatory Visit: Admitting: Family Medicine
